# Patient Record
Sex: Male | Born: 1955 | Race: Black or African American | Hispanic: No | Marital: Married | Smoking: Current every day smoker
Health system: Southern US, Community
[De-identification: ages and names within clinical notes are randomized; demographics above are authoritative.]

---

## 2021-09-21 ENCOUNTER — Inpatient Hospital Stay (HOSPITAL_BASED_OUTPATIENT_CLINIC_OR_DEPARTMENT_OTHER)
Admission: EM | Admit: 2021-09-21 | Discharge: 2021-10-30 | DRG: 004 | Disposition: A | Discharge status: Other Acute Inpatient Hospital | Payer: Medicare Other | Attending: Internal Medicine | Admitting: Internal Medicine

## 2021-09-21 ENCOUNTER — Encounter (HOSPITAL_BASED_OUTPATIENT_CLINIC_OR_DEPARTMENT_OTHER): Payer: Self-pay | Admitting: Emergency Medicine

## 2021-09-21 ENCOUNTER — Emergency Department (HOSPITAL_BASED_OUTPATIENT_CLINIC_OR_DEPARTMENT_OTHER): Payer: Medicare Other

## 2021-09-21 ENCOUNTER — Other Ambulatory Visit: Payer: Self-pay

## 2021-09-21 DIAGNOSIS — R160 Hepatomegaly, not elsewhere classified: Secondary | ICD-10-CM | POA: Diagnosis present

## 2021-09-21 DIAGNOSIS — A403 Sepsis due to Streptococcus pneumoniae: Secondary | ICD-10-CM | POA: Diagnosis not present

## 2021-09-21 DIAGNOSIS — E785 Hyperlipidemia, unspecified: Secondary | ICD-10-CM | POA: Diagnosis present

## 2021-09-21 DIAGNOSIS — B9689 Other specified bacterial agents as the cause of diseases classified elsewhere: Secondary | ICD-10-CM | POA: Diagnosis present

## 2021-09-21 DIAGNOSIS — R001 Bradycardia, unspecified: Secondary | ICD-10-CM | POA: Diagnosis not present

## 2021-09-21 DIAGNOSIS — I10 Essential (primary) hypertension: Secondary | ICD-10-CM | POA: Diagnosis present

## 2021-09-21 DIAGNOSIS — K7460 Unspecified cirrhosis of liver: Secondary | ICD-10-CM | POA: Diagnosis present

## 2021-09-21 DIAGNOSIS — Z4659 Encounter for fitting and adjustment of other gastrointestinal appliance and device: Secondary | ICD-10-CM

## 2021-09-21 DIAGNOSIS — R64 Cachexia: Secondary | ICD-10-CM | POA: Diagnosis present

## 2021-09-21 DIAGNOSIS — D696 Thrombocytopenia, unspecified: Secondary | ICD-10-CM | POA: Diagnosis present

## 2021-09-21 DIAGNOSIS — K567 Ileus, unspecified: Secondary | ICD-10-CM

## 2021-09-21 DIAGNOSIS — Z930 Tracheostomy status: Secondary | ICD-10-CM

## 2021-09-21 DIAGNOSIS — J80 Acute respiratory distress syndrome: Secondary | ICD-10-CM | POA: Diagnosis not present

## 2021-09-21 DIAGNOSIS — A419 Sepsis, unspecified organism: Secondary | ICD-10-CM

## 2021-09-21 DIAGNOSIS — F419 Anxiety disorder, unspecified: Secondary | ICD-10-CM | POA: Diagnosis not present

## 2021-09-21 DIAGNOSIS — F17210 Nicotine dependence, cigarettes, uncomplicated: Secondary | ICD-10-CM | POA: Diagnosis present

## 2021-09-21 DIAGNOSIS — B1920 Unspecified viral hepatitis C without hepatic coma: Secondary | ICD-10-CM | POA: Diagnosis present

## 2021-09-21 DIAGNOSIS — D6489 Other specified anemias: Secondary | ICD-10-CM | POA: Diagnosis present

## 2021-09-21 DIAGNOSIS — E876 Hypokalemia: Secondary | ICD-10-CM | POA: Diagnosis not present

## 2021-09-21 DIAGNOSIS — Z66 Do not resuscitate: Secondary | ICD-10-CM | POA: Diagnosis not present

## 2021-09-21 DIAGNOSIS — K921 Melena: Secondary | ICD-10-CM | POA: Diagnosis not present

## 2021-09-21 DIAGNOSIS — I214 Non-ST elevation (NSTEMI) myocardial infarction: Secondary | ICD-10-CM

## 2021-09-21 DIAGNOSIS — Z0189 Encounter for other specified special examinations: Secondary | ICD-10-CM

## 2021-09-21 DIAGNOSIS — R7881 Bacteremia: Secondary | ICD-10-CM | POA: Diagnosis present

## 2021-09-21 DIAGNOSIS — G9341 Metabolic encephalopathy: Secondary | ICD-10-CM | POA: Diagnosis present

## 2021-09-21 DIAGNOSIS — J9600 Acute respiratory failure, unspecified whether with hypoxia or hypercapnia: Secondary | ICD-10-CM

## 2021-09-21 DIAGNOSIS — K7200 Acute and subacute hepatic failure without coma: Secondary | ICD-10-CM | POA: Diagnosis not present

## 2021-09-21 DIAGNOSIS — E43 Unspecified severe protein-calorie malnutrition: Secondary | ICD-10-CM | POA: Diagnosis present

## 2021-09-21 DIAGNOSIS — Z9911 Dependence on respirator [ventilator] status: Secondary | ICD-10-CM

## 2021-09-21 DIAGNOSIS — R6521 Severe sepsis with septic shock: Secondary | ICD-10-CM | POA: Diagnosis present

## 2021-09-21 DIAGNOSIS — D638 Anemia in other chronic diseases classified elsewhere: Secondary | ICD-10-CM | POA: Diagnosis present

## 2021-09-21 DIAGNOSIS — E871 Hypo-osmolality and hyponatremia: Secondary | ICD-10-CM | POA: Diagnosis not present

## 2021-09-21 DIAGNOSIS — J690 Pneumonitis due to inhalation of food and vomit: Secondary | ICD-10-CM | POA: Diagnosis present

## 2021-09-21 DIAGNOSIS — M109 Gout, unspecified: Secondary | ICD-10-CM | POA: Diagnosis present

## 2021-09-21 DIAGNOSIS — J13 Pneumonia due to Streptococcus pneumoniae: Secondary | ICD-10-CM | POA: Diagnosis present

## 2021-09-21 DIAGNOSIS — I21A1 Myocardial infarction type 2: Secondary | ICD-10-CM | POA: Diagnosis present

## 2021-09-21 DIAGNOSIS — J9601 Acute respiratory failure with hypoxia: Principal | ICD-10-CM

## 2021-09-21 DIAGNOSIS — R7989 Other specified abnormal findings of blood chemistry: Secondary | ICD-10-CM

## 2021-09-21 DIAGNOSIS — K922 Gastrointestinal hemorrhage, unspecified: Secondary | ICD-10-CM

## 2021-09-21 DIAGNOSIS — D62 Acute posthemorrhagic anemia: Secondary | ICD-10-CM | POA: Diagnosis not present

## 2021-09-21 DIAGNOSIS — J9811 Atelectasis: Secondary | ICD-10-CM | POA: Diagnosis not present

## 2021-09-21 DIAGNOSIS — Z452 Encounter for adjustment and management of vascular access device: Secondary | ICD-10-CM

## 2021-09-21 DIAGNOSIS — B182 Chronic viral hepatitis C: Secondary | ICD-10-CM | POA: Diagnosis present

## 2021-09-21 DIAGNOSIS — Z20822 Contact with and (suspected) exposure to covid-19: Secondary | ICD-10-CM | POA: Diagnosis present

## 2021-09-21 DIAGNOSIS — E878 Other disorders of electrolyte and fluid balance, not elsewhere classified: Secondary | ICD-10-CM | POA: Diagnosis present

## 2021-09-21 DIAGNOSIS — E870 Hyperosmolality and hypernatremia: Secondary | ICD-10-CM | POA: Diagnosis not present

## 2021-09-21 DIAGNOSIS — M1711 Unilateral primary osteoarthritis, right knee: Secondary | ICD-10-CM | POA: Diagnosis present

## 2021-09-21 DIAGNOSIS — R609 Edema, unspecified: Secondary | ICD-10-CM

## 2021-09-21 DIAGNOSIS — R339 Retention of urine, unspecified: Secondary | ICD-10-CM | POA: Diagnosis not present

## 2021-09-21 DIAGNOSIS — D61818 Other pancytopenia: Secondary | ICD-10-CM | POA: Diagnosis present

## 2021-09-21 DIAGNOSIS — J189 Pneumonia, unspecified organism: Secondary | ICD-10-CM

## 2021-09-21 DIAGNOSIS — K2211 Ulcer of esophagus with bleeding: Secondary | ICD-10-CM | POA: Diagnosis present

## 2021-09-21 DIAGNOSIS — R0600 Dyspnea, unspecified: Secondary | ICD-10-CM

## 2021-09-21 DIAGNOSIS — J90 Pleural effusion, not elsewhere classified: Secondary | ICD-10-CM | POA: Diagnosis not present

## 2021-09-21 DIAGNOSIS — Z01818 Encounter for other preprocedural examination: Secondary | ICD-10-CM

## 2021-09-21 DIAGNOSIS — R0602 Shortness of breath: Secondary | ICD-10-CM

## 2021-09-21 DIAGNOSIS — D649 Anemia, unspecified: Secondary | ICD-10-CM | POA: Diagnosis present

## 2021-09-21 DIAGNOSIS — J398 Other specified diseases of upper respiratory tract: Secondary | ICD-10-CM

## 2021-09-21 DIAGNOSIS — E46 Unspecified protein-calorie malnutrition: Secondary | ICD-10-CM

## 2021-09-21 DIAGNOSIS — K2101 Gastro-esophageal reflux disease with esophagitis, with bleeding: Secondary | ICD-10-CM | POA: Diagnosis present

## 2021-09-21 DIAGNOSIS — J9690 Respiratory failure, unspecified, unspecified whether with hypoxia or hypercapnia: Secondary | ICD-10-CM

## 2021-09-21 DIAGNOSIS — L8990 Pressure ulcer of unspecified site, unspecified stage: Secondary | ICD-10-CM | POA: Diagnosis present

## 2021-09-21 DIAGNOSIS — D509 Iron deficiency anemia, unspecified: Secondary | ICD-10-CM | POA: Diagnosis present

## 2021-09-21 DIAGNOSIS — R0682 Tachypnea, not elsewhere classified: Secondary | ICD-10-CM

## 2021-09-21 DIAGNOSIS — Z978 Presence of other specified devices: Secondary | ICD-10-CM

## 2021-09-21 DIAGNOSIS — B953 Streptococcus pneumoniae as the cause of diseases classified elsewhere: Secondary | ICD-10-CM | POA: Diagnosis present

## 2021-09-21 DIAGNOSIS — R0902 Hypoxemia: Secondary | ICD-10-CM

## 2021-09-21 DIAGNOSIS — R739 Hyperglycemia, unspecified: Secondary | ICD-10-CM | POA: Diagnosis not present

## 2021-09-21 DIAGNOSIS — E861 Hypovolemia: Secondary | ICD-10-CM | POA: Diagnosis not present

## 2021-09-21 DIAGNOSIS — Z515 Encounter for palliative care: Secondary | ICD-10-CM

## 2021-09-21 DIAGNOSIS — Z681 Body mass index (BMI) 19 or less, adult: Secondary | ICD-10-CM

## 2021-09-21 DIAGNOSIS — J440 Chronic obstructive pulmonary disease with acute lower respiratory infection: Secondary | ICD-10-CM | POA: Diagnosis present

## 2021-09-21 DIAGNOSIS — E8729 Other acidosis: Secondary | ICD-10-CM | POA: Diagnosis not present

## 2021-09-21 DIAGNOSIS — R54 Age-related physical debility: Secondary | ICD-10-CM | POA: Diagnosis present

## 2021-09-21 DIAGNOSIS — E875 Hyperkalemia: Secondary | ICD-10-CM | POA: Diagnosis not present

## 2021-09-21 LAB — I-STAT ARTERIAL BLOOD GAS, ED
Acid-Base Excess: 1 mmol/L (ref 0.0–2.0)
Acid-base deficit: 1 mmol/L (ref 0.0–2.0)
Bicarbonate: 22.7 mmol/L (ref 20.0–28.0)
Bicarbonate: 25 mmol/L (ref 20.0–28.0)
Calcium, Ion: 1.23 mmol/L (ref 1.15–1.40)
Calcium, Ion: 1.21 mmol/L (ref 1.15–1.40)
HCT: 40 % (ref 39.0–52.0)
HCT: 37 % — ABNORMAL LOW (ref 39.0–52.0)
Hemoglobin: 13.6 g/dL (ref 13.0–17.0)
Hemoglobin: 12.6 g/dL — ABNORMAL LOW (ref 13.0–17.0)
O2 Saturation: 98 %
O2 Saturation: 97 %
Patient temperature: 101
Patient temperature: 99.1
Potassium: 3.3 mmol/L — ABNORMAL LOW (ref 3.5–5.1)
Potassium: 3.2 mmol/L — ABNORMAL LOW (ref 3.5–5.1)
Sodium: 132 mmol/L — ABNORMAL LOW (ref 135–145)
Sodium: 132 mmol/L — ABNORMAL LOW (ref 135–145)
TCO2: 24 mmol/L (ref 22–32)
TCO2: 26 mmol/L (ref 22–32)
pCO2 arterial: 34.9 mmHg (ref 32–48)
pCO2 arterial: 36.6 mmHg (ref 32–48)
pH, Arterial: 7.427 (ref 7.35–7.45)
pH, Arterial: 7.443 (ref 7.35–7.45)
pO2, Arterial: 101 mmHg (ref 83–108)
pO2, Arterial: 83 mmHg (ref 83–108)

## 2021-09-21 LAB — COMPREHENSIVE METABOLIC PANEL
ALT: 78 U/L — ABNORMAL HIGH (ref 0–44)
AST: 178 U/L — ABNORMAL HIGH (ref 15–41)
Albumin: 2.7 g/dL — ABNORMAL LOW (ref 3.5–5.0)
Alkaline Phosphatase: 50 U/L (ref 38–126)
Anion gap: 14 (ref 5–15)
BUN: 28 mg/dL — ABNORMAL HIGH (ref 8–23)
CO2: 21 mmol/L — ABNORMAL LOW (ref 22–32)
Calcium: 9.1 mg/dL (ref 8.9–10.3)
Chloride: 94 mmol/L — ABNORMAL LOW (ref 98–111)
Creatinine, Ser: 0.93 mg/dL (ref 0.61–1.24)
GFR, Estimated: >60 mL/min (ref >60)
Glucose, Bld: 121 mg/dL — ABNORMAL HIGH (ref 70–99)
Potassium: 3.3 mmol/L — ABNORMAL LOW (ref 3.5–5.1)
Sodium: 129 mmol/L — ABNORMAL LOW (ref 135–145)
Total Bilirubin: 1.9 mg/dL — ABNORMAL HIGH (ref 0.3–1.2)
Total Protein: 7.4 g/dL (ref 6.5–8.1)

## 2021-09-21 LAB — LACTIC ACID, PLASMA
Lactic Acid, Venous: 4.3 mmol/L (ref 0.5–1.9)
Lactic Acid, Venous: 2.9 mmol/L (ref 0.5–1.9)

## 2021-09-21 LAB — APTT: aPTT: 25 seconds (ref 24–36)

## 2021-09-21 LAB — RESP PANEL BY RT-PCR (FLU A&B, COVID) ARPGX2
Influenza A by PCR: NEGATIVE
Influenza B by PCR: NEGATIVE
SARS Coronavirus 2 by RT PCR: NEGATIVE

## 2021-09-21 LAB — PROTIME-INR
INR: 1 (ref 0.8–1.2)
Prothrombin Time: 13.5 seconds (ref 11.4–15.2)

## 2021-09-21 MED ORDER — ACETAMINOPHEN 500 MG PO TABS: 1000.0000 mg | ORAL_TABLET | Freq: Once | ORAL | Status: DC

## 2021-09-21 MED ORDER — SODIUM CHLORIDE 0.9 % IV BOLUS
500.0000 mL | Freq: Once | INTRAVENOUS | Status: AC
  Administered 2021-09-21: 500 mL via INTRAVENOUS

## 2021-09-21 MED ORDER — LORAZEPAM 2 MG/ML IJ SOLN
1.0000 mg | Freq: Once | INTRAMUSCULAR | Status: AC
  Administered 2021-09-21: 1 mg via INTRAVENOUS
  Filled 2021-09-21: qty 1

## 2021-09-21 MED ORDER — ACETAMINOPHEN 650 MG RE SUPP
650.0000 mg | Freq: Once | RECTAL | Status: AC
  Administered 2021-09-21: 650 mg via RECTAL
  Filled 2021-09-21: qty 1

## 2021-09-21 MED ORDER — SODIUM CHLORIDE 0.9 % IV SOLN
500.0000 mg | INTRAVENOUS | Status: DC
  Administered 2021-09-21: 500 mg via INTRAVENOUS
  Filled 2021-09-21: qty 5

## 2021-09-21 MED ORDER — SODIUM CHLORIDE 0.9 % IV SOLN
2.0000 g | INTRAVENOUS | Status: DC
  Administered 2021-09-21 – 2021-09-22 (×2): 2 g via INTRAVENOUS
  Filled 2021-09-21 (×2): qty 20

## 2021-09-21 MED ORDER — LACTATED RINGERS IV BOLUS
1000.0000 mL | Freq: Once | INTRAVENOUS | Status: AC
  Administered 2021-09-21: 1000 mL via INTRAVENOUS

## 2021-09-21 NOTE — ED Triage Notes (Signed)
Pt arrives pov with c/o shob x 1 week, worsening today. 101.8 axillary temp in triage

## 2021-09-21 NOTE — Sepsis Progress Note (Signed)
Following for sepsis monitoring ?

## 2021-09-21 NOTE — ED Provider Notes (Signed)
[place] EMERGENCY DEPARTMENT Provider Note   CSN: [id number] Arrival date & time: 09/21/21  1837     History  Chief Complaint  Patient presents with   Shortness of Breath    Troy Sampson is a 66 y.o. male.  Patient is a 66 year old male who presents with shortness of breath.  Per chart review, he has a history of duodenal ulcers status post GI bleed and iron deficiency anemia.  He denies any past history of lung disease or cardiac problems.  He reports a 1 week history of worsening shortness of breath.  He has felt generally weak.  He has some pain in his left chest which is worse with movement and deep breathing.  He says he has a cough which is productive of some brown sputum.  He is noted to have a fever of 101.8 here in the ED.  He denies any leg swelling.  No vomiting or diarrhea.  He has noted some orange-colored stools.      Home Medications Prior to Admission medications   Not on File      Allergies    Patient has no allergy information on record.    Review of Systems   Review of Systems  Constitutional:  Positive for fatigue and fever. Negative for chills and diaphoresis.  HENT:  Negative for congestion, rhinorrhea and sneezing.   Eyes: Negative.   Respiratory:  Positive for cough and shortness of breath. Negative for chest tightness.   Cardiovascular:  Positive for chest pain. Negative for leg swelling.  Gastrointestinal:  Negative for abdominal pain, blood in stool, diarrhea, nausea and vomiting.  Genitourinary:  Negative for difficulty urinating, flank pain, frequency and hematuria.  Musculoskeletal:  Negative for arthralgias and back pain.  Skin:  Negative for rash.  Neurological:  Negative for dizziness, speech difficulty, weakness, numbness and headaches.   Physical Exam Updated Vital Signs BP 133/86    Pulse (!) 135    Temp 99.8 F (37.7 C)    Resp (!) 33    Ht 5\' 7"  (1.702 m)    Wt 46.3 kg    SpO2 98%    BMI 15.98 kg/m  Physical  Exam Constitutional:      General: He is in acute distress.     Appearance: He is well-developed. He is ill-appearing.  HENT:     Head: Normocephalic and atraumatic.  Eyes:     Pupils: Pupils are equal, round, and reactive to light.  Cardiovascular:     Rate and Rhythm: Normal rate and regular rhythm.     Heart sounds: Normal heart sounds.  Pulmonary:     Effort: Tachypnea, accessory muscle usage and respiratory distress present.     Breath sounds: Rhonchi present. No wheezing or rales.  Chest:     Chest wall: No tenderness.  Abdominal:     General: Bowel sounds are normal.     Palpations: Abdomen is soft.     Tenderness: There is no abdominal tenderness. There is no guarding or rebound.  Musculoskeletal:        General: Normal range of motion.     Cervical back: Normal range of motion and neck supple.     Comments: No edema or calf tenderness  Lymphadenopathy:     Cervical: No cervical adenopathy.  Skin:    General: Skin is warm and dry.     Findings: No rash.  Neurological:     Mental Status: He is alert and oriented to person, place,  and time.    ED Results / Procedures / Treatments   Labs (all labs ordered are listed, but only abnormal results are displayed) Labs Reviewed  LACTIC ACID, PLASMA - Abnormal; Notable for the following components:      Result Value   Lactic Acid, Venous 4.3 (*)    All other components within normal limits  LACTIC ACID, PLASMA - Abnormal; Notable for the following components:   Lactic Acid, Venous 2.9 (*)    All other components within normal limits  COMPREHENSIVE METABOLIC PANEL - Abnormal; Notable for the following components:   Sodium 129 (*)    Potassium 3.3 (*)    Chloride 94 (*)    CO2 21 (*)    Glucose, Bld 121 (*)    BUN 28 (*)    Albumin 2.7 (*)    AST 178 (*)    ALT 78 (*)    Total Bilirubin 1.9 (*)    All other components within normal limits  CBC WITH DIFFERENTIAL/PLATELET - Abnormal; Notable for the following  components:   WBC 2.5 (*)    RBC 3.98 (*)    HCT 38.1 (*)    MCH 34.9 (*)    MCHC 36.5 (*)    Platelets 76 (*)    Lymphs Abs 0.1 (*)    Monocytes Absolute 0.0 (*)    All other components within normal limits  DIC (DISSEMINATED INTRAVASCULAR COAGULATION)PANEL - Abnormal; Notable for the following components:   D-Dimer, Quant 3.59 (*)    Platelets 76 (*)    All other components within normal limits  I-STAT ARTERIAL BLOOD GAS, ED - Abnormal; Notable for the following components:   Sodium 132 (*)    Potassium 3.3 (*)    All other components within normal limits  I-STAT ARTERIAL BLOOD GAS, ED - Abnormal; Notable for the following components:   Sodium 132 (*)    Potassium 3.2 (*)    HCT 37.0 (*)    Hemoglobin 12.6 (*)    All other components within normal limits  RESP PANEL BY RT-PCR (FLU A&B, COVID) ARPGX2  CULTURE, BLOOD (ROUTINE X 2)  CULTURE, BLOOD (ROUTINE X 2)  URINE CULTURE  PROTIME-INR  APTT  URINALYSIS, ROUTINE W REFLEX MICROSCOPIC  PATHOLOGIST SMEAR REVIEW    EKG EKG Interpretation  Date/Time:  Tuesday September 21 2021 18:51:31 EST Ventricular Rate:  144 PR Interval:  68 QRS Duration: 85 QT Interval:  273 QTC Calculation: 423 R Axis:   74 Text Interpretation: Sinus tachycardia LAE, consider biatrial enlargement LVH with secondary repolarization abnormality Anterior ST elevation, probably due to LVH No old tracing to compare Confirmed by Rolan BuccoBelfi, Karem Tomaso [phone]([id number]) on 09/21/2021 8:01:09 PM  Radiology DG Chest Portable 1 View  Result Date: 09/21/2021 CLINICAL DATA:  Shortness of breath EXAM: PORTABLE CHEST 1 VIEW COMPARISON:  None. FINDINGS: There is alveolar infiltrate in the left lower lung fields with air bronchogram. Left diaphragm is obscured. There is blunting of left lateral costophrenic angle. There is no pneumothorax. IMPRESSION: There is large infiltrate in the left lower lung fields suggesting pneumonia. Follow-up studies until complete clearing occurs should  be considered to rule out any obstructing lesions in the bronchus. Left pleural effusion. Electronically Signed   By: Ernie AvenaPalani  Rathinasamy M.D.   On: 09/21/2021 19:13    Procedures Procedures    Medications Ordered in ED Medications  cefTRIAXone (ROCEPHIN) 2 g in sodium chloride 0.9 % 100 mL IVPB (0 g Intravenous Stopped 09/21/21 2020)  azithromycin (ZITHROMAX) 500  mg in sodium chloride 0.9 % 250 mL IVPB (0 mg Intravenous Stopped 09/21/21 2031)  sodium chloride 0.9 % bolus 500 mL (0 mLs Intravenous Stopped 09/21/21 2021)  LORazepam (ATIVAN) injection 1 mg (1 mg Intravenous Given 09/21/21 1952)  lactated ringers bolus 1,000 mL ( Intravenous Stopped 09/21/21 2129)  acetaminophen (TYLENOL) suppository 650 mg (650 mg Rectal Given 09/21/21 2228)    ED Course/ Medical Decision Making/ A&P                           Medical Decision Making Amount and/or Complexity of Data Reviewed Labs: ordered. Radiology: ordered. ECG/medicine tests: ordered.  Risk OTC drugs. Prescription drug management. Decision regarding hospitalization.   Patient is a 66 year old male who presents with shortness of breath.  He was noted to be markedly hypoxic on arrival with oxygen saturations in the low 80s.  He was also fairly tachypneic with respiratory rates in the 40s as well as tachycardic.  He was noted to be febrile.  Portable chest x-ray was performed and interpreted by me to show a left lower lobe pneumonia.  No evidence of pneumothorax or significant pulmonary edema.  Patient was placed on BiPAP following this.  He did get fairly anxious on the BiPAP and was given a small dose of Ativan.  He actually seemed to get a little bit worse after the Ativan so this was not repeated.  However he did seem to calm down a bit and was doing better on the BiPAP.  ABG was performed which showed a normal pH.  Normal PCO2 and normal PO2.  He was weaned down to 60% FiO2.  His labs show a leukopenia and thrombocytopenia.  His hemoglobin  appears to be normal.  These were interpreted by me.  His lactate was markedly elevated at 4.3.  He was started on septic protocol and was given a full 30 cc/kg fluid bolus as well as IV antibiotics.  His heart rate and respiratory rate seemed to improve a little bit after treatment of his fever although he still tachypneic and tachycardic.  His EKG shows a sinus tachycardia without evidence of arrhythmias.  He does appear at this point to be doing okay on the BiPAP.  He is awake and communicative.  He still has some increased work of breathing but at this point does not need intubation.  I spoke with Dr.Ismael with the pulmonary critical care team who is excepted the patient for admission to [facility].  Patient was transported by [facility].  CRITICAL CARE Performed by: Malvin Johns Total critical care time: 70 minutes Critical care time was exclusive of separately billable procedures and treating other patients. Critical care was necessary to treat or prevent imminent or life-threatening deterioration. Critical care was time spent personally by me on the following activities: development of treatment plan with patient and/or surrogate as well as nursing, discussions with consultants, evaluation of patient's response to treatment, examination of patient, obtaining history from patient or surrogate, ordering and performing treatments and interventions, ordering and review of laboratory studies, ordering and review of radiographic studies, pulse oximetry and re-evaluation of patient's condition.   Final Clinical Impression(s) / ED Diagnoses Final diagnoses:  Acute respiratory failure with hypoxia ([place])  Community acquired pneumonia of left lower lobe of lung  Sepsis, due to unspecified organism, unspecified whether acute organ dysfunction present [facility])    Rx / DC Orders ED Discharge Orders     None  Malvin Johns, MD 09/21/21 [phone]

## 2021-09-21 NOTE — Plan of Care (Addendum)
Pt with severe sepsis from [facility]LL PNA.  Thrombocytopenia worrisome for DIC, will order DIC panel.  Leukopenia worrisome for overwhelming sepsis.  RR 45 despite BIPAP, has respiratory failure, worrisome for pending need for intubation.  HR still 141.  Hypertensive though.  Have advised EDP to consider PCCM consult.  Have a strong suspicion that this guy going end up in ICU before end of night with those numbers.  If they decline, ill put him in progressive.  Regardless, have asked carelink to expedite transfer to [facility] (be that ED to ICU, ED to progressive unit, or ED to ED).  TRH will assume care on arrival to accepting facility. Until arrival, care as per EDP. However, TRH available 24/7 for questions and assistance.  Nursing staff, please page [facility] Admits and Consults [phone]) as soon as the patient arrives to the hospital.

## 2021-09-21 NOTE — ED Notes (Signed)
Cleaned Pt. Bed and changed bed sheets on Pt. Bed.

## 2021-09-22 ENCOUNTER — Inpatient Hospital Stay (HOSPITAL_COMMUNITY): Payer: Medicare Other

## 2021-09-22 DIAGNOSIS — A403 Sepsis due to Streptococcus pneumoniae: Secondary | ICD-10-CM | POA: Diagnosis present

## 2021-09-22 DIAGNOSIS — E46 Unspecified protein-calorie malnutrition: Secondary | ICD-10-CM | POA: Insufficient documentation

## 2021-09-22 DIAGNOSIS — K2211 Ulcer of esophagus with bleeding: Secondary | ICD-10-CM | POA: Diagnosis present

## 2021-09-22 DIAGNOSIS — R64 Cachexia: Secondary | ICD-10-CM | POA: Diagnosis present

## 2021-09-22 DIAGNOSIS — D61818 Other pancytopenia: Secondary | ICD-10-CM | POA: Diagnosis present

## 2021-09-22 DIAGNOSIS — J9600 Acute respiratory failure, unspecified whether with hypoxia or hypercapnia: Secondary | ICD-10-CM | POA: Insufficient documentation

## 2021-09-22 DIAGNOSIS — J9601 Acute respiratory failure with hypoxia: Principal | ICD-10-CM | POA: Diagnosis present

## 2021-09-22 DIAGNOSIS — J189 Pneumonia, unspecified organism: Secondary | ICD-10-CM

## 2021-09-22 DIAGNOSIS — I21A1 Myocardial infarction type 2: Secondary | ICD-10-CM | POA: Diagnosis present

## 2021-09-22 DIAGNOSIS — E8729 Other acidosis: Secondary | ICD-10-CM | POA: Diagnosis not present

## 2021-09-22 DIAGNOSIS — Z681 Body mass index (BMI) 19 or less, adult: Secondary | ICD-10-CM | POA: Diagnosis not present

## 2021-09-22 DIAGNOSIS — I10 Essential (primary) hypertension: Secondary | ICD-10-CM | POA: Diagnosis present

## 2021-09-22 DIAGNOSIS — Z66 Do not resuscitate: Secondary | ICD-10-CM | POA: Diagnosis not present

## 2021-09-22 DIAGNOSIS — R6521 Severe sepsis with septic shock: Secondary | ICD-10-CM | POA: Diagnosis present

## 2021-09-22 DIAGNOSIS — E43 Unspecified severe protein-calorie malnutrition: Secondary | ICD-10-CM | POA: Insufficient documentation

## 2021-09-22 DIAGNOSIS — D638 Anemia in other chronic diseases classified elsewhere: Secondary | ICD-10-CM | POA: Diagnosis present

## 2021-09-22 DIAGNOSIS — K7200 Acute and subacute hepatic failure without coma: Secondary | ICD-10-CM | POA: Diagnosis not present

## 2021-09-22 DIAGNOSIS — J80 Acute respiratory distress syndrome: Secondary | ICD-10-CM | POA: Diagnosis not present

## 2021-09-22 DIAGNOSIS — J90 Pleural effusion, not elsewhere classified: Secondary | ICD-10-CM | POA: Diagnosis not present

## 2021-09-22 DIAGNOSIS — Z930 Tracheostomy status: Secondary | ICD-10-CM | POA: Diagnosis not present

## 2021-09-22 DIAGNOSIS — J13 Pneumonia due to Streptococcus pneumoniae: Secondary | ICD-10-CM | POA: Diagnosis present

## 2021-09-22 DIAGNOSIS — I214 Non-ST elevation (NSTEMI) myocardial infarction: Secondary | ICD-10-CM | POA: Diagnosis not present

## 2021-09-22 DIAGNOSIS — Z20822 Contact with and (suspected) exposure to covid-19: Secondary | ICD-10-CM | POA: Diagnosis present

## 2021-09-22 DIAGNOSIS — A419 Sepsis, unspecified organism: Secondary | ICD-10-CM

## 2021-09-22 DIAGNOSIS — Z515 Encounter for palliative care: Secondary | ICD-10-CM | POA: Diagnosis not present

## 2021-09-22 DIAGNOSIS — D62 Acute posthemorrhagic anemia: Secondary | ICD-10-CM | POA: Diagnosis not present

## 2021-09-22 DIAGNOSIS — R9431 Abnormal electrocardiogram [ECG] [EKG]: Secondary | ICD-10-CM

## 2021-09-22 DIAGNOSIS — B182 Chronic viral hepatitis C: Secondary | ICD-10-CM | POA: Diagnosis present

## 2021-09-22 DIAGNOSIS — J440 Chronic obstructive pulmonary disease with acute lower respiratory infection: Secondary | ICD-10-CM | POA: Diagnosis present

## 2021-09-22 DIAGNOSIS — J690 Pneumonitis due to inhalation of food and vomit: Secondary | ICD-10-CM | POA: Diagnosis present

## 2021-09-22 DIAGNOSIS — G9341 Metabolic encephalopathy: Secondary | ICD-10-CM | POA: Diagnosis present

## 2021-09-22 DIAGNOSIS — K2101 Gastro-esophageal reflux disease with esophagitis, with bleeding: Secondary | ICD-10-CM | POA: Diagnosis present

## 2021-09-22 LAB — CBC WITH DIFFERENTIAL/PLATELET
Abs Immature Granulocytes: 0.02 10*3/uL (ref 0.00–0.07)
Band Neutrophils: 15 %
Basophils Absolute: 0 10*3/uL (ref 0.0–0.1)
Basophils Relative: 0 %
Eosinophils Absolute: 0 10*3/uL (ref 0.0–0.5)
Eosinophils Relative: 0 %
HCT: 38.1 % — ABNORMAL LOW (ref 39.0–52.0)
Hemoglobin: 13.9 g/dL (ref 13.0–17.0)
Immature Granulocytes: 1 %
Lymphocytes Relative: 3 %
Lymphs Abs: 0.1 10*3/uL — ABNORMAL LOW (ref 0.7–4.0)
MCH: 34.9 pg — ABNORMAL HIGH (ref 26.0–34.0)
MCHC: 36.5 g/dL — ABNORMAL HIGH (ref 30.0–36.0)
MCV: 95.7 fL (ref 80.0–100.0)
Metamyelocytes Relative: 1 %
Monocytes Absolute: 0 10*3/uL — ABNORMAL LOW (ref 0.1–1.0)
Monocytes Relative: 4 %
Myelocytes: 1 %
Neutro Abs: 2.4 10*3/uL (ref 1.7–7.7)
Neutrophils Relative %: 76 %
Platelets: 76 10*3/uL — ABNORMAL LOW (ref 150–400)
RBC: 3.98 MIL/uL — ABNORMAL LOW (ref 4.22–5.81)
RDW: 13.2 % (ref 11.5–15.5)
Smear Review: DECREASED
WBC: 2.5 10*3/uL — ABNORMAL LOW (ref 4.0–10.5)
nRBC: 0 % (ref 0.0–0.2)

## 2021-09-22 LAB — BLOOD CULTURE ID PANEL (REFLEXED) - BCID2

## 2021-09-22 LAB — ECHOCARDIOGRAM COMPLETE
AR max vel: 2.05 cm2
AV Area VTI: 2.49 cm2
AV Area mean vel: 1.93 cm2
AV Mean grad: 4 mmHg
AV Peak grad: 7.2 mmHg
Ao pk vel: 1.34 m/s
Area-P 1/2: 4.41 cm2
Calc EF: 47.2 %
Height: 67 in
MV VTI: 4.3 cm2
S' Lateral: 3 cm
Single Plane A2C EF: 37 %
Single Plane A4C EF: 51.8 %
Weight: 1633.17 oz

## 2021-09-22 LAB — HEPATIC FUNCTION PANEL
ALT: 43 U/L (ref 0–44)
AST: 154 U/L — ABNORMAL HIGH (ref 15–41)
Albumin: <1.5 g/dL — ABNORMAL LOW (ref 3.5–5.0)
Alkaline Phosphatase: 35 U/L — ABNORMAL LOW (ref 38–126)
Bilirubin, Direct: 0.6 mg/dL — ABNORMAL HIGH (ref 0.0–0.2)
Indirect Bilirubin: 0.4 mg/dL (ref 0.3–0.9)
Total Bilirubin: 1 mg/dL (ref 0.3–1.2)
Total Protein: 5 g/dL — ABNORMAL LOW (ref 6.5–8.1)

## 2021-09-22 LAB — POCT I-STAT 7, (LYTES, BLD GAS, ICA,H+H)
Acid-base deficit: 3 mmol/L — ABNORMAL HIGH (ref 0.0–2.0)
Acid-base deficit: 3 mmol/L — ABNORMAL HIGH (ref 0.0–2.0)
Bicarbonate: 24.2 mmol/L (ref 20.0–28.0)
Bicarbonate: 23.4 mmol/L (ref 20.0–28.0)
Calcium, Ion: 1.29 mmol/L (ref 1.15–1.40)
Calcium, Ion: 1.28 mmol/L (ref 1.15–1.40)
HCT: 39 % (ref 39.0–52.0)
HCT: 31 % — ABNORMAL LOW (ref 39.0–52.0)
Hemoglobin: 13.3 g/dL (ref 13.0–17.0)
Hemoglobin: 10.5 g/dL — ABNORMAL LOW (ref 13.0–17.0)
O2 Saturation: 99 %
O2 Saturation: 93 %
Patient temperature: 99.4
Patient temperature: 98.6
Potassium: 3.3 mmol/L — ABNORMAL LOW (ref 3.5–5.1)
Potassium: 3.7 mmol/L (ref 3.5–5.1)
Sodium: 136 mmol/L (ref 135–145)
Sodium: 137 mmol/L (ref 135–145)
TCO2: 26 mmol/L (ref 22–32)
TCO2: 25 mmol/L (ref 22–32)
pCO2 arterial: 50.2 mmHg — ABNORMAL HIGH (ref 32–48)
pCO2 arterial: 45.4 mmHg (ref 32–48)
pH, Arterial: 7.293 — ABNORMAL LOW (ref 7.35–7.45)
pH, Arterial: 7.32 — ABNORMAL LOW (ref 7.35–7.45)
pO2, Arterial: 133 mmHg — ABNORMAL HIGH (ref 83–108)
pO2, Arterial: 74 mmHg — ABNORMAL LOW (ref 83–108)

## 2021-09-22 LAB — CBC
HCT: 36.2 % — ABNORMAL LOW (ref 39.0–52.0)
Hemoglobin: 12.9 g/dL — ABNORMAL LOW (ref 13.0–17.0)
MCH: 34.1 pg — ABNORMAL HIGH (ref 26.0–34.0)
MCHC: 35.6 g/dL (ref 30.0–36.0)
MCV: 95.8 fL (ref 80.0–100.0)
Platelets: 36 10*3/uL — ABNORMAL LOW (ref 150–400)
RBC: 3.78 MIL/uL — ABNORMAL LOW (ref 4.22–5.81)
RDW: 13.2 % (ref 11.5–15.5)
WBC: 1.8 10*3/uL — ABNORMAL LOW (ref 4.0–10.5)
nRBC: 0 % (ref 0.0–0.2)

## 2021-09-22 LAB — CK: Total CK: 588 U/L — ABNORMAL HIGH (ref 49–397)

## 2021-09-22 LAB — DIC (DISSEMINATED INTRAVASCULAR COAGULATION)PANEL
D-Dimer, Quant: 3.59 ug/mL-FEU — ABNORMAL HIGH (ref 0.00–0.50)
Fibrinogen: >800 mg/dL — ABNORMAL HIGH (ref 210–475)
INR: 1.1 (ref 0.8–1.2)
Platelets: 76 10*3/uL — ABNORMAL LOW (ref 150–400)
Prothrombin Time: 14 seconds (ref 11.4–15.2)
Smear Review: NONE SEEN
aPTT: 25 seconds (ref 24–36)

## 2021-09-22 LAB — LACTIC ACID, PLASMA
Lactic Acid, Venous: 3.7 mmol/L (ref 0.5–1.9)
Lactic Acid, Venous: 3.6 mmol/L (ref 0.5–1.9)
Lactic Acid, Venous: 4.2 mmol/L (ref 0.5–1.9)

## 2021-09-22 LAB — GLUCOSE, CAPILLARY
Glucose-Capillary: 110 mg/dL — ABNORMAL HIGH (ref 70–99)
Glucose-Capillary: 116 mg/dL — ABNORMAL HIGH (ref 70–99)
Glucose-Capillary: 121 mg/dL — ABNORMAL HIGH (ref 70–99)
Glucose-Capillary: 127 mg/dL — ABNORMAL HIGH (ref 70–99)
Glucose-Capillary: 134 mg/dL — ABNORMAL HIGH (ref 70–99)
Glucose-Capillary: 70 mg/dL (ref 70–99)
Glucose-Capillary: 79 mg/dL (ref 70–99)
Glucose-Capillary: 94 mg/dL (ref 70–99)

## 2021-09-22 LAB — URINALYSIS, ROUTINE W REFLEX MICROSCOPIC
Bilirubin Urine: NEGATIVE
Glucose, UA: NEGATIVE mg/dL
Ketones, ur: NEGATIVE mg/dL
Leukocytes,Ua: NEGATIVE
Nitrite: NEGATIVE
Protein, ur: 30 mg/dL — AB
Specific Gravity, Urine: 1.015 (ref 1.005–1.030)
pH: 5 (ref 5.0–8.0)

## 2021-09-22 LAB — HIV ANTIBODY (ROUTINE TESTING W REFLEX): HIV Screen 4th Generation wRfx: NONREACTIVE

## 2021-09-22 LAB — RAPID URINE DRUG SCREEN, HOSP PERFORMED
Amphetamines: NOT DETECTED
Barbiturates: NOT DETECTED
Benzodiazepines: NOT DETECTED
Cocaine: NOT DETECTED
Opiates: NOT DETECTED
Tetrahydrocannabinol: NOT DETECTED

## 2021-09-22 LAB — BASIC METABOLIC PANEL
Anion gap: 15 (ref 5–15)
BUN: 31 mg/dL — ABNORMAL HIGH (ref 8–23)
CO2: 21 mmol/L — ABNORMAL LOW (ref 22–32)
Calcium: 8.9 mg/dL (ref 8.9–10.3)
Chloride: 99 mmol/L (ref 98–111)
Creatinine, Ser: 1.03 mg/dL (ref 0.61–1.24)
GFR, Estimated: >60 mL/min (ref >60)
Glucose, Bld: 64 mg/dL — ABNORMAL LOW (ref 70–99)
Potassium: 3.4 mmol/L — ABNORMAL LOW (ref 3.5–5.1)
Sodium: 135 mmol/L (ref 135–145)

## 2021-09-22 LAB — MAGNESIUM
Magnesium: 2.1 mg/dL (ref 1.7–2.4)
Magnesium: 1.7 mg/dL (ref 1.7–2.4)

## 2021-09-22 LAB — TROPONIN I (HIGH SENSITIVITY)
Troponin I (High Sensitivity): 136 ng/L (ref <18)
Troponin I (High Sensitivity): 108 ng/L (ref <18)

## 2021-09-22 LAB — PHOSPHORUS: Phosphorus: 1.4 mg/dL — ABNORMAL LOW (ref 2.5–4.6)

## 2021-09-22 LAB — BRAIN NATRIURETIC PEPTIDE: B Natriuretic Peptide: 471.4 pg/mL — ABNORMAL HIGH (ref 0.0–100.0)

## 2021-09-22 LAB — STREP PNEUMONIAE URINARY ANTIGEN: Strep Pneumo Urinary Antigen: POSITIVE — AB

## 2021-09-22 LAB — MRSA NEXT GEN BY PCR, NASAL: MRSA by PCR Next Gen: NOT DETECTED

## 2021-09-22 MED ORDER — POLYETHYLENE GLYCOL 3350 17 G PO PACK: 17.0000 g | PACK | Freq: Every day | ORAL | Status: DC | PRN

## 2021-09-22 MED ORDER — FENTANYL BOLUS VIA INFUSION
25.0000 ug | INTRAVENOUS | Status: DC | PRN
  Administered 2021-09-22 (×3): 50 ug via INTRAVENOUS
  Administered 2021-09-22: 75 ug via INTRAVENOUS
  Administered 2021-09-22 (×3): 50 ug via INTRAVENOUS
  Administered 2021-09-23: 75 ug via INTRAVENOUS
  Administered 2021-09-23 (×3): 50 ug via INTRAVENOUS
  Administered 2021-09-23: 75 ug via INTRAVENOUS
  Administered 2021-09-23 (×2): 50 ug via INTRAVENOUS
  Administered 2021-09-24 – 2021-09-25 (×5): 100 ug via INTRAVENOUS
  Administered 2021-09-26 (×2): 50 ug via INTRAVENOUS
  Administered 2021-09-26: 100 ug via INTRAVENOUS
  Administered 2021-09-26 (×2): 50 ug via INTRAVENOUS
  Administered 2021-09-26 – 2021-09-28 (×3): 100 ug via INTRAVENOUS
  Administered 2021-09-28 (×3): 50 ug via INTRAVENOUS
  Administered 2021-09-29 (×2): 100 ug via INTRAVENOUS
  Administered 2021-09-29: 50 ug via INTRAVENOUS
  Administered 2021-09-29 – 2021-10-03 (×7): 100 ug via INTRAVENOUS
  Administered 2021-10-03: 75 ug via INTRAVENOUS
  Administered 2021-10-03: 100 ug via INTRAVENOUS
  Filled 2021-09-22: qty 100

## 2021-09-22 MED ORDER — FUROSEMIDE 10 MG/ML IJ SOLN
60.0000 mg | Freq: Once | INTRAMUSCULAR | Status: AC
  Administered 2021-09-22: 60 mg via INTRAVENOUS
  Filled 2021-09-22: qty 6

## 2021-09-22 MED ORDER — NICOTINE 21 MG/24HR TD PT24
21.0000 mg | MEDICATED_PATCH | Freq: Every day | TRANSDERMAL | Status: DC
  Administered 2021-09-22 – 2021-09-24 (×3): 21 mg via TRANSDERMAL
  Filled 2021-09-22 (×3): qty 1

## 2021-09-22 MED ORDER — LACTATED RINGERS IV BOLUS
1000.0000 mL | Freq: Once | INTRAVENOUS | Status: AC
  Administered 2021-09-22: 1000 mL via INTRAVENOUS

## 2021-09-22 MED ORDER — PROPOFOL 1000 MG/100ML IV EMUL
5.0000 ug/kg/min | INTRAVENOUS | Status: DC
  Administered 2021-09-22: 15 ug/kg/min via INTRAVENOUS
  Administered 2021-09-22: 5 ug/kg/min via INTRAVENOUS
  Administered 2021-09-23: 20 ug/kg/min via INTRAVENOUS
  Administered 2021-09-23: 50 ug/kg/min via INTRAVENOUS
  Administered 2021-09-23: 40 ug/kg/min via INTRAVENOUS
  Administered 2021-09-24: 50 ug/kg/min via INTRAVENOUS
  Administered 2021-09-24 (×2): 60 ug/kg/min via INTRAVENOUS
  Administered 2021-09-24: 50 ug/kg/min via INTRAVENOUS
  Administered 2021-09-25: 40 ug/kg/min via INTRAVENOUS
  Administered 2021-09-25: 30 ug/kg/min via INTRAVENOUS
  Administered 2021-09-25 – 2021-09-26 (×3): 40 ug/kg/min via INTRAVENOUS
  Filled 2021-09-22: qty 100
  Filled 2021-09-22: qty 200
  Filled 2021-09-22 (×6): qty 100
  Filled 2021-09-22: qty 200
  Filled 2021-09-22 (×3): qty 100

## 2021-09-22 MED ORDER — CHLORHEXIDINE GLUCONATE CLOTH 2 % EX PADS
6.0000 | MEDICATED_PAD | Freq: Every day | CUTANEOUS | Status: DC
  Administered 2021-09-22 – 2021-10-28 (×41): 6 via TOPICAL

## 2021-09-22 MED ORDER — ACETAMINOPHEN 160 MG/5ML PO SOLN
650.0000 mg | Freq: Four times a day (QID) | ORAL | Status: DC | PRN
  Administered 2021-09-22 – 2021-10-09 (×12): 650 mg
  Filled 2021-09-22 (×14): qty 20.3

## 2021-09-22 MED ORDER — LACTATED RINGERS IV SOLN: INTRAVENOUS | Status: DC

## 2021-09-22 MED ORDER — CHLORHEXIDINE GLUCONATE 0.12% ORAL RINSE (MEDLINE KIT)
15.0000 mL | Freq: Two times a day (BID) | OROMUCOSAL | Status: DC
  Administered 2021-09-22 – 2021-10-26 (×68): 15 mL via OROMUCOSAL

## 2021-09-22 MED ORDER — PANTOPRAZOLE 2 MG/ML SUSPENSION: 40.0000 mg | Freq: Every day | ORAL | Status: DC

## 2021-09-22 MED ORDER — SODIUM CHLORIDE 0.9 % IV SOLN: 500.0000 mg | INTRAVENOUS | Status: DC

## 2021-09-22 MED ORDER — MIDAZOLAM HCL 2 MG/2ML IJ SOLN
INTRAMUSCULAR | Status: AC
  Filled 2021-09-22: qty 2

## 2021-09-22 MED ORDER — ETOMIDATE 2 MG/ML IV SOLN
INTRAVENOUS | Status: AC
  Filled 2021-09-22: qty 20

## 2021-09-22 MED ORDER — ASPIRIN 81 MG PO CHEW
81.0000 mg | CHEWABLE_TABLET | Freq: Every day | ORAL | Status: DC
  Administered 2021-09-22: 81 mg
  Filled 2021-09-22: qty 1

## 2021-09-22 MED ORDER — POTASSIUM CHLORIDE 20 MEQ PO PACK
40.0000 meq | PACK | Freq: Once | ORAL | Status: AC
  Administered 2021-09-22: 40 meq
  Filled 2021-09-22: qty 2

## 2021-09-22 MED ORDER — THIAMINE HCL 100 MG PO TABS
100.0000 mg | ORAL_TABLET | Freq: Every day | ORAL | Status: DC
  Administered 2021-09-22 – 2021-09-30 (×9): 100 mg
  Filled 2021-09-22 (×9): qty 1

## 2021-09-22 MED ORDER — DOCUSATE SODIUM 50 MG/5ML PO LIQD: 100.0000 mg | Freq: Two times a day (BID) | ORAL | Status: DC | PRN

## 2021-09-22 MED ORDER — DOCUSATE SODIUM 100 MG PO CAPS: 100.0000 mg | ORAL_CAPSULE | Freq: Two times a day (BID) | ORAL | Status: DC | PRN

## 2021-09-22 MED ORDER — SUCCINYLCHOLINE CHLORIDE 200 MG/10ML IV SOSY
PREFILLED_SYRINGE | INTRAVENOUS | Status: AC
  Filled 2021-09-22: qty 10

## 2021-09-22 MED ORDER — VITAL 1.5 CAL PO LIQD
1000.0000 mL | ORAL | Status: DC
  Administered 2021-09-22 – 2021-10-11 (×14): 1000 mL
  Filled 2021-09-22: qty 1000

## 2021-09-22 MED ORDER — SODIUM CHLORIDE 0.9 % IV BOLUS
500.0000 mL | Freq: Once | INTRAVENOUS | Status: AC
Start: 2021-09-22 — End: 2021-09-22
  Administered 2021-09-22: 500 mL via INTRAVENOUS

## 2021-09-22 MED ORDER — ORAL CARE MOUTH RINSE
15.0000 mL | OROMUCOSAL | Status: DC
  Administered 2021-09-22 – 2021-10-26 (×331): 15 mL via OROMUCOSAL

## 2021-09-22 MED ORDER — ORAL CARE MOUTH RINSE: 15.0000 mL | Freq: Two times a day (BID) | OROMUCOSAL | Status: DC

## 2021-09-22 MED ORDER — ETOMIDATE 2 MG/ML IV SOLN
INTRAVENOUS | Status: AC
  Administered 2021-09-22: 20 mg
  Filled 2021-09-22: qty 20

## 2021-09-22 MED ORDER — FENTANYL 2500MCG IN NS 250ML (10MCG/ML) PREMIX INFUSION
25.0000 ug/h | INTRAVENOUS | Status: DC
  Administered 2021-09-22: 25 ug/h via INTRAVENOUS
  Administered 2021-09-22 – 2021-09-28 (×13): 200 ug/h via INTRAVENOUS
  Administered 2021-09-29: 150 ug/h via INTRAVENOUS
  Administered 2021-09-29 – 2021-10-02 (×8): 200 ug/h via INTRAVENOUS
  Administered 2021-10-03: 75 ug/h via INTRAVENOUS
  Filled 2021-09-22 (×23): qty 250

## 2021-09-22 MED ORDER — DEXTROSE 50 % IV SOLN
12.5000 g | INTRAVENOUS | Status: AC
  Administered 2021-09-22: 12.5 g via INTRAVENOUS
  Filled 2021-09-22: qty 50

## 2021-09-22 MED ORDER — FENTANYL CITRATE (PF) 100 MCG/2ML IJ SOLN
INTRAMUSCULAR | Status: AC
  Administered 2021-09-22: 100 ug via INTRAVENOUS
  Filled 2021-09-22: qty 2

## 2021-09-22 MED ORDER — FENTANYL CITRATE (PF) 100 MCG/2ML IJ SOLN: 25.0000 ug | Freq: Once | INTRAMUSCULAR | Status: AC

## 2021-09-22 MED ORDER — CHLORHEXIDINE GLUCONATE 0.12 % MT SOLN
15.0000 mL | Freq: Two times a day (BID) | OROMUCOSAL | Status: DC
  Administered 2021-09-22: 15 mL via OROMUCOSAL

## 2021-09-22 MED ORDER — SODIUM CHLORIDE 0.9 % IV SOLN: 2.0000 g | INTRAVENOUS | Status: DC

## 2021-09-22 MED ORDER — ROCURONIUM BROMIDE 10 MG/ML (PF) SYRINGE
PREFILLED_SYRINGE | INTRAVENOUS | Status: AC
  Administered 2021-09-22: 50 mg
  Filled 2021-09-22: qty 10

## 2021-09-22 MED ORDER — DEXTROSE 10 % IV SOLN: INTRAVENOUS | Status: DC

## 2021-09-22 MED ORDER — ENOXAPARIN SODIUM 40 MG/0.4ML IJ SOSY: 40.0000 mg | PREFILLED_SYRINGE | INTRAMUSCULAR | Status: DC

## 2021-09-22 MED ORDER — METOPROLOL TARTRATE 5 MG/5ML IV SOLN
5.0000 mg | INTRAVENOUS | Status: DC | PRN
  Administered 2021-09-22: 5 mg via INTRAVENOUS
  Filled 2021-09-22: qty 5

## 2021-09-22 MED ORDER — PROSOURCE TF PO LIQD
45.0000 mL | Freq: Every day | ORAL | Status: DC
  Administered 2021-09-22 – 2021-10-05 (×12): 45 mL
  Filled 2021-09-22 (×12): qty 45

## 2021-09-22 MED ORDER — DIGOXIN 0.25 MG/ML IJ SOLN
0.5000 mg | Freq: Once | INTRAMUSCULAR | Status: AC
  Administered 2021-09-22: 0.5 mg via INTRAVENOUS
  Filled 2021-09-22: qty 2

## 2021-09-22 MED ORDER — ASPIRIN 81 MG PO CHEW: 81.0000 mg | CHEWABLE_TABLET | Freq: Every day | ORAL | Status: DC

## 2021-09-22 MED ORDER — ADULT MULTIVITAMIN W/MINERALS CH
1.0000 | ORAL_TABLET | Freq: Every day | ORAL | Status: DC
Start: 2021-09-22 — End: 2021-10-30
  Administered 2021-09-22 – 2021-10-29 (×35): 1
  Filled 2021-09-22 (×36): qty 1

## 2021-09-22 MED ORDER — PANTOPRAZOLE SODIUM 40 MG IV SOLR
40.0000 mg | INTRAVENOUS | Status: DC
Start: 2021-09-22 — End: 2021-09-23
  Administered 2021-09-22 – 2021-09-23 (×2): 40 mg via INTRAVENOUS
  Filled 2021-09-22 (×2): qty 10

## 2021-09-22 NOTE — Procedures (Signed)
Name: [name] MRN: [id number] DOB: [birth year]   PROCEDURE NOTE  Procedure:  Endotracheal intubation.  Indication:  Acute respiratory failure  Consent:  Consent was implied due to the emergency nature of the procedure.  Anesthesia:  A total of 10 mg of Etomidate was given intravenously.  Procedure summary:  Appropriate equipment was assembled. The patient was identified as Troy Sampson and safety timeout was performed. The patient was placed supine, with head in sniffing position. After adequate level of anesthesia was achieved, a 4 MAC blade was inserted into the oropharynx and the vocal cords were visualized. A 8.0 endotracheal tube was inserted without difficulty and visualized going through the vocal cords. The stylette was removed and cuff inflated. Colorimetric change was noted on the CO2 meter. Breath sounds were heard over both lung fields equally. ETT was secured at 24 cm @ lip line.  Post procedure chest xray was ordered.  Complications:  No immediate complications were noted.  Hemodynamic parameters and oxygenation remained stable throughout the procedure.      09/22/2021, 12:40 AM

## 2021-09-22 NOTE — Progress Notes (Addendum)
NAME:  Troy Sampson, MRN:  [id number], DOB:  02-May-1956, LOS: 0 ADMISSION DATE:  09/21/2021, CONSULTATION DATE:  09/22/2021 REFERRING MD: Dr. Fredderick Phenix CHIEF COMPLAINT:  SOB   History of Present Illness:  [name] is a 66 y/o male with a PMHx of duodenal ulcer, IDA, nephrolithiasis who presented to the ED with SOB. On arrival, he was obtained 2/2 severe hypoxia and subsequently intubated emergently.   Per family and chart review, patient is a smoker but no know history of COPD. No past medical history of alcohol abuse.   Pertinent  Medical History  Duodenal ulcer with IDA  Significant Hospital Events: Including procedures, antibiotic start and stop dates in addition to other pertinent events   2/22: Admitted overnight to ICU  Interim History / Subjective:   Admitted overnight after being intubated in the ED. FiO2 weaned from 100% to 60%. Tachycardia noted but no hypotension.   This AM, patient is sedated maximally, does not wake voice or touch.   Objective   Blood pressure 101/65, pulse (!) 115, temperature (!) 100.8 F (38.2 C), resp. rate (!) 26, height 5\' 7"  (1.702 m), weight 46.3 kg, SpO2 96 %.    Vent Mode: PRVC FiO2 (%):  [50 %-100 %] 50 % Set Rate:  [15 bmp-20 bmp] 20 bmp Vt Set:  [520 mL] 520 mL PEEP:  [5 cmH20] 5 cmH20 Plateau Pressure:  [19 cmH20-21 cmH20] 21 cmH20   Intake/Output Summary (Last 24 hours) at 09/22/2021 0847 Last data filed at 09/22/2021 0800 Gross per 24 hour  Intake 2782.31 ml  Output 420 ml  Net 2362.31 ml   Filed Weights   09/21/21 1849 09/22/21 0236  Weight: 46.3 kg 46.3 kg   Examination: General: Acute ill-appearing gentleman, appears stated age.  HENT: Moist mucus membranes  Lungs: Clear to auscultation throughout. No rhonchi or wheezing Cardiovascular: Regular rhythm with tachycardia. No murmurs  Abdomen: Soft, non-tender and non-distended. Normoactive bowel sounds Extremities: Warm to touch. No deformities noted Neuro: Sedation  limiting examination  GU: Deferred   Resolved Hospital Problem list     Assessment & Plan:   # Sepsis 2/2 CAP and suspected strep bacteremia  3/4 blood cultures with GPC, most likely strep pneumo with current pneumonia as source.   - Continue Rocephin - TTE already pending   # Acute Hypoxic and Hypercapnic Respiratory Failure 2/2 Community Acquired Pneumonia CXR with lobar pneumonia with positive strep pneumonia antigen. Intubated emergently in the ED due to profound hypoxia and increased work of breathing.   - Continue full vent support - Daily SBT - VAP bundle  - Repeat ABG pending  - Continue Rocephin - Discontinue Azithromycin  # Pancytopenia w/ Severe Thrombocytopenia  # History of IDA - Potentially secondary to severe illness + sepsis  - Hold DVT ppx  - No indication for platelet transfusion at this time   # Demand Ischemia  # Elevated BNP  - No prior known cardiac history nor HTN - EKG with findings consistent with LVH but no acute ischemic changes  - TTE pending   # Lactic Acidosis - Full resuscitation with LR - Repeat lactic acid pending   Best Practice (right click and "Reselect all SmartList Selections" daily)   Diet/type: NPO DVT prophylaxis: SCD GI prophylaxis: PPI Lines: N/A Foley:  N/A Code Status:  full code Last date of multidisciplinary goals of care discussion [Pending]  Labs   CBC: Recent Labs  Lab 09/21/21 1902 09/21/21 1943 09/21/21 2207 09/21/21 2302 09/22/21 0217  09/22/21 0312  WBC 2.5*  --   --   --   --  1.8*  NEUTROABS 2.4  --   --   --   --   --   HGB 13.9 13.6  --  12.6* 13.3 12.9*  HCT 38.1* 40.0  --  37.0* 39.0 36.2*  MCV 95.7  --   --   --   --  95.8  PLT 76*  --  76*  --   --  36*    Basic Metabolic Panel: Recent Labs  Lab 09/21/21 1902 09/21/21 1943 09/21/21 2302 09/22/21 0217 09/22/21 0312  NA 129* 132* 132* 136 135  K 3.3* 3.3* 3.2* 3.3* 3.4*  CL 94*  --   --   --  99  CO2 21*  --   --   --  21*   GLUCOSE 121*  --   --   --  64*  BUN 28*  --   --   --  31*  CREATININE 0.93  --   --   --  1.03  CALCIUM 9.1  --   --   --  8.9  MG  --   --   --   --  2.1   GFR: Estimated Creatinine Clearance: 46.8 mL/min (by C-G formula based on SCr of 1.03 mg/dL). Recent Labs  Lab 09/21/21 1902 09/21/21 1918 09/22/21 0312 09/22/21 0612  WBC 2.5*  --  1.8*  --   LATICACIDVEN 4.3* 2.9* 3.7* 3.6*    Liver Function Tests: Recent Labs  Lab 09/21/21 1902  AST 178*  ALT 78*  ALKPHOS 50  BILITOT 1.9*  PROT 7.4  ALBUMIN 2.7*   No results for input(s): LIPASE, AMYLASE in the last 168 hours. No results for input(s): AMMONIA in the last 168 hours.  ABG    Component Value Date/Time   PHART 7.293 (L) 09/22/2021 0217   PCO2ART 50.2 (H) 09/22/2021 0217   PO2ART 133 (H) 09/22/2021 0217   HCO3 24.2 09/22/2021 0217   TCO2 26 09/22/2021 0217   ACIDBASEDEF 3.0 (H) 09/22/2021 0217   O2SAT 99 09/22/2021 0217     Coagulation Profile: Recent Labs  Lab 09/21/21 1902 09/21/21 2207  INR 1.0 1.1    Cardiac Enzymes: No results for input(s): CKTOTAL, CKMB, CKMBINDEX, TROPONINI in the last 168 hours.  HbA1C: No results found for: HGBA1C  CBG: Recent Labs  Lab 09/22/21 0014 09/22/21 0329 09/22/21 0534 09/22/21 0731  GLUCAP 79 70 110* 134*   Review of Systems:   Negative except as noted above.   Past Medical History:  He,  has no past medical history on file.   Surgical History:  History reviewed. No pertinent surgical history.   Social History:   reports that he has been smoking cigarettes. He does not have any smokeless tobacco history on file. He reports current alcohol use. He reports that he does not use drugs.   Family History:  His family history is not on file.   Allergies Not on File   Home Medications  Prior to Admission medications   Not on File   Dr. Verdene Lennert Internal Medicine PGY-3  09/22/2021, 9:36 AM

## 2021-09-22 NOTE — Progress Notes (Addendum)
Initial Nutrition Assessment  DOCUMENTATION CODES:   Severe malnutrition in context of chronic illness  INTERVENTION:   Initiate tube feeding via OG tube: Vital 1.5 at 20 ml/h and increase by 10 ml every 12 hours to goal rate of 50 ml/hr (1200 ml per day) - messaged RN about TF progression.  Prosource TF 45 ml daily  Provides 1840 kcal, 92 gm protein, 912 ml free water daily  MVI with minerals daily per tube  Monitor magnesium and phosphorus every 12 hours x 4 occurrences, MD to replete as needed, as pt is at risk for refeeding syndrome given severe malnutrition.  Recommend thiamine per tube daily - ok per MD  Recommend checking ferritin, Vitamin B12, and folate - ok per MD  NUTRITION DIAGNOSIS:   Severe Malnutrition related to chronic illness as evidenced by severe muscle depletion, severe fat depletion.  GOAL:   Patient will meet greater than or equal to 90% of their needs  MONITOR:   TF tolerance, Vent status  REASON FOR ASSESSMENT:   Consult, Ventilator Enteral/tube feeding initiation and management  ASSESSMENT:   Pt with active tobacco dependence and PMH of duodenal ulcer, IDA, and nephrolithiasis admitted with severe acute hypoxic and hypercapnic respiratory failure due to CAP.   Pt unable to respond, no family at bedside.  Pt meets criteria for severe malnutrition. Tox screen negative.  Per Pharmacy now found to have strep pneumoniae bacteremia and on recommended antibiotics.   Patient is currently intubated on ventilator support MV: 15.6 L/min Temp (24hrs), Avg:99.7 F (37.6 C), Min:98.1 F (36.7 C), Max:101.8 F (38.8 C)  Propofol: 4 ml/hr Medications reviewed and include: protonix  Rocephin D10 @ 40 ml/hr Fentanyl  LR @ 125 ml/hr   Labs reviewed: K: 3.4 RBC: 3.78, hgb: 10.5 CBG's: 70-134   NUTRITION - FOCUSED PHYSICAL EXAM:  Flowsheet Row Most Recent Value  Orbital Region Severe depletion  Upper Arm Region Severe depletion  Thoracic and  Lumbar Region Severe depletion  Buccal Region Severe depletion  Temple Region Severe depletion  Clavicle Bone Region Severe depletion  Clavicle and Acromion Bone Region Severe depletion  Scapular Bone Region Severe depletion  Dorsal Hand Unable to assess  Patellar Region Severe depletion  Anterior Thigh Region Severe depletion  Posterior Calf Region Severe depletion  Edema (RD Assessment) None  Hair Reviewed  Eyes Reviewed  Mouth Unable to assess  Skin Reviewed  Nails Unable to assess       Diet Order:   Diet Order             Diet NPO time specified  Diet effective now                   EDUCATION NEEDS:   Not appropriate for education at this time  Skin:  Skin Assessment: Reviewed RN Assessment  Last BM:  unknown  Height:   Ht Readings from Last 1 Encounters:  09/22/21 5\' 7"  (1.702 m)    Weight:   Wt Readings from Last 1 Encounters:  09/22/21 46.3 kg    BMI:  Body mass index is 15.99 kg/m.  Estimated Nutritional Needs:   Kcal:  1700-1900  Protein:  80-95 grams  Fluid:  >1.7 L/day  09/24/21., RD, LDN, CNSC See AMiON for contact information

## 2021-09-22 NOTE — H&P (Signed)
NAME:  Troy Sampson, MRN:  [id number], DOB:  February 27, 1956, LOS: 0 ADMISSION DATE:  09/21/2021, CONSULTATION DATE: September 22, 2021 REFERRING MD: [facility], CHIEF COMPLAINT: Shortness of breath History of Present Illness:  Patient is transferred here from [facility] he showed up there with 2 days history of shortness of breath history was obtained by his wife as he arrived here obtunded with severe hypoxia and tachypnea intubated immediately for acute hypoxemic respiratory failure and work of breathing Patient is a smoker he does not use any oxygen and inhalers no medications no past medical history no alcohol abuse [shortness of breath yesterday could not sleep because of shortness of breath cough and showed up today with severe respiratory failure fever temperature of 102 and transferred here was attended had to be intubated  Objective   Blood pressure (!) 167/109, pulse (!) 149, temperature 99.4 F (37.4 C), temperature source Axillary, resp. rate (!) 40, height 5\' 7"  (1.702 m), weight 46.3 kg, SpO2 93 %.    Vent Mode: BIPAP FiO2 (%):  [60 %] 60 % Set Rate:  [15 bmp] 15 bmp PEEP:  [5 cmH20] 5 cmH20   Intake/Output Summary (Last 24 hours) at 09/22/2021 0047 Last data filed at 09/21/2021 2316 Gross per 24 hour  Intake 1348.73 ml  Output --  Net 1348.73 ml   Filed Weights   09/21/21 1849  Weight: 46.3 kg    Examination: General: Severe respiratory distress obtunded Neuro: Moving upper lower extremities without limitations HEENT:  atraumatic , no jaundice , dry mucous membranes  Cardiovascular: Severe tachycardia Lungs: Shallow breathing with severe tachypnea Abdomen:  Soft lax +BS , no tenderness . Musculoskeletal:  WNL , normal pulses  Skin:  No rash     Assessment & Plan:   -- Severe acute hypoxemic respiratory failure with lobar pneumonia community-acquired pneumonia sepsis  with severe respiratory distress requiring intubation Start broad-spectrum  antibiotic for community-acquired pneumonia wait for culture Gram stain and culture for sputum is ordered Legionella Streptococcus pneumonia antigen are ordered COVID and influenza PCR negative. --Severe tachycardia seem to be sinus associated with fever respiratory distress will order troponin echo try to slow the heart rate down check U tox. --History of anemia with requirement of transfusion hemoglobin stable now. --Continue with gentle hydration patient will have Lovenox for DVT prophylaxis    Labs   CBC: Recent Labs  Lab 09/21/21 1902 09/21/21 1943 09/21/21 2207 09/21/21 2302  WBC 2.5*  --   --   --   NEUTROABS 2.4  --   --   --   HGB 13.9 13.6  --  12.6*  HCT 38.1* 40.0  --  37.0*  MCV 95.7  --   --   --   PLT 76*  --  76*  --     Basic Metabolic Panel: Recent Labs  Lab 09/21/21 1902 09/21/21 1943 09/21/21 2302  NA 129* 132* 132*  K 3.3* 3.3* 3.2*  CL 94*  --   --   CO2 21*  --   --   GLUCOSE 121*  --   --   BUN 28*  --   --   CREATININE 0.93  --   --   CALCIUM 9.1  --   --    GFR: Estimated Creatinine Clearance: 51.9 mL/min (by C-G formula based on SCr of 0.93 mg/dL). Recent Labs  Lab 09/21/21 1902 09/21/21 1918  WBC 2.5*  --   LATICACIDVEN 4.3* 2.9*  Liver Function Tests: Recent Labs  Lab 09/21/21 1902  AST 178*  ALT 78*  ALKPHOS 50  BILITOT 1.9*  PROT 7.4  ALBUMIN 2.7*   No results for input(s): LIPASE, AMYLASE in the last 168 hours. No results for input(s): AMMONIA in the last 168 hours.  ABG    Component Value Date/Time   PHART 7.443 09/21/2021 2302   PCO2ART 36.6 09/21/2021 2302   PO2ART 83 09/21/2021 2302   HCO3 25.0 09/21/2021 2302   TCO2 26 09/21/2021 2302   ACIDBASEDEF 1.0 09/21/2021 1943   O2SAT 97 09/21/2021 2302     Coagulation Profile: Recent Labs  Lab 09/21/21 1902 09/21/21 2207  INR 1.0 1.1    Cardiac Enzymes: No results for input(s): CKTOTAL, CKMB, CKMBINDEX, TROPONINI in the last 168 hours.  HbA1C: No  results found for: HGBA1C  CBG: Recent Labs  Lab 09/22/21 0014  GLUCAP 79    Review of Systems:   Unable to obtain due to patient condition  Past Medical History:  He,  has no past medical history on file.   Surgical History:  History reviewed. No pertinent surgical history.   Social History:   reports that he has been smoking cigarettes. He does not have any smokeless tobacco history on file. He reports current alcohol use. He reports that he does not use drugs.   Family History:  His family history is not on file.   Allergies Not on File   Home Medications  Prior to Admission medications   Not on File     Critical care time: Critical care time spent the care of this patient is 48 minutes.

## 2021-09-22 NOTE — Progress Notes (Addendum)
eLink Physician-Brief Progress Note Patient Name: Troy Sampson DOB: 08-14-1955 MRN: [id number]   Date of Service  09/22/2021  HPI/Events of Note  Multiple issues: 1. Hypoglycemia -  Blood glucose = 70. 2. Lactic Acid = 3.7 and 3. Troponin = 136. 12 Lead EKG reveals sinus tachycardia with Premature supraventricular complexes. Right atrial enlargement. Minimal voltage criteria for LVH, may be normal variant ( Sokolow-Lyon). Demand Ischemia? Already on Metoprolol IV Q 6 hours.  eICU Interventions  Plan: D10W IV infusion at 40 mL/hour. Bolus with 0.9 NaCl 500 mL IV over 1 hour now.  Continue to trend Lactic Acid. Add ASA 81 mg per tube now and Q day.  Continue to trend Troponin.      Intervention Category Major Interventions: Other:;Acid-Base disturbance - evaluation and management  Troy Sampson 09/22/2021, 4:36 AM

## 2021-09-22 NOTE — Progress Notes (Addendum)
PHARMACY - PHYSICIAN COMMUNICATION CRITICAL VALUE ALERT - BLOOD CULTURE IDENTIFICATION (BCID)  Troy Sampson is an 66 y.o. male who presented to [facility] on 09/21/2021  Assessment:  34 yom presenting with sepsis due to CAP, now found to have strep pneumoniae bacteremia (3/3 bottles). No CNS symptoms noted.  Name of physician (or Provider) ContactedMerrily Pew, S  Current antibiotics: ceftriaxone 2g IV q24h  Changes to prescribed antibiotics recommended:  Patient is on recommended antibiotics - No changes needed  Results for orders placed or performed during the hospital encounter of 09/21/21  Blood Culture ID Panel (Reflexed) (Collected: 09/21/2021  7:02 PM)  Result Value Ref Range   Enterococcus faecalis NOT DETECTED NOT DETECTED   Enterococcus Faecium NOT DETECTED NOT DETECTED   Listeria monocytogenes NOT DETECTED NOT DETECTED   Staphylococcus species NOT DETECTED NOT DETECTED   Staphylococcus aureus (BCID) NOT DETECTED NOT DETECTED   Staphylococcus epidermidis NOT DETECTED NOT DETECTED   Staphylococcus lugdunensis NOT DETECTED NOT DETECTED   Streptococcus species DETECTED (A) NOT DETECTED   Streptococcus agalactiae NOT DETECTED NOT DETECTED   Streptococcus pneumoniae DETECTED (A) NOT DETECTED   Streptococcus pyogenes NOT DETECTED NOT DETECTED   A.calcoaceticus-baumannii NOT DETECTED NOT DETECTED   Bacteroides fragilis NOT DETECTED NOT DETECTED   Enterobacterales NOT DETECTED NOT DETECTED   Enterobacter cloacae complex NOT DETECTED NOT DETECTED   Escherichia coli NOT DETECTED NOT DETECTED   Klebsiella aerogenes NOT DETECTED NOT DETECTED   Klebsiella oxytoca NOT DETECTED NOT DETECTED   Klebsiella pneumoniae NOT DETECTED NOT DETECTED   Proteus species NOT DETECTED NOT DETECTED   Salmonella species NOT DETECTED NOT DETECTED   Serratia marcescens NOT DETECTED NOT DETECTED   Haemophilus influenzae NOT DETECTED NOT DETECTED   Neisseria meningitidis NOT DETECTED NOT DETECTED    Pseudomonas aeruginosa NOT DETECTED NOT DETECTED   Stenotrophomonas maltophilia NOT DETECTED NOT DETECTED   Candida albicans NOT DETECTED NOT DETECTED   Candida auris NOT DETECTED NOT DETECTED   Candida glabrata NOT DETECTED NOT DETECTED   Candida krusei NOT DETECTED NOT DETECTED   Candida parapsilosis NOT DETECTED NOT DETECTED   Candida tropicalis NOT DETECTED NOT DETECTED   Cryptococcus neoformans/gattii NOT DETECTED NOT DETECTED    Leia Alf, PharmD, BCPS Please check AMION for all [facility] contact numbers Clinical Pharmacist 09/22/2021 11:22 AM

## 2021-09-22 NOTE — Progress Notes (Signed)
[facility] ADULT ICU REPLACEMENT PROTOCOL   The patient does apply for the [facility] Adult ICU Electrolyte Replacment Protocol based on the criteria listed below:   1.Exclusion criteria: TCTS patients, ECMO patients, and Dialysis patients 2. Is GFR >/= 30 ml/min? Yes.    Patient's GFR today is >60 3. Is SCr </= 2? Yes.   Patient's SCr is 1.03 mg/dL 4. Did SCr increase >/= 0.5 in 24 hours? No. 5.Pt's weight >40kg  Yes.   6. Abnormal electrolyte(s): K 3.4 7. Electrolytes replaced per protocol 8.  Call MD STAT for K+ </= 2.5, Phos </= 1, or Mag </= 1 Physician:    Darrick Grinder 09/22/2021 4:38 AM

## 2021-09-22 NOTE — Plan of Care (Signed)

## 2021-09-22 NOTE — Procedures (Signed)
Bedside chest ultrasound:  Bedside chest ultrasound showed dense left lower lobe consolidation, minimal left-sided pleural effusion with atelectasis

## 2021-09-22 NOTE — Progress Notes (Signed)
eLink Physician-Brief Progress Note Patient Name: Thadd Mauk DOB: [birth year] MRN: [id number]   Date of Service  09/22/2021  HPI/Events of Note  Patient intubated and ventilated. Nursing request for sedation.   eICU Interventions  Plan: Propofol IV infusion. Titrate to RASS = 0 to -1.     Intervention Category Major Interventions: Delirium, psychosis, severe agitation - evaluation and management  Jametta Moorehead Eugene 09/22/2021, 1:29 AM

## 2021-09-23 ENCOUNTER — Inpatient Hospital Stay (HOSPITAL_COMMUNITY): Payer: Medicare Other

## 2021-09-23 LAB — CBC
HCT: 30.6 % — ABNORMAL LOW (ref 39.0–52.0)
HCT: 31.4 % — ABNORMAL LOW (ref 39.0–52.0)
Hemoglobin: 10.7 g/dL — ABNORMAL LOW (ref 13.0–17.0)
Hemoglobin: 11.2 g/dL — ABNORMAL LOW (ref 13.0–17.0)
MCH: 34.2 pg — ABNORMAL HIGH (ref 26.0–34.0)
MCH: 35 pg — ABNORMAL HIGH (ref 26.0–34.0)
MCHC: 35 g/dL (ref 30.0–36.0)
MCHC: 35.7 g/dL (ref 30.0–36.0)
MCV: 97.8 fL (ref 80.0–100.0)
MCV: 98.1 fL (ref 80.0–100.0)
Platelets: 9 10*3/uL — CL (ref 150–400)
Platelets: 13 10*3/uL — CL (ref 150–400)
RBC: 3.13 MIL/uL — ABNORMAL LOW (ref 4.22–5.81)
RBC: 3.2 MIL/uL — ABNORMAL LOW (ref 4.22–5.81)
RDW: 14 % (ref 11.5–15.5)
RDW: 14.4 % (ref 11.5–15.5)
WBC: 3.5 10*3/uL — ABNORMAL LOW (ref 4.0–10.5)
WBC: 9 10*3/uL (ref 4.0–10.5)
nRBC: 0 % (ref 0.0–0.2)
nRBC: 0 % (ref 0.0–0.2)

## 2021-09-23 LAB — COMPREHENSIVE METABOLIC PANEL
ALT: 40 U/L (ref 0–44)
AST: 160 U/L — ABNORMAL HIGH (ref 15–41)
Albumin: <1.5 g/dL — ABNORMAL LOW (ref 3.5–5.0)
Alkaline Phosphatase: 41 U/L (ref 38–126)
Anion gap: 8 (ref 5–15)
BUN: 20 mg/dL (ref 8–23)
CO2: 25 mmol/L (ref 22–32)
Calcium: 8.3 mg/dL — ABNORMAL LOW (ref 8.9–10.3)
Chloride: 101 mmol/L (ref 98–111)
Creatinine, Ser: 0.79 mg/dL (ref 0.61–1.24)
GFR, Estimated: >60 mL/min (ref >60)
Glucose, Bld: 130 mg/dL — ABNORMAL HIGH (ref 70–99)
Potassium: 3.2 mmol/L — ABNORMAL LOW (ref 3.5–5.1)
Sodium: 134 mmol/L — ABNORMAL LOW (ref 135–145)
Total Bilirubin: 0.9 mg/dL (ref 0.3–1.2)
Total Protein: 4.7 g/dL — ABNORMAL LOW (ref 6.5–8.1)

## 2021-09-23 LAB — DIC (DISSEMINATED INTRAVASCULAR COAGULATION)PANEL
D-Dimer, Quant: 4.72 ug{FEU}/mL — ABNORMAL HIGH (ref 0.00–0.50)
Fibrinogen: >800 mg/dL — ABNORMAL HIGH (ref 210–475)
INR: 1.1 (ref 0.8–1.2)
Platelets: 7 10*3/uL — CL (ref 150–400)
Prothrombin Time: 14.2 s (ref 11.4–15.2)
Smear Review: NONE SEEN
aPTT: 26 s (ref 24–36)

## 2021-09-23 LAB — ABO/RH
ABO/RH(D): B POS
ABO/RH(D): B POS

## 2021-09-23 LAB — TRIGLYCERIDES: Triglycerides: 140 mg/dL (ref <150)

## 2021-09-23 LAB — TYPE AND SCREEN
ABO/RH(D): B POS
Antibody Screen: NEGATIVE

## 2021-09-23 LAB — GLUCOSE, CAPILLARY
Glucose-Capillary: 120 mg/dL — ABNORMAL HIGH (ref 70–99)
Glucose-Capillary: 137 mg/dL — ABNORMAL HIGH (ref 70–99)
Glucose-Capillary: 163 mg/dL — ABNORMAL HIGH (ref 70–99)
Glucose-Capillary: 72 mg/dL (ref 70–99)
Glucose-Capillary: 84 mg/dL (ref 70–99)
Glucose-Capillary: 87 mg/dL (ref 70–99)
Glucose-Capillary: 99 mg/dL (ref 70–99)

## 2021-09-23 LAB — VITAMIN B12: Vitamin B-12: 707 pg/mL (ref 180–914)

## 2021-09-23 LAB — PHOSPHORUS
Phosphorus: 2.2 mg/dL — ABNORMAL LOW (ref 2.5–4.6)
Phosphorus: 2.4 mg/dL — ABNORMAL LOW (ref 2.5–4.6)

## 2021-09-23 LAB — LEGIONELLA PNEUMOPHILA SEROGP 1 UR AG: L. pneumophila Serogp 1 Ur Ag: NEGATIVE

## 2021-09-23 LAB — CK: Total CK: 508 U/L — ABNORMAL HIGH (ref 49–397)

## 2021-09-23 LAB — MAGNESIUM
Magnesium: 2.2 mg/dL (ref 1.7–2.4)
Magnesium: 2.1 mg/dL (ref 1.7–2.4)

## 2021-09-23 LAB — FERRITIN: Ferritin: 674 ng/mL — ABNORMAL HIGH (ref 24–336)

## 2021-09-23 LAB — LACTIC ACID, PLASMA: Lactic Acid, Venous: 3.1 mmol/L (ref 0.5–1.9)

## 2021-09-23 LAB — PATHOLOGIST SMEAR REVIEW

## 2021-09-23 LAB — FOLATE: Folate: 5.4 ng/mL — ABNORMAL LOW (ref >5.9)

## 2021-09-23 MED ORDER — MAGNESIUM SULFATE 2 GM/50ML IV SOLN
2.0000 g | Freq: Once | INTRAVENOUS | Status: AC
  Administered 2021-09-23: 2 g via INTRAVENOUS
  Filled 2021-09-23: qty 50

## 2021-09-23 MED ORDER — POTASSIUM CHLORIDE 20 MEQ PO PACK
40.0000 meq | PACK | Freq: Once | ORAL | Status: AC
  Administered 2021-09-23: 40 meq
  Filled 2021-09-23: qty 2

## 2021-09-23 MED ORDER — POTASSIUM PHOSPHATES 15 MMOLE/5ML IV SOLN
30.0000 mmol | Freq: Once | INTRAVENOUS | Status: AC
  Administered 2021-09-23: 30 mmol via INTRAVENOUS
  Filled 2021-09-23: qty 10

## 2021-09-23 MED ORDER — MIDAZOLAM HCL 2 MG/2ML IJ SOLN
2.0000 mg | INTRAMUSCULAR | Status: DC | PRN
  Administered 2021-09-23 – 2021-09-25 (×3): 2 mg via INTRAVENOUS
  Filled 2021-09-23 (×3): qty 2

## 2021-09-23 MED ORDER — PANTOPRAZOLE 2 MG/ML SUSPENSION
40.0000 mg | Freq: Every day | ORAL | Status: DC
  Administered 2021-09-24 – 2021-09-29 (×6): 40 mg
  Filled 2021-09-23 (×6): qty 20

## 2021-09-23 MED ORDER — DEXTROSE 50 % IV SOLN
1.0000 | INTRAVENOUS | Status: AC
  Administered 2021-09-23: 50 mL via INTRAVENOUS
  Filled 2021-09-23: qty 50

## 2021-09-23 MED ORDER — SODIUM CHLORIDE 0.9% IV SOLUTION: Freq: Once | INTRAVENOUS | Status: AC

## 2021-09-23 MED ORDER — FOLIC ACID 1 MG PO TABS
1.0000 mg | ORAL_TABLET | Freq: Every day | ORAL | Status: DC
  Administered 2021-09-23 – 2021-10-29 (×34): 1 mg
  Filled 2021-09-23 (×35): qty 1

## 2021-09-23 MED ORDER — VANCOMYCIN HCL 1250 MG/250ML IV SOLN
1250.0000 mg | Freq: Once | INTRAVENOUS | Status: AC
  Administered 2021-09-23: 1250 mg via INTRAVENOUS
  Filled 2021-09-23: qty 250

## 2021-09-23 MED ORDER — SODIUM CHLORIDE 0.9 % IV SOLN
2.0000 g | Freq: Two times a day (BID) | INTRAVENOUS | Status: DC
  Administered 2021-09-23 – 2021-09-26 (×7): 2 g via INTRAVENOUS
  Filled 2021-09-23 (×7): qty 20

## 2021-09-23 MED ORDER — VANCOMYCIN HCL IN DEXTROSE 1-5 GM/200ML-% IV SOLN: 1000.0000 mg | INTRAVENOUS | Status: DC

## 2021-09-23 MED ORDER — VANCOMYCIN HCL 500 MG/100ML IV SOLN
500.0000 mg | Freq: Two times a day (BID) | INTRAVENOUS | Status: DC
  Administered 2021-09-23 – 2021-09-24 (×2): 500 mg via INTRAVENOUS
  Filled 2021-09-23 (×2): qty 100

## 2021-09-23 NOTE — TOC Initial Note (Signed)
Transition of Care [facility]) - Initial/Assessment Note    Patient Details  Name: Troy Sampson MRN: [id number] Date of Birth: 1955-12-26  Transition of Care [facility]) CM/SW Contact:    Milinda Antis, [place] Phone Number: 09/23/2021, 12:21 PM  Clinical Narrative:                 CSW met with the patient and spouse, Ivin Booty, at bedside.  The patient is from home with the spouse and was not using any medical equipment and was independent at home prior to admission.  The patient recently retired and the patient's spouse has family in the area who can assist if needed at discharge.  The patient has received 2 COVID vaccines and one booster.    Transition of Care Department [facility]) has reviewed patient and no TOC needs have been identified at this time. We will continue to monitor patient advancement through interdisciplinary progression rounds. If new patient transition needs arise, please place a TOC consult.    Expected Discharge Plan: [place]     Patient Goals and CMS Choice        Expected Discharge Plan and Services Expected Discharge Plan: [place]                                              Prior Living Arrangements/Services                       Activities of Daily Living      Permission Sought/Granted                  Emotional Assessment              Admission diagnosis:  Acute respiratory failure with hypoxia ([place]) [J96.01] Community acquired pneumonia of left lower lobe of lung [J18.9] Sepsis, due to unspecified organism, unspecified whether acute organ dysfunction present ([place]) [A41.9] Acute respiratory failure ([place]) [J96.00] Patient Active Problem List   Diagnosis Date Noted   Acute respiratory failure ([place]) 09/22/2021   Protein-calorie malnutrition, severe 09/22/2021   Acute respiratory failure with hypoxia ([place])    Sepsis ([place])    Community acquired pneumonia of left lower lobe of lung 09/21/2021   PCP:   Pcp, No Pharmacy:   [id number] [place], [place] - [id number]: [phone]: [phone]    Social Determinants of Health (SDOH) Interventions    Readmission Risk Interventions No flowsheet data found.

## 2021-09-23 NOTE — Progress Notes (Signed)
Date and time results received: 09/23/21   Test: Platelet Critical Value: 9  Name of Provider Notified: Dr. Jacky Kindle  Orders Received? MD placed platelet transfusion order

## 2021-09-23 NOTE — Progress Notes (Addendum)
Pharmacy Antibiotic Note  Troy Sampson is a 66 y.o. male admitted on 09/21/2021 with  CAP, strep pneumo bacteremia, now ruling out meningitis .  Pharmacy has been consulted for vancomycin dosing. Planning LP once platelet count appropriate per CCM. SCr down to 0.79.  Plan: Increase ceftriaxone to 2g IV q12h for CNS coverage Vancomycin 1250mg  IV x 1; then 500mg  IV q12h. Goal trough 15-20 Monitor clinical progress, c/s, renal function, vancomycin trough at steady state F/u LP results as able, de-escalation plan/LOT   Height: 5\' 7"  (170.2 cm) Weight: 50.1 kg (110 lb 7.2 oz) IBW/kg (Calculated) : 66.1  Temp (24hrs), Avg:100.6 F (38.1 C), Min:99.5 F (37.5 C), Max:101.8 F (38.8 C)  Recent Labs  Lab 09/21/21 1902 09/21/21 1918 09/22/21 0312 09/22/21 0612 09/22/21 0926 09/23/21 0640  WBC 2.5*  --  1.8*  --   --  3.5*  CREATININE 0.93  --  1.03  --   --  0.79  LATICACIDVEN 4.3* 2.9* 3.7* 3.6* 4.2*  --     Estimated Creatinine Clearance: 65.2 mL/min (by C-G formula based on SCr of 0.79 mg/dL).    No Known Allergies  09/24/21, PharmD, BCPS Please check AMION for all [facility] contact numbers Clinical Pharmacist 09/23/2021 8:39 AM

## 2021-09-23 NOTE — Plan of Care (Signed)

## 2021-09-23 NOTE — Progress Notes (Addendum)
NAME:  Troy Sampson, MRN:  [id number], DOB:  [birth year], LOS: 1 ADMISSION DATE:  09/21/2021, CONSULTATION DATE:  09/22/2021 REFERRING MD: Dr. Tamera Punt CHIEF COMPLAINT:  SOB   History of Present Illness:  [name] is a 66 y/o male with a PMHx of duodenal ulcer, IDA, nephrolithiasis who presented to the ED with SOB. On arrival, he was obtained 2/2 severe hypoxia and subsequently intubated emergently.   Per family and chart review, patient is a smoker but no know history of COPD. No past medical history of alcohol abuse.   Pertinent  Medical History  Duodenal ulcer with IDA  Significant Hospital Events: Including procedures, antibiotic start and stop dates in addition to other pertinent events   2/22: Admitted overnight to ICU. Blood cultures 3/4 positive for strep pneumo. Strep pneumo antigen positive   Interim History / Subjective:   Overnight, required several boluses of Fentanyl with agitation. Febrile throughout the night.   AST noted to be elevated. Wife states he drinks 2 drinks twice per week however patient's daughter disclosed that patient is "drinking a lot more than that." She is unsure if he drinks daily though.   Multiple electrolyte abnormalities including hypophosphatemia, hypokalemia.  Significant decline in platelets down to 9. No bleeding overnight noted.   Objective   Blood pressure 122/75, pulse (!) 113, temperature (!) 100.8 F (38.2 C), temperature source Bladder, resp. rate (!) 32, height 5\' 7"  (1.702 m), weight 50.1 kg, SpO2 94 %.    Vent Mode: PRVC FiO2 (%):  [40 %-50 %] 40 % Set Rate:  [20 bmp] 20 bmp Vt Set:  [520 mL] 520 mL PEEP:  [5 cmH20] 5 cmH20 Pressure Support:  [10 cmH20] 10 cmH20 Plateau Pressure:  [17 cmH20-20 cmH20] 18 cmH20   Intake/Output Summary (Last 24 hours) at 09/23/2021 0845 Last data filed at 09/23/2021 0800 Gross per 24 hour  Intake 7724.5 ml  Output 3555 ml  Net 4169.5 ml    Filed Weights   09/21/21 1849 09/22/21 0236  09/23/21 0500  Weight: 46.3 kg 46.3 kg 50.1 kg   Examination: General: Acute ill-appearing gentleman, appears stated age.  HENT: Moist mucus membranes  Lungs: Clear to auscultation throughout. No rhonchi or wheezing Cardiovascular: Regular rhythm with tachycardia. No murmurs  Abdomen: Soft, non-tender and non-distended. Normoactive bowel sounds Extremities: Warm to touch. No deformities noted Neuro: Sedation limiting examination  GU: Deferred   Resolved Hospital Problem list     Assessment & Plan:   # Sepsis 2/2 Strep Pneumo CAP + Bacteremia  - Given agitation and lack of responsiveness with sedation weaning, will consider obtaining LP to rule out meninginitis given high risk - Continue Rocephin, increase dose to 2g q12h - Not currently requiring pressor support   # Acute Hypoxic and Hypercapnic Respiratory Failure 2/2 Community Acquired Pneumonia - Continue full vent support. Wean FiO2 and PEEP as tolerated - Daily SBT - VAP bundle  - Continue Rocephin  # Pancytopenia w/ Severe Thrombocytopenia  # History of IDA - Potentially secondary to severe illness + sepsis, but high risk for DIC - DIC panel pending - Will plan to transfer platelets if DIC panel is negative. Consent obtained from patient's wife  - Hold DVT ppx and Aspirin   # Demand Ischemia  # Elevated BNP  - No prior known cardiac history nor HTN - EKG with findings consistent with LVH but no acute ischemic changes  - TTE with preserved EF  # Lactic Acidosis - Repeat lactic acid pending   #  Elevated LFTs - Will monitor closely and discuss history of alcohol use with family. Per chart review, no prior history of alcohol abuse  - Repeat CK pending  - RUQ ultrasound ordered   # Electrolyte Abnormalities: Hypophosphatemia, Hypokalemia - Monitor daily and replete PRN  Best Practice (right click and "Reselect all SmartList Selections" daily)   Diet/type: NPO DVT prophylaxis: SCD GI prophylaxis: PPI Lines:  N/A Foley:  N/A Code Status:  full code Last date of multidisciplinary goals of care discussion [Wife and daughter updated at bedside on 2/22]  Labs   CBC: Recent Labs  Lab 09/21/21 1902 09/21/21 1943 09/21/21 2207 09/21/21 2302 09/22/21 0217 09/22/21 0312 09/22/21 1250 09/23/21 0640  WBC 2.5*  --   --   --   --  1.8*  --  3.5*  NEUTROABS 2.4  --   --   --   --   --   --   --   HGB 13.9   < >  --  12.6* 13.3 12.9* 10.5* 10.7*  HCT 38.1*   < >  --  37.0* 39.0 36.2* 31.0* 30.6*  MCV 95.7  --   --   --   --  95.8  --  97.8  PLT 76*  --  76*  --   --  36*  --  9*   < > = values in this interval not displayed.     Basic Metabolic Panel: Recent Labs  Lab 09/21/21 1902 09/21/21 1943 09/21/21 2302 09/22/21 0217 09/22/21 0312 09/22/21 1250 09/22/21 1959 09/23/21 0640  NA 129*   < > 132* 136 135 137  --  134*  K 3.3*   < > 3.2* 3.3* 3.4* 3.7  --  3.2*  CL 94*  --   --   --  99  --   --  101  CO2 21*  --   --   --  21*  --   --  25  GLUCOSE 121*  --   --   --  64*  --   --  130*  BUN 28*  --   --   --  31*  --   --  20  CREATININE 0.93  --   --   --  1.03  --   --  0.79  CALCIUM 9.1  --   --   --  8.9  --   --  8.3*  MG  --   --   --   --  2.1  --  1.7 2.2  PHOS  --   --   --   --   --   --  1.4* 2.2*   < > = values in this interval not displayed.    GFR: Estimated Creatinine Clearance: 65.2 mL/min (by C-G formula based on SCr of 0.79 mg/dL). Recent Labs  Lab 09/21/21 1902 09/21/21 1918 09/22/21 0312 09/22/21 0612 09/22/21 0926 09/23/21 0640  WBC 2.5*  --  1.8*  --   --  3.5*  LATICACIDVEN 4.3* 2.9* 3.7* 3.6* 4.2*  --      Liver Function Tests: Recent Labs  Lab 09/21/21 1902 09/22/21 1959 09/23/21 0640  AST 178* 154* 160*  ALT 78* 43 40  ALKPHOS 50 35* 41  BILITOT 1.9* 1.0 0.9  PROT 7.4 5.0* 4.7*  ALBUMIN 2.7* <1.5* <1.5*    No results for input(s): LIPASE, AMYLASE in the last 168 hours. No results for input(s): AMMONIA in the last 168  hours.  ABG    Component Value Date/Time   PHART 7.320 (L) 09/22/2021 1250   PCO2ART 45.4 09/22/2021 1250   PO2ART 74 (L) 09/22/2021 1250   HCO3 23.4 09/22/2021 1250   TCO2 25 09/22/2021 1250   ACIDBASEDEF 3.0 (H) 09/22/2021 1250   O2SAT 93 09/22/2021 1250      Coagulation Profile: Recent Labs  Lab 09/21/21 1902 09/21/21 2207  INR 1.0 1.1     Cardiac Enzymes: Recent Labs  Lab 09/22/21 1959  CKTOTAL 588*    HbA1C: No results found for: HGBA1C  CBG: Recent Labs  Lab 09/22/21 1511 09/22/21 1949 09/22/21 2327 09/23/21 0338 09/23/21 0725  GLUCAP 94 121* 116* 120* 137*    Review of Systems:   Negative except as noted above.   Past Medical History:  He,  has no past medical history on file.   Surgical History:  History reviewed. No pertinent surgical history.   Social History:   reports that he has been smoking cigarettes. He does not have any smokeless tobacco history on file. He reports current alcohol use. He reports that he does not use drugs.   Family History:  His family history is not on file.   Allergies No Known Allergies   Home Medications  Prior to Admission medications   Not on File   Dr. Jose Persia Internal Medicine PGY-3  09/23/2021, 8:45 AM

## 2021-09-23 NOTE — Progress Notes (Signed)
eLink Physician-Brief Progress Note Patient Name: Troy Sampson DOB: 1955/11/27 MRN: [id number]   Date of Service  09/23/2021  HPI/Events of Note  Hypophosphatemia   Hypomagnesemia - PO4--- 1.4, Mg++ = 1.7, K+ = 3.7 and Creatinine = 1.03.   eICU Interventions  Plan: Replace K+, PO4--- and Mg++. Repeat phosphorus level at 12 noon.     Intervention Category Major Interventions: Electrolyte abnormality - evaluation and management  Jamison Yuhasz Dennard Nip 09/23/2021, 12:57 AM

## 2021-09-24 ENCOUNTER — Inpatient Hospital Stay (HOSPITAL_COMMUNITY): Payer: Medicare Other

## 2021-09-24 DIAGNOSIS — R6521 Severe sepsis with septic shock: Secondary | ICD-10-CM

## 2021-09-24 LAB — GLUCOSE, CAPILLARY
Glucose-Capillary: 99 mg/dL (ref 70–99)
Glucose-Capillary: 115 mg/dL — ABNORMAL HIGH (ref 70–99)
Glucose-Capillary: 125 mg/dL — ABNORMAL HIGH (ref 70–99)
Glucose-Capillary: 122 mg/dL — ABNORMAL HIGH (ref 70–99)
Glucose-Capillary: 108 mg/dL — ABNORMAL HIGH (ref 70–99)
Glucose-Capillary: 105 mg/dL — ABNORMAL HIGH (ref 70–99)

## 2021-09-24 LAB — COMPREHENSIVE METABOLIC PANEL
ALT: 47 U/L — ABNORMAL HIGH (ref 0–44)
AST: 198 U/L — ABNORMAL HIGH (ref 15–41)
Albumin: 1.5 g/dL — ABNORMAL LOW (ref 3.5–5.0)
Alkaline Phosphatase: 87 U/L (ref 38–126)
Anion gap: 8 (ref 5–15)
BUN: 21 mg/dL (ref 8–23)
CO2: 26 mmol/L (ref 22–32)
Calcium: 8.3 mg/dL — ABNORMAL LOW (ref 8.9–10.3)
Chloride: 105 mmol/L (ref 98–111)
Creatinine, Ser: 0.65 mg/dL (ref 0.61–1.24)
GFR, Estimated: >60 mL/min (ref >60)
Glucose, Bld: 101 mg/dL — ABNORMAL HIGH (ref 70–99)
Potassium: 3.3 mmol/L — ABNORMAL LOW (ref 3.5–5.1)
Sodium: 139 mmol/L (ref 135–145)
Total Bilirubin: 1.3 mg/dL — ABNORMAL HIGH (ref 0.3–1.2)
Total Protein: 5 g/dL — ABNORMAL LOW (ref 6.5–8.1)

## 2021-09-24 LAB — CBC WITH DIFFERENTIAL/PLATELET
Abs Immature Granulocytes: 0 K/uL (ref 0.00–0.07)
Basophils Absolute: 0 K/uL (ref 0.0–0.1)
Basophils Relative: 0 %
Eosinophils Absolute: 0 K/uL (ref 0.0–0.5)
Eosinophils Relative: 0 %
HCT: 33.1 % — ABNORMAL LOW (ref 39.0–52.0)
Hemoglobin: 11.5 g/dL — ABNORMAL LOW (ref 13.0–17.0)
Lymphocytes Relative: 2 %
Lymphs Abs: 0.2 K/uL — ABNORMAL LOW (ref 0.7–4.0)
MCH: 34.4 pg — ABNORMAL HIGH (ref 26.0–34.0)
MCHC: 34.7 g/dL (ref 30.0–36.0)
MCV: 99.1 fL (ref 80.0–100.0)
Monocytes Absolute: 0.1 K/uL (ref 0.1–1.0)
Monocytes Relative: 1 %
Neutro Abs: 8.5 K/uL — ABNORMAL HIGH (ref 1.7–7.7)
Neutrophils Relative %: 97 %
Platelets: 10 K/uL — CL (ref 150–400)
RBC: 3.34 MIL/uL — ABNORMAL LOW (ref 4.22–5.81)
RDW: 14.5 % (ref 11.5–15.5)
Smear Review: DECREASED
WBC Morphology: INCREASED
WBC: 8.8 K/uL (ref 4.0–10.5)
nRBC: 0 % (ref 0.0–0.2)

## 2021-09-24 LAB — APTT: aPTT: 27 seconds (ref 24–36)

## 2021-09-24 LAB — BPAM PLATELET PHERESIS
Blood Product Expiration Date: 202302232359
ISSUE DATE / TIME: 202302231002
Unit Type and Rh: 6200

## 2021-09-24 LAB — CBC
HCT: 29.7 % — ABNORMAL LOW (ref 39.0–52.0)
Hemoglobin: 10.2 g/dL — ABNORMAL LOW (ref 13.0–17.0)
MCH: 34.2 pg — ABNORMAL HIGH (ref 26.0–34.0)
MCHC: 34.3 g/dL (ref 30.0–36.0)
MCV: 99.7 fL (ref 80.0–100.0)
Platelets: 56 K/uL — ABNORMAL LOW (ref 150–400)
RBC: 2.98 MIL/uL — ABNORMAL LOW (ref 4.22–5.81)
RDW: 14.6 % (ref 11.5–15.5)
WBC: 10.4 K/uL (ref 4.0–10.5)
nRBC: 0 % (ref 0.0–0.2)

## 2021-09-24 LAB — PREPARE PLATELET PHERESIS: Unit division: 0

## 2021-09-24 LAB — CULTURE, BLOOD (ROUTINE X 2): Special Requests: ADEQUATE

## 2021-09-24 LAB — LACTIC ACID, PLASMA: Lactic Acid, Venous: 2.2 mmol/L (ref 0.5–1.9)

## 2021-09-24 LAB — FIBRINOGEN: Fibrinogen: 779 mg/dL — ABNORMAL HIGH (ref 210–475)

## 2021-09-24 LAB — PROTIME-INR
INR: 1.1 (ref 0.8–1.2)
Prothrombin Time: 13.9 s (ref 11.4–15.2)

## 2021-09-24 LAB — MAGNESIUM: Magnesium: 2.1 mg/dL (ref 1.7–2.4)

## 2021-09-24 MED ORDER — POTASSIUM CHLORIDE 20 MEQ PO PACK
20.0000 meq | PACK | ORAL | Status: AC
  Administered 2021-09-24 (×2): 20 meq
  Filled 2021-09-24 (×2): qty 1

## 2021-09-24 MED ORDER — NICOTINE 14 MG/24HR TD PT24
14.0000 mg | MEDICATED_PATCH | Freq: Every day | TRANSDERMAL | Status: DC
  Administered 2021-09-25 – 2021-10-05 (×11): 14 mg via TRANSDERMAL
  Filled 2021-09-24 (×11): qty 1

## 2021-09-24 MED ORDER — SODIUM CHLORIDE 0.9% IV SOLUTION: Freq: Once | INTRAVENOUS | Status: AC

## 2021-09-24 MED ORDER — IOHEXOL 9 MG/ML PO SOLN
500.0000 mL | ORAL | Status: AC
  Administered 2021-09-24 (×2): 500 mL via ORAL

## 2021-09-24 MED ORDER — DOCUSATE SODIUM 50 MG/5ML PO LIQD
100.0000 mg | Freq: Two times a day (BID) | ORAL | Status: DC | PRN
  Administered 2021-10-20: 100 mg
  Filled 2021-09-24 (×2): qty 10

## 2021-09-24 MED ORDER — POLYETHYLENE GLYCOL 3350 17 G PO PACK
17.0000 g | PACK | Freq: Every day | ORAL | Status: DC | PRN
  Filled 2021-09-24: qty 1

## 2021-09-24 MED ORDER — POLYETHYLENE GLYCOL 3350 17 G PO PACK: 17.0000 g | PACK | Freq: Every day | ORAL | Status: DC

## 2021-09-24 MED ORDER — POTASSIUM CHLORIDE 10 MEQ/100ML IV SOLN
10.0000 meq | INTRAVENOUS | Status: AC
  Administered 2021-09-24 (×4): 10 meq via INTRAVENOUS
  Filled 2021-09-24 (×4): qty 100

## 2021-09-24 MED ORDER — DOCUSATE SODIUM 50 MG/5ML PO LIQD: 100.0000 mg | Freq: Two times a day (BID) | ORAL | Status: DC

## 2021-09-24 MED ORDER — IOHEXOL 350 MG/ML SOLN
85.0000 mL | Freq: Once | INTRAVENOUS | Status: AC | PRN
  Administered 2021-09-24: 85 mL via INTRAVENOUS

## 2021-09-24 NOTE — Progress Notes (Addendum)
eLink Physician-Brief Progress Note Patient Name: Troy Sampson DOB: Mar 24, 1956 MRN: [id number]   Date of Service  09/24/2021  HPI/Events of Note  Platelet at 10 K notified.was at 13 K. Hg stable at 11.5 WBC 8.8  INR 1.1, Fibrinogen > 800 at 8 AM/23 rd.  On Ventilator.   eICU Interventions  Transfuse platelet. Get DIC panel.  Discussed with RN, did receive one unit platelet yesterday.   DIC panel.       Intervention Category Intermediate Interventions: Other: (Thrombocytopenia)  Elmer Sow 09/24/2021, 12:19 AM  5:07 CBC platelet > 50 K, Hg stable. INR normal. Follow fibrinogen that ordered with PRBC protocol, if < 250 to call us back.

## 2021-09-24 NOTE — Progress Notes (Signed)
[facility] ADULT ICU REPLACEMENT PROTOCOL   The patient does apply for the [facility] Adult ICU Electrolyte Replacment Protocol based on the criteria listed below:   1.Exclusion criteria: TCTS patients, ECMO patients, and Dialysis patients 2. Is GFR >/= 30 ml/min? Yes.    Patient's GFR today is >60 3. Is SCr </= 2? Yes.   Patient's SCr is 0.65 mg/dL 4. Did SCr increase >/= 0.5 in 24 hours? No. 5.Pt's weight >40kg  Yes.   6. Abnormal electrolyte(s):   K 3.3  7. Electrolytes replaced per protocol 8.  Call MD STAT for K+ </= 2.5, Phos </= 1, or Mag </= 1 Physician:  Jani Files R Mazzie Brodrick 09/24/2021 5:57 AM

## 2021-09-24 NOTE — Progress Notes (Signed)
Rn and Rt transported to CT scan and back without complications.

## 2021-09-24 NOTE — Progress Notes (Signed)
NAME:  Troy Sampson, MRN:  [id number], DOB:  [birth year], LOS: 2 ADMISSION DATE:  09/21/2021, CONSULTATION DATE:  09/22/2021 REFERRING MD: Dr. Fredderick Phenix CHIEF COMPLAINT:  SOB   History of Present Illness:  [name] is a 66 y/o male with a PMHx of duodenal ulcer, IDA, nephrolithiasis who presented to the ED with SOB. On arrival, he was obtained 2/2 severe hypoxia and subsequently intubated emergently.   Per family and chart review, patient is a smoker but no know history of COPD. No past medical history of alcohol abuse.   Pertinent  Medical History  Duodenal ulcer with IDA  Significant Hospital Events: Including procedures, antibiotic start and stop dates in addition to other pertinent events   2/22: Admitted overnight to ICU. Blood cultures 3/4 positive for strep pneumo. Strep pneumo antigen positive   Interim History / Subjective:   Overnight, platelets decreased from from 13 to 10. Due tot this, repeat DIC panel ordered and negative. Additional 2 units of pheresed platelets ordered. Repeat platelet count improved to 56.  Afebrile overnight. Persistent mild tachycardia.  This AM, patient is sedated. Grimaces to pain but no other responses.   Objective   Blood pressure 113/71, pulse (!) 105, temperature 97.9 F (36.6 C), temperature source Oral, resp. rate (!) 26, height 5\' 7"  (1.702 m), weight 52.2 kg, SpO2 98 %.    Vent Mode: PRVC FiO2 (%):  [40 %-50 %] 40 % Set Rate:  [20 bmp] 20 bmp Vt Set:  [520 mL] 520 mL PEEP:  [5 cmH20] 5 cmH20 Pressure Support:  [5 cmH20] 5 cmH20 Plateau Pressure:  [18 cmH20-23 cmH20] 23 cmH20   Intake/Output Summary (Last 24 hours) at 09/24/2021 09/26/2021 Last data filed at 09/24/2021 0600 Gross per 24 hour  Intake 3126.68 ml  Output 1455 ml  Net 1671.68 ml    Filed Weights   09/22/21 0236 09/23/21 0500 09/24/21 0500  Weight: 46.3 kg 50.1 kg 52.2 kg   Examination: General: Acute ill-appearing gentleman, appears stated age.  HENT: Moist mucus  membranes.  Lungs: Left lower lung fields with fine crackles and rhonchi. No wheezing. Rest of examination is clear.  Cardiovascular: Regular rhythm with tachycardia. No murmurs  Abdomen: Soft, non-distended. Normoactive bowel sounds. Grimaces with palpation.  Extremities: Warm to touch. No deformities noted Neuro: Sedation limiting examination  GU: Deferred   Resolved Hospital Problem list     Assessment & Plan:   # Sepsis 2/2 Strep Pneumo CAP + Bacteremia  - Hold off on LP at this time - TTE without evidence of endocarditis.  - Continue Rocephin 2g q12h for empiric meningeal coverage - Not currently requiring pressor support   # Acute Hypoxic and Hypercapnic Respiratory Failure 2/2 Community Acquired Pneumonia - Continue full vent support - Hold off on SBT today - VAP bundle  - Continue Rocephin - CT chest w/o contrast   # Pancytopenia w/ Severe Thrombocytopenia  # History of IDA - Potentially secondary to severe illness + sepsis. DIC panel negative x 3.  - S/p 3 units of pheresed platelets. Platelets this AM improved to 56.  - CBC BID  - Hold DVT ppx and Aspirin   # Demand Ischemia  # Elevated BNP  - No prior known cardiac history nor HTN - EKG with findings consistent with LVH but no acute ischemic changes  - TTE with preserved EF  # Lactic Acidosis - Improving  # Elevated LFTs - Per patient's daughter, patient drinks more than wife is aware of but  does not know how much.  - RUQ obtained due to elevated AST. Results demonstrate several large solid lesions in the liver, concerning for potential metastatic disease. Largest mass is 9.6 x 5.5 cm, however unable to delineate if arises from the liver or from the adjacent adrenal glad  - Results with evidence of gallbladder sludge and potential inflammation with biliary duct dilatation. Acute cholecystitis is not suspected clinically at this time - Will obtain CT A/P w/ contrast  # Electrolyte Abnormalities:  Hypophosphatemia, Hypokalemia - Monitor daily and replete PRN  Best Practice (right click and "Reselect all SmartList Selections" daily)   Diet/type: NPO.  Tube Feeds DVT prophylaxis: SCD GI prophylaxis: PPI Lines: N/A Foley:  N/A Code Status:  full code Last date of multidisciplinary goals of care discussion [Wife and daughter updated at bedside on 2/24]  Labs   CBC: Recent Labs  Lab 09/21/21 1902 09/21/21 1943 09/22/21 0312 09/22/21 1250 09/23/21 0640 09/23/21 0843 09/23/21 1556 09/23/21 2249 09/24/21 0254  WBC 2.5*  --  1.8*  --  3.5*  --  9.0 8.8 10.4  NEUTROABS 2.4  --   --   --   --   --   --  8.5*  --   HGB 13.9   < > 12.9* 10.5* 10.7*  --  11.2* 11.5* 10.2*  HCT 38.1*   < > 36.2* 31.0* 30.6*  --  31.4* 33.1* 29.7*  MCV 95.7  --  95.8  --  97.8  --  98.1 99.1 99.7  PLT 76*   < > 36*  --  9* 7* 13* 10* 56*   < > = values in this interval not displayed.     Basic Metabolic Panel: Recent Labs  Lab 09/21/21 1902 09/21/21 1943 09/22/21 0217 09/22/21 0312 09/22/21 1250 09/22/21 1959 09/23/21 0640 09/23/21 1556 09/24/21 0254  NA 129*   < > 136 135 137  --  134*  --  139  K 3.3*   < > 3.3* 3.4* 3.7  --  3.2*  --  3.3*  CL 94*  --   --  99  --   --  101  --  105  CO2 21*  --   --  21*  --   --  25  --  26  GLUCOSE 121*  --   --  64*  --   --  130*  --  101*  BUN 28*  --   --  31*  --   --  20  --  21  CREATININE 0.93  --   --  1.03  --   --  0.79  --  0.65  CALCIUM 9.1  --   --  8.9  --   --  8.3*  --  8.3*  MG  --   --   --  2.1  --  1.7 2.2 2.1 2.1  PHOS  --   --   --   --   --  1.4* 2.2* 2.4*  --    < > = values in this interval not displayed.    GFR: Estimated Creatinine Clearance: 68 mL/min (by C-G formula based on SCr of 0.65 mg/dL). Recent Labs  Lab 09/22/21 0612 09/22/21 0926 09/23/21 0640 09/23/21 0843 09/23/21 1556 09/23/21 2249 09/24/21 0254 09/24/21 0700  WBC  --   --  3.5*  --  9.0 8.8 10.4  --   LATICACIDVEN 3.6* 4.2*  --  3.1*  --    --   --  2.2*     Liver Function Tests: Recent Labs  Lab 09/21/21 1902 09/22/21 1959 09/23/21 0640 09/24/21 0254  AST 178* 154* 160* 198*  ALT 78* 43 40 47*  ALKPHOS 50 35* 41 87  BILITOT 1.9* 1.0 0.9 1.3*  PROT 7.4 5.0* 4.7* 5.0*  ALBUMIN 2.7* <1.5* <1.5* 1.5*    No results for input(s): LIPASE, AMYLASE in the last 168 hours. No results for input(s): AMMONIA in the last 168 hours.  ABG    Component Value Date/Time   PHART 7.320 (L) 09/22/2021 1250   PCO2ART 45.4 09/22/2021 1250   PO2ART 74 (L) 09/22/2021 1250   HCO3 23.4 09/22/2021 1250   TCO2 25 09/22/2021 1250   ACIDBASEDEF 3.0 (H) 09/22/2021 1250   O2SAT 93 09/22/2021 1250      Coagulation Profile: Recent Labs  Lab 09/21/21 1902 09/21/21 2207 09/23/21 0843 09/24/21 0254  INR 1.0 1.1 1.1 1.1     Cardiac Enzymes: Recent Labs  Lab 09/22/21 1959 09/23/21 0843  CKTOTAL 588* 508*     HbA1C: No results found for: HGBA1C  CBG: Recent Labs  Lab 09/23/21 1610 09/23/21 1935 09/23/21 2336 09/24/21 0324 09/24/21 0731  GLUCAP 163* 99 87 99 115*    Review of Systems:   Negative except as noted above.   Past Medical History:  He,  has no past medical history on file.   Surgical History:  History reviewed. No pertinent surgical history.   Social History:   reports that he has been smoking cigarettes. He does not have any smokeless tobacco history on file. He reports current alcohol use. He reports that he does not use drugs.   Family History:  His family history is not on file.   Allergies No Known Allergies   Home Medications  Prior to Admission medications   Not on File   Dr. Verdene Lennert Internal Medicine PGY-3  09/24/2021, 8:28 AM

## 2021-09-25 DIAGNOSIS — J9602 Acute respiratory failure with hypercapnia: Secondary | ICD-10-CM

## 2021-09-25 DIAGNOSIS — J13 Pneumonia due to Streptococcus pneumoniae: Secondary | ICD-10-CM

## 2021-09-25 LAB — CBC
HCT: 28.5 % — ABNORMAL LOW (ref 39.0–52.0)
Hemoglobin: 10.1 g/dL — ABNORMAL LOW (ref 13.0–17.0)
MCH: 34.9 pg — ABNORMAL HIGH (ref 26.0–34.0)
MCHC: 35.4 g/dL (ref 30.0–36.0)
MCV: 98.6 fL (ref 80.0–100.0)
Platelets: 20 10*3/uL — CL (ref 150–400)
RBC: 2.89 MIL/uL — ABNORMAL LOW (ref 4.22–5.81)
RDW: 15 % (ref 11.5–15.5)
WBC: 15.7 10*3/uL — ABNORMAL HIGH (ref 4.0–10.5)
nRBC: 0 % (ref 0.0–0.2)

## 2021-09-25 LAB — PREPARE PLATELET PHERESIS
Unit division: 0
Unit division: 0

## 2021-09-25 LAB — MAGNESIUM: Magnesium: 2.3 mg/dL (ref 1.7–2.4)

## 2021-09-25 LAB — BPAM PLATELET PHERESIS
Blood Product Expiration Date: 202302252359
Blood Product Expiration Date: 202302252359
ISSUE DATE / TIME: 202302240045
ISSUE DATE / TIME: 202302240045
Unit Type and Rh: 5100
Unit Type and Rh: 7300

## 2021-09-25 LAB — COMPREHENSIVE METABOLIC PANEL
ALT: 79 U/L — ABNORMAL HIGH (ref 0–44)
AST: 277 U/L — ABNORMAL HIGH (ref 15–41)
Albumin: <1.5 g/dL — ABNORMAL LOW (ref 3.5–5.0)
Alkaline Phosphatase: 165 U/L — ABNORMAL HIGH (ref 38–126)
Anion gap: 6 (ref 5–15)
BUN: 27 mg/dL — ABNORMAL HIGH (ref 8–23)
CO2: 25 mmol/L (ref 22–32)
Calcium: 8.2 mg/dL — ABNORMAL LOW (ref 8.9–10.3)
Chloride: 107 mmol/L (ref 98–111)
Creatinine, Ser: 0.62 mg/dL (ref 0.61–1.24)
GFR, Estimated: >60 mL/min (ref >60)
Glucose, Bld: 100 mg/dL — ABNORMAL HIGH (ref 70–99)
Potassium: 3.8 mmol/L (ref 3.5–5.1)
Sodium: 138 mmol/L (ref 135–145)
Total Bilirubin: 1.7 mg/dL — ABNORMAL HIGH (ref 0.3–1.2)
Total Protein: 4.6 g/dL — ABNORMAL LOW (ref 6.5–8.1)

## 2021-09-25 LAB — GLUCOSE, CAPILLARY
Glucose-Capillary: 102 mg/dL — ABNORMAL HIGH (ref 70–99)
Glucose-Capillary: 90 mg/dL (ref 70–99)
Glucose-Capillary: 88 mg/dL (ref 70–99)
Glucose-Capillary: 108 mg/dL — ABNORMAL HIGH (ref 70–99)
Glucose-Capillary: 111 mg/dL — ABNORMAL HIGH (ref 70–99)
Glucose-Capillary: 104 mg/dL — ABNORMAL HIGH (ref 70–99)

## 2021-09-25 LAB — PHOSPHORUS: Phosphorus: 2.3 mg/dL — ABNORMAL LOW (ref 2.5–4.6)

## 2021-09-25 LAB — LACTIC ACID, PLASMA: Lactic Acid, Venous: 1.6 mmol/L (ref 0.5–1.9)

## 2021-09-25 LAB — HEPATITIS C ANTIBODY: HCV Ab: REACTIVE — AB

## 2021-09-25 LAB — PSA: Prostatic Specific Antigen: 8.83 ng/mL — ABNORMAL HIGH (ref 0.00–4.00)

## 2021-09-25 LAB — HEPATITIS B CORE ANTIBODY, TOTAL: Hep B Core Total Ab: REACTIVE — AB

## 2021-09-25 MED ORDER — POTASSIUM PHOSPHATES 15 MMOLE/5ML IV SOLN
15.0000 mmol | Freq: Once | INTRAVENOUS | Status: AC
  Administered 2021-09-25: 15 mmol via INTRAVENOUS
  Filled 2021-09-25: qty 5

## 2021-09-25 MED ORDER — IPRATROPIUM-ALBUTEROL 0.5-2.5 (3) MG/3ML IN SOLN: 3.0000 mL | RESPIRATORY_TRACT | Status: DC | PRN

## 2021-09-25 MED ORDER — CLONAZEPAM 1 MG PO TABS
1.0000 mg | ORAL_TABLET | Freq: Two times a day (BID) | ORAL | Status: DC
  Administered 2021-09-25 – 2021-09-30 (×12): 1 mg
  Filled 2021-09-25 (×12): qty 1

## 2021-09-25 NOTE — Progress Notes (Signed)
eLink Physician-Brief Progress Note Patient Name: Troy Sampson DOB: 1956-03-30 MRN: [id number]   Date of Service  09/25/2021  HPI/Events of Note  Notified of thrombocytopenia with platelets at 20K <--56 .  No signs of bleeding.    eICU Interventions  Hold off on platelet transfusion. Continue to monitor.      Intervention Category Intermediate Interventions: Other:  Larinda Buttery 09/25/2021, 3:49 AM

## 2021-09-25 NOTE — Progress Notes (Addendum)
Pharmacy Electrolyte Replacement  Recent Labs:  Recent Labs    09/25/21 0243  K 3.8  MG 2.3  PHOS 2.3*  CREATININE 0.62    Low Critical Values (K </= 2.5, Phos </= 1, Mg </= 1) Present: None  MD Contacted: n/a  Plan: KPhos 15 mmol IV per protocol as concern for refeeding. Repeat Phos in AM.   Link Snuffer, PharmD, BCPS, BCCCP Clinical Pharmacist Please refer to [facility] for [facility] numbers 09/25/2021, 12:05 PM

## 2021-09-25 NOTE — Progress Notes (Signed)
RT NOTE: held SBT on patient this AM due to patient being on 50% and sats were only 93%.  Tolerating current ventilator settings well at this time.  Will continue to monitor.

## 2021-09-25 NOTE — Progress Notes (Addendum)
NAME:  Troy Sampson, MRN:  [id number], DOB:  [birth year], LOS: 3 ADMISSION DATE:  09/21/2021, CONSULTATION DATE:  09/22/2021 REFERRING MD: Dr. Tamera Punt CHIEF COMPLAINT:  SOB   History of Present Illness:  [name] is a 66 y/o male with a PMHx of duodenal ulcer, IDA, nephrolithiasis who presented to the ED with SOB. On arrival, he was obtained 2/2 severe hypoxia and subsequently intubated emergently.   Per family and chart review, patient is a smoker but no know history of COPD. No past medical history of alcohol abuse.   Pertinent  Medical History  Duodenal ulcer with IDA  Significant Hospital Events: Including procedures, antibiotic start and stop dates in addition to other pertinent events   2/22: Admitted overnight to ICU. Blood cultures 3/4 positive for strep pneumo. Strep pneumo antigen positive   Interim History / Subjective:   Overnight, platelets decreased from 56 to 20. No indication for transfusion at this time.  LFTs increasing. WBC increasing. UOP 1.5 L.   Sedated this AM.   Objective   Blood pressure 106/65, pulse 96, temperature 97.7 F (36.5 C), temperature source Oral, resp. rate (!) 25, height 5\' 7"  (1.702 m), weight 52.2 kg, SpO2 99 %.    Vent Mode: PRVC FiO2 (%):  [40 %-50 %] 50 % Set Rate:  [20 bmp] 20 bmp Vt Set:  [520 mL] 520 mL PEEP:  [5 cmH20] 5 cmH20 Plateau Pressure:  [21 cmH20-23 cmH20] 22 cmH20   Intake/Output Summary (Last 24 hours) at 09/25/2021 [id number] Last data filed at 09/25/2021 0100 Gross per 24 hour  Intake 1706.58 ml  Output 1695 ml  Net 11.58 ml    Filed Weights   09/22/21 0236 09/23/21 0500 09/24/21 0500  Weight: 46.3 kg 50.1 kg 52.2 kg   Examination:  General: Acute ill-appearing gentleman, appears stated age. Bitemporal wasting noted.  HENT: Moist mucus membranes.  Lungs: Bilateral lower lung fields with fine crackles and some rhonchi. Mild expiratory wheezing throughout.  Cardiovascular: Regular rhythm and rate. No murmurs   Abdomen: Soft, non-distended. Normoactive bowel sounds.  Extremities: Warm to touch. No deformities noted Neuro: Sedation limiting examination  GU: Deferred   Resolved Hospital Problem list   # Lactic Acidosis   Assessment & Plan:   # Sepsis 2/2 Strep Pneumo CAP + Bacteremia  - CT chest with evidence of significant multi-focal pneumonia - Hold off on LP at this time  - TTE without evidence of endocarditis.  - Continue Rocephin 2g q12h for empiric meningeal coverage - Not currently requiring pressor support  - Repeat blood cultures today to ensure clearance   # Acute Hypoxic and Hypercapnic Respiratory Failure 2/2 Community Acquired Pneumonia # Suspected COPD - Continue full vent support - Daily SBT as tolerated - VAP bundle  - Continue Rocephin - Start Duonebs PRN  # Acute Encephalopathy Initial suspicion that encephalopathy is secondary to sepsis, however daily wake trials have been limited and patient is requiring high doses of sedation. Differential includes alcohol withdrawal versus less-likely meningitis from strep bacteremia.   - Start Klonopin 1 mg BID - Wean Propofol and Fentanyl - Will consider switching from Propofol to Precedex if no improvement   # Pancytopenia w/ Severe Thrombocytopenia  # History of IDA - Potentially secondary to severe illness + sepsis. DIC panel negative x 3.  - S/p 3 units of pheresed platelets - Platelets are 20 this AM.  No indication for repeat transfusion - CBC BID  - Hold DVT ppx and Aspirin   #  Liver Masses  # Elevated LFTs - 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 No current risk factors for HCC. Suspect metastasis with unknown primary. PSA minimally elevated so low suspicion for that being primary. Discussed pursuing IR biopsy in the future with family but given critical illness, patient is not currently stable enough.  - Will check for Hep B, Hep C and AFP  # Urinary Retention Likely medication related. No  hydronephrosis on imaging or AKI.   - Continue bladder scans q8h - In/out cath for volume > 300 cc - Place foley if more than 3 in/out required  # Demand Ischemia  # Elevated BNP  - No prior known cardiac history nor HTN - EKG with findings consistent with LVH but no acute ischemic changes  - TTE with preserved EF  # Electrolyte Abnormalities: Hypophosphatemia, Hypokalemia - Monitor daily and replete PRN  Best Practice (right click and "Reselect all SmartList Selections" daily)   Diet/type: NPO.  Tube Feeds restarted today.  DVT prophylaxis: SCD GI prophylaxis: PPI Lines: N/A Foley:  N/A Code Status:  full code Last date of multidisciplinary goals of care discussion [Wife and daughter updated at bedside on 2/24]  Labs   CBC: Recent Labs  Lab 09/21/21 1902 09/21/21 1943 09/23/21 0640 09/23/21 0843 09/23/21 1556 09/23/21 2249 09/24/21 0254 09/25/21 0243  WBC 2.5*   < > 3.5*  --  9.0 8.8 10.4 15.7*  NEUTROABS 2.4  --   --   --   --  8.5*  --   --   HGB 13.9   < > 10.7*  --  11.2* 11.5* 10.2* 10.1*  HCT 38.1*   < > 30.6*  --  31.4* 33.1* 29.7* 28.5*  MCV 95.7   < > 97.8  --  98.1 99.1 99.7 98.6  PLT 76*   < > 9* 7* 13* 10* 56* 20*   < > = values in this interval not displayed.     Basic Metabolic Panel: Recent Labs  Lab 09/21/21 1902 09/21/21 1943 09/22/21 0312 09/22/21 1250 09/22/21 1959 09/23/21 0640 09/23/21 1556 09/24/21 0254 09/25/21 0243  NA 129*   < > 135 137  --  134*  --  139 138  K 3.3*   < > 3.4* 3.7  --  3.2*  --  3.3* 3.8  CL 94*  --  99  --   --  101  --  105 107  CO2 21*  --  21*  --   --  25  --  26 25  GLUCOSE 121*  --  64*  --   --  130*  --  101* 100*  BUN 28*  --  31*  --   --  20  --  21 27*  CREATININE 0.93  --  1.03  --   --  0.79  --  0.65 0.62  CALCIUM 9.1  --  8.9  --   --  8.3*  --  8.3* 8.2*  MG  --    < > 2.1  --  1.7 2.2 2.1 2.1 2.3  PHOS  --   --   --   --  1.4* 2.2* 2.4*  --  2.3*   < > = values in this interval not  displayed.    GFR: Estimated Creatinine Clearance: 68 mL/min (by C-G formula based on SCr of 0.62 mg/dL). Recent Labs  Lab 09/22/21 0926 09/23/21 0640 09/23/21 0843 09/23/21 1556 09/23/21 2249 09/24/21 0254 09/24/21 0700  09/25/21 0243  WBC  --    < >  --  9.0 8.8 10.4  --  15.7*  LATICACIDVEN 4.2*  --  3.1*  --   --   --  2.2* 1.6   < > = values in this interval not displayed.     Liver Function Tests: Recent Labs  Lab 09/21/21 1902 09/22/21 1959 09/23/21 0640 09/24/21 0254 09/25/21 0243  AST 178* 154* 160* 198* 277*  ALT 78* 43 40 47* 79*  ALKPHOS 50 35* 41 87 165*  BILITOT 1.9* 1.0 0.9 1.3* 1.7*  PROT 7.4 5.0* 4.7* 5.0* 4.6*  ALBUMIN 2.7* <1.5* <1.5* 1.5* <1.5*    No results for input(s): LIPASE, AMYLASE in the last 168 hours. No results for input(s): AMMONIA in the last 168 hours.  ABG    Component Value Date/Time   PHART 7.320 (L) 09/22/2021 1250   PCO2ART 45.4 09/22/2021 1250   PO2ART 74 (L) 09/22/2021 1250   HCO3 23.4 09/22/2021 1250   TCO2 25 09/22/2021 1250   ACIDBASEDEF 3.0 (H) 09/22/2021 1250   O2SAT 93 09/22/2021 1250      Coagulation Profile: Recent Labs  Lab 09/21/21 1902 09/21/21 2207 09/23/21 0843 09/24/21 0254  INR 1.0 1.1 1.1 1.1     Cardiac Enzymes: Recent Labs  Lab 09/22/21 1959 09/23/21 0843  CKTOTAL 588* 508*     HbA1C: No results found for: HGBA1C  CBG: Recent Labs  Lab 09/24/21 1619 09/24/21 1924 09/24/21 2327 09/25/21 0316 09/25/21 0722  GLUCAP 122* 108* 105* 102* 90    Review of Systems:   Negative except as noted above.    Signed,  Dr. Jose Persia Internal Medicine PGY-3  09/25/2021, 8:11 AM

## 2021-09-26 ENCOUNTER — Inpatient Hospital Stay (HOSPITAL_COMMUNITY): Payer: Medicare Other

## 2021-09-26 LAB — CBC WITH DIFFERENTIAL/PLATELET
Abs Immature Granulocytes: 0 10*3/uL (ref 0.00–0.07)
Basophils Absolute: 0 10*3/uL (ref 0.0–0.1)
Basophils Relative: 0 %
Eosinophils Absolute: 0 10*3/uL (ref 0.0–0.5)
Eosinophils Relative: 0 %
HCT: 26.8 % — ABNORMAL LOW (ref 39.0–52.0)
Hemoglobin: 9.4 g/dL — ABNORMAL LOW (ref 13.0–17.0)
Lymphocytes Relative: 2 %
Lymphs Abs: 0.4 10*3/uL — ABNORMAL LOW (ref 0.7–4.0)
MCH: 34.3 pg — ABNORMAL HIGH (ref 26.0–34.0)
MCHC: 35.1 g/dL (ref 30.0–36.0)
MCV: 97.8 fL (ref 80.0–100.0)
Monocytes Absolute: 0.2 10*3/uL (ref 0.1–1.0)
Monocytes Relative: 1 %
Neutro Abs: 18.3 10*3/uL — ABNORMAL HIGH (ref 1.7–7.7)
Neutrophils Relative %: 97 %
Platelets: 22 10*3/uL — CL (ref 150–400)
RBC: 2.74 MIL/uL — ABNORMAL LOW (ref 4.22–5.81)
RDW: 15.1 % (ref 11.5–15.5)
WBC Morphology: INCREASED
WBC: 18.9 10*3/uL — ABNORMAL HIGH (ref 4.0–10.5)
nRBC: 0 % (ref 0.0–0.2)
nRBC: 1 /100 WBC — ABNORMAL HIGH

## 2021-09-26 LAB — PROTIME-INR
INR: 1.1 (ref 0.8–1.2)
Prothrombin Time: 14 seconds (ref 11.4–15.2)

## 2021-09-26 LAB — GLUCOSE, CAPILLARY
Glucose-Capillary: 124 mg/dL — ABNORMAL HIGH (ref 70–99)
Glucose-Capillary: 121 mg/dL — ABNORMAL HIGH (ref 70–99)
Glucose-Capillary: 112 mg/dL — ABNORMAL HIGH (ref 70–99)
Glucose-Capillary: 154 mg/dL — ABNORMAL HIGH (ref 70–99)
Glucose-Capillary: 172 mg/dL — ABNORMAL HIGH (ref 70–99)
Glucose-Capillary: 179 mg/dL — ABNORMAL HIGH (ref 70–99)

## 2021-09-26 LAB — HEPATITIS B SURFACE ANTIGEN: Hepatitis B Surface Ag: NONREACTIVE

## 2021-09-26 LAB — COMPREHENSIVE METABOLIC PANEL
ALT: 89 U/L — ABNORMAL HIGH (ref 0–44)
AST: 268 U/L — ABNORMAL HIGH (ref 15–41)
Albumin: <1.5 g/dL — ABNORMAL LOW (ref 3.5–5.0)
Alkaline Phosphatase: 221 U/L — ABNORMAL HIGH (ref 38–126)
Anion gap: 9 (ref 5–15)
BUN: 36 mg/dL — ABNORMAL HIGH (ref 8–23)
CO2: 24 mmol/L (ref 22–32)
Calcium: 7.9 mg/dL — ABNORMAL LOW (ref 8.9–10.3)
Chloride: 109 mmol/L (ref 98–111)
Creatinine, Ser: 0.76 mg/dL (ref 0.61–1.24)
GFR, Estimated: >60 mL/min (ref >60)
Glucose, Bld: 120 mg/dL — ABNORMAL HIGH (ref 70–99)
Potassium: 4.3 mmol/L (ref 3.5–5.1)
Sodium: 142 mmol/L (ref 135–145)
Total Bilirubin: 2 mg/dL — ABNORMAL HIGH (ref 0.3–1.2)
Total Protein: 4.5 g/dL — ABNORMAL LOW (ref 6.5–8.1)

## 2021-09-26 LAB — HIV ANTIBODY (ROUTINE TESTING W REFLEX): HIV Screen 4th Generation wRfx: NONREACTIVE

## 2021-09-26 LAB — TRIGLYCERIDES: Triglycerides: 209 mg/dL — ABNORMAL HIGH (ref <150)

## 2021-09-26 LAB — PHOSPHORUS: Phosphorus: 3.2 mg/dL (ref 2.5–4.6)

## 2021-09-26 MED ORDER — VANCOMYCIN HCL 1250 MG/250ML IV SOLN
1250.0000 mg | INTRAVENOUS | Status: DC
  Administered 2021-09-26 – 2021-09-28 (×3): 1250 mg via INTRAVENOUS
  Filled 2021-09-26 (×3): qty 250

## 2021-09-26 MED ORDER — QUETIAPINE FUMARATE 50 MG PO TABS
50.0000 mg | ORAL_TABLET | Freq: Two times a day (BID) | ORAL | Status: DC
  Administered 2021-09-26 – 2021-09-29 (×8): 50 mg
  Filled 2021-09-26 (×8): qty 1

## 2021-09-26 MED ORDER — DEXMEDETOMIDINE HCL IN NACL 400 MCG/100ML IV SOLN
0.0000 ug/kg/h | INTRAVENOUS | Status: DC
  Administered 2021-09-26: 0.6 ug/kg/h via INTRAVENOUS
  Administered 2021-09-26: 0.4 ug/kg/h via INTRAVENOUS
  Administered 2021-09-27: 0.6 ug/kg/h via INTRAVENOUS
  Filled 2021-09-26 (×3): qty 100

## 2021-09-26 MED ORDER — SODIUM CHLORIDE 0.9 % IV SOLN
2.0000 g | Freq: Once | INTRAVENOUS | Status: AC
  Administered 2021-09-26: 2 g via INTRAVENOUS
  Filled 2021-09-26: qty 2

## 2021-09-26 MED ORDER — SODIUM CHLORIDE 0.9 % IV SOLN
2.0000 g | Freq: Three times a day (TID) | INTRAVENOUS | Status: DC
  Administered 2021-09-26 – 2021-09-28 (×5): 2 g via INTRAVENOUS
  Filled 2021-09-26 (×6): qty 2

## 2021-09-26 NOTE — Progress Notes (Signed)
NAME:  Troy Sampson, MRN:  [id number], DOB:  Aug 28, 1955, LOS: 4 ADMISSION DATE:  09/21/2021, CONSULTATION DATE:  09/22/2021 REFERRING MD: Dr. Tamera Punt CHIEF COMPLAINT:  SOB   History of Present Illness:  Troy Sampson is a 66 y/o male with a PMHx of duodenal ulcer, IDA, nephrolithiasis who presented to the ED with SOB. On arrival, he was obtained 2/2 severe hypoxia and subsequently intubated emergently.   Per family and chart review, patient is a smoker but no know history of COPD. No past medical history of alcohol abuse.   Pertinent  Medical History  Duodenal ulcer with IDA  Significant Hospital Events: Including procedures, antibiotic start and stop dates in addition to other pertinent events   2/22: Admitted overnight to ICU. Blood cultures 3/4 positive for strep pneumo. Strep pneumo antigen positive   Interim History / Subjective:   Overnight, RN noted patient required several boluses of Fentanyl and Versed; continued to be agitated with vent dyssynchrony.  Febrile overnight up to 101.7.  HCV antibody positive.   Objective   Blood pressure (!) 96/58, pulse 91, temperature 99.2 F (37.3 C), temperature source Oral, resp. rate (!) 28, height 5\' 7"  (1.702 m), weight 55 kg, SpO2 93 %.    Vent Mode: PRVC FiO2 (%):  [50 %] 50 % Set Rate:  [20 bmp] 20 bmp Vt Set:  [520 mL] 520 mL PEEP:  [5 cmH20] 5 cmH20 Plateau Pressure:  [20 cmH20-25 cmH20] 23 cmH20   Intake/Output Summary (Last 24 hours) at 09/26/2021 0732 Last data filed at 09/26/2021 0700 Gross per 24 hour  Intake 2201.92 ml  Output 1300 ml  Net 901.92 ml    Filed Weights   09/24/21 0500 09/25/21 0500 09/26/21 0321  Weight: 52.2 kg 55.6 kg 55 kg   Examination:  General: Acute ill-appearing gentleman, appears stated age. Bitemporal wasting noted.  HENT: Moist mucus membranes.  Lungs: Bilateral lower lung fields with fine crackles. No wheezing today.  Cardiovascular: Regular rhythm and rate. No murmurs  Abdomen: Soft,  non-distended. Normoactive bowel sounds.  Extremities: Warm to touch. No deformities noted Neuro: Sedation limiting examination. Does not wake.  GU: Deferred   Resolved Hospital Problem list   # Lactic Acidosis   Assessment & Plan:   # Sepsis 2/2 Strep Pneumo CAP + Bacteremia  - CT chest with evidence of significant multi-focal pneumonia. CXR today appears worsened - New onset fevers in the last 12 hours concerning for lack of infection control vs new hospital acquired infection  - Hold off on LP at this time  - TTE without evidence of endocarditis. If repeat blood cultures are positive for strep pneumo, may need to discuss TEE. - Broaden antibiotics to Cefepime and Vancomycin  - Not currently requiring pressor support although MAP is borderline.  - Repeat blood cultures pending - Obtain tracheal aspirate cultures today  # Acute Hypoxic and Hypercapnic Respiratory Failure 2/2 Community Acquired Pneumonia # Suspected COPD - CXR today with evidence of worsening multi-focal opacities. FiO2 requirements increased.  - Continue full vent support. Decreased TV to 6 cc/kg given concern for impending ARDS - Daily SBT as tolerated - VAP bundle  - PAD protocol: Change Propofol to Precedex. Continue Fentanyl. - Broaden antibiotics to Cefepime and Vancomycin  - Cpntinue Duonebs PRN  # Acute Encephalopathy Initial suspicion that encephalopathy is secondary to sepsis, however daily wake trials have been limited and patient is requiring high doses of sedation. Differential includes alcohol withdrawal versus less-likely meningitis from strep bacteremia.   -  Continue Klonopin 1 mg BID  - Add Seroquel  - Change Propofol to Precedex. Continue Fentanyl.  # Pancytopenia w/ Severe Thrombocytopenia  # History of IDA - Potentially secondary to severe illness + sepsis. DIC panel negative x 3.  - S/p 3 units of pheresed platelets - Platelets slowly improving  No indication for repeat transfusion - CBC  daily  - Hold DVT ppx and Aspirin   # Liver Masses  # Elevated LFTs 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 PSA minimally elevated so low suspicion for that being primary. Discussed pursuing IR biopsy in the future with family but given critical illness, patient is not currently stable enough. LFTs continue to increase today.   - AFP and Hep C RNA quant pending  # Urinary Retention Likely medication related. No hydronephrosis on imaging or AKI.   - Continue bladder scans q8h - In/out cath for volume > 300 cc - Place foley if more than 3 in/out required  # Demand Ischemia  # Elevated BNP  - No prior known cardiac history nor HTN - EKG with findings consistent with LVH but no acute ischemic changes  - TTE with preserved EF  # Electrolyte Abnormalities: Hypophosphatemia, Hypokalemia - Monitor daily and replete PRN  Best Practice (right click and "Reselect all SmartList Selections" daily)   Diet/type: NPO.  Tube Feeds DVT prophylaxis: SCD GI prophylaxis: PPI Lines: N/A Foley:  N/A Code Status:  full code Last date of multidisciplinary goals of care discussion [Wife and daughter updated at bedside on 2/25]  Labs   CBC: Recent Labs  Lab 09/21/21 1902 09/21/21 1943 09/23/21 1556 09/23/21 2249 09/24/21 0254 09/25/21 0243 09/26/21 0039  WBC 2.5*   < > 9.0 8.8 10.4 15.7* 18.9*  NEUTROABS 2.4  --   --  8.5*  --   --  18.3*  HGB 13.9   < > 11.2* 11.5* 10.2* 10.1* 9.4*  HCT 38.1*   < > 31.4* 33.1* 29.7* 28.5* 26.8*  MCV 95.7   < > 98.1 99.1 99.7 98.6 97.8  PLT 76*   < > 13* 10* 56* 20* 22*   < > = values in this interval not displayed.     Basic Metabolic Panel: Recent Labs  Lab 09/22/21 0312 09/22/21 1250 09/22/21 1959 09/23/21 0640 09/23/21 1556 09/24/21 0254 09/25/21 0243 09/26/21 0039  NA 135 137  --  134*  --  139 138 142   K 3.4* 3.7  --  3.2*  --  3.3* 3.8 4.3  CL 99  --   --  101  --  105 107 109  CO2 21*  --   --  25  --  26 25 24   GLUCOSE 64*  --   --  130*  --  101* 100* 120*  BUN 31*  --   --  20  --  21 27* 36*  CREATININE 1.03  --   --  0.79  --  0.65 0.62 0.76  CALCIUM 8.9  --   --  8.3*  --  8.3* 8.2* 7.9*  MG 2.1  --  1.7 2.2 2.1 2.1 2.3  --   PHOS  --   --  1.4* 2.2* 2.4*  --  2.3* 3.2    GFR: Estimated Creatinine Clearance: 71.6 mL/min (by C-G formula based on SCr of 0.76 mg/dL). Recent Labs  Lab 09/22/21 [phone]  09/23/21 0640 09/23/21 0843 09/23/21 1556 09/23/21 2249 09/24/21 0254 09/24/21 0700 09/25/21 0243 09/26/21 0039  WBC  --    < >  --    < > 8.8 10.4  --  15.7* 18.9*  LATICACIDVEN 4.2*  --  3.1*  --   --   --  2.2* 1.6  --    < > = values in this interval not displayed.     Liver Function Tests: Recent Labs  Lab 09/22/21 1959 09/23/21 0640 09/24/21 0254 09/25/21 0243 09/26/21 0039  AST 154* 160* 198* 277* 268*  ALT 43 40 47* 79* 89*  ALKPHOS 35* 41 87 165* 221*  BILITOT 1.0 0.9 1.3* 1.7* 2.0*  PROT 5.0* 4.7* 5.0* 4.6* 4.5*  ALBUMIN <1.5* <1.5* 1.5* <1.5* <1.5*    No results for input(s): LIPASE, AMYLASE in the last 168 hours. No results for input(s): AMMONIA in the last 168 hours.  ABG    Component Value Date/Time   PHART 7.320 (L) 09/22/2021 1250   PCO2ART 45.4 09/22/2021 1250   PO2ART 74 (L) 09/22/2021 1250   HCO3 23.4 09/22/2021 1250   TCO2 25 09/22/2021 1250   ACIDBASEDEF 3.0 (H) 09/22/2021 1250   O2SAT 93 09/22/2021 1250      Coagulation Profile: Recent Labs  Lab 09/21/21 1902 09/21/21 2207 09/23/21 0843 09/24/21 0254 09/26/21 0039  INR 1.0 1.1 1.1 1.1 1.1     Cardiac Enzymes: Recent Labs  Lab 09/22/21 1959 09/23/21 0843  CKTOTAL 588* 508*     HbA1C: No results found for: HGBA1C  CBG: Recent Labs  Lab 09/25/21 1515 09/25/21 1923 09/25/21 2323 09/26/21 0314 09/26/21 0717  GLUCAP 108* 111* 104* 124* 121*    Review of  Systems:   Negative except as noted above.    Signed,  Dr. Jose Persia Internal Medicine PGY-3  09/26/2021, 7:32 AM

## 2021-09-26 NOTE — Progress Notes (Signed)
RT NOTE: attempted SBT on patient this AM, on CPAP/PSV of 15/5, at 0755 however patient's RR increased to 40s.  Placed patient back on full support ventilator settings.  Will continue to monitor.

## 2021-09-26 NOTE — Progress Notes (Signed)
Sputum sample obtained and sent down to main lab without complications.  

## 2021-09-26 NOTE — Progress Notes (Signed)
Pharmacy Antibiotic Note  Troy Sampson is a 66 y.o. male admitted on 09/21/2021 with streptococcus pneumoniae bacteremia, now with concern for pneumonia.  Pharmacy has been consulted for Vancomycin and Cefepime dosing.  Patient has been on Ceftriaxone (6 days). Worsening leukocytosis and new fevers, max 102.8. Hypotensive. Broadening antibiotics.  SCr is stable at 0.76. Good UOP.  Received Vanc on 2/23 and 2/24 x3 doses total.   Plan: Cefepime 2g IV every 8 hours.  Vancomycin 1250 mg IV every 24 hours. cAUC 494 (SCr used 0.8).   Follow-up clinical status, new cultures Height: 5\' 7"  (170.2 cm) Weight: 55 kg (121 lb 4.1 oz) IBW/kg (Calculated) : 66.1  Temp (24hrs), Avg:99.8 F (37.7 C), Min:97.6 F (36.4 C), Max:102.8 F (39.3 C)  Recent Labs  Lab 09/22/21 0312 09/22/21 0612 09/22/21 0926 09/23/21 0640 09/23/21 0843 09/23/21 1556 09/23/21 2249 09/24/21 0254 09/24/21 0700 09/25/21 0243 09/26/21 0039  WBC 1.8*  --   --  3.5*  --  9.0 8.8 10.4  --  15.7* 18.9*  CREATININE 1.03  --   --  0.79  --   --   --  0.65  --  0.62 0.76  LATICACIDVEN 3.7* 3.6* 4.2*  --  3.1*  --   --   --  2.2* 1.6  --     Estimated Creatinine Clearance: 71.6 mL/min (by C-G formula based on SCr of 0.76 mg/dL).    No Known Allergies  Antimicrobials this admission: 2/21 CTX (dose incr 2/23) >> 2/21 azithro x 1 2/23 vanc >>2/24  Dose adjustments this admission:   Microbiology results: 2/21 BCx - strep pneumo 3/3 (pen-s) 2/21 strep pneumo urinary antigen+ 2/21 legionella - neg 2/22 mrsa pcr - neg 2/25 BCx >> 2/26 RespCx >>  Thank you for allowing pharmacy to be a part of this patients care.  3/26, PharmD, BCPS, BCCCP Clinical Pharmacist Please refer to [facility] for [facility] Pharmacy numbers 09/26/2021 9:33 AM

## 2021-09-27 ENCOUNTER — Inpatient Hospital Stay (HOSPITAL_COMMUNITY): Payer: Medicare Other

## 2021-09-27 LAB — POCT I-STAT 7, (LYTES, BLD GAS, ICA,H+H)
Acid-Base Excess: 0 mmol/L (ref 0.0–2.0)
Acid-base deficit: 2 mmol/L (ref 0.0–2.0)
Acid-base deficit: 1 mmol/L (ref 0.0–2.0)
Acid-base deficit: 1 mmol/L (ref 0.0–2.0)
Acid-base deficit: 2 mmol/L (ref 0.0–2.0)
Bicarbonate: 25.7 mmol/L (ref 20.0–28.0)
Bicarbonate: 28.9 mmol/L — ABNORMAL HIGH (ref 20.0–28.0)
Bicarbonate: 26.4 mmol/L (ref 20.0–28.0)
Bicarbonate: 26.7 mmol/L (ref 20.0–28.0)
Bicarbonate: 26.6 mmol/L (ref 20.0–28.0)
Calcium, Ion: 1.22 mmol/L (ref 1.15–1.40)
Calcium, Ion: 1.25 mmol/L (ref 1.15–1.40)
Calcium, Ion: 1.17 mmol/L (ref 1.15–1.40)
Calcium, Ion: 1.2 mmol/L (ref 1.15–1.40)
Calcium, Ion: 1.17 mmol/L (ref 1.15–1.40)
HCT: 32 % — ABNORMAL LOW (ref 39.0–52.0)
HCT: 27 % — ABNORMAL LOW (ref 39.0–52.0)
HCT: 27 % — ABNORMAL LOW (ref 39.0–52.0)
HCT: 25 % — ABNORMAL LOW (ref 39.0–52.0)
HCT: 25 % — ABNORMAL LOW (ref 39.0–52.0)
Hemoglobin: 10.9 g/dL — ABNORMAL LOW (ref 13.0–17.0)
Hemoglobin: 9.2 g/dL — ABNORMAL LOW (ref 13.0–17.0)
Hemoglobin: 9.2 g/dL — ABNORMAL LOW (ref 13.0–17.0)
Hemoglobin: 8.5 g/dL — ABNORMAL LOW (ref 13.0–17.0)
Hemoglobin: 8.5 g/dL — ABNORMAL LOW (ref 13.0–17.0)
O2 Saturation: 89 %
O2 Saturation: 88 %
O2 Saturation: 94 %
O2 Saturation: 98 %
O2 Saturation: 96 %
Patient temperature: 98.4
Patient temperature: 98.4
Patient temperature: 97.5
Patient temperature: 98.4
Patient temperature: 98.4
Potassium: 4.4 mmol/L (ref 3.5–5.1)
Potassium: 5.1 mmol/L (ref 3.5–5.1)
Potassium: 4.7 mmol/L (ref 3.5–5.1)
Potassium: 4.4 mmol/L (ref 3.5–5.1)
Potassium: 4.6 mmol/L (ref 3.5–5.1)
Sodium: 147 mmol/L — ABNORMAL HIGH (ref 135–145)
Sodium: 149 mmol/L — ABNORMAL HIGH (ref 135–145)
Sodium: 148 mmol/L — ABNORMAL HIGH (ref 135–145)
Sodium: 148 mmol/L — ABNORMAL HIGH (ref 135–145)
Sodium: 148 mmol/L — ABNORMAL HIGH (ref 135–145)
TCO2: 27 mmol/L (ref 22–32)
TCO2: 32 mmol/L (ref 22–32)
TCO2: 28 mmol/L (ref 22–32)
TCO2: 28 mmol/L (ref 22–32)
TCO2: 29 mmol/L (ref 22–32)
pCO2 arterial: 47.3 mmHg (ref 32–48)
pCO2 arterial: 98.8 mmHg (ref 32–48)
pCO2 arterial: 53.7 mmHg — ABNORMAL HIGH (ref 32–48)
pCO2 arterial: 58.9 mmHg — ABNORMAL HIGH (ref 32–48)
pCO2 arterial: 64.6 mmHg — ABNORMAL HIGH (ref 32–48)
pH, Arterial: 7.343 — ABNORMAL LOW (ref 7.35–7.45)
pH, Arterial: 7.073 — CL (ref 7.35–7.45)
pH, Arterial: 7.297 — ABNORMAL LOW (ref 7.35–7.45)
pH, Arterial: 7.263 — ABNORMAL LOW (ref 7.35–7.45)
pH, Arterial: 7.221 — ABNORMAL LOW (ref 7.35–7.45)
pO2, Arterial: 61 mmHg — ABNORMAL LOW (ref 83–108)
pO2, Arterial: 79 mmHg — ABNORMAL LOW (ref 83–108)
pO2, Arterial: 78 mmHg — ABNORMAL LOW (ref 83–108)
pO2, Arterial: 116 mmHg — ABNORMAL HIGH (ref 83–108)
pO2, Arterial: 97 mmHg (ref 83–108)

## 2021-09-27 LAB — HEMOGLOBIN A1C
Hgb A1c MFr Bld: 5 % (ref 4.8–5.6)
Mean Plasma Glucose: 96.8 mg/dL

## 2021-09-27 LAB — COMPREHENSIVE METABOLIC PANEL
ALT: 89 U/L — ABNORMAL HIGH (ref 0–44)
AST: 194 U/L — ABNORMAL HIGH (ref 15–41)
Albumin: <1.5 g/dL — ABNORMAL LOW (ref 3.5–5.0)
Alkaline Phosphatase: 215 U/L — ABNORMAL HIGH (ref 38–126)
Anion gap: 6 (ref 5–15)
BUN: 45 mg/dL — ABNORMAL HIGH (ref 8–23)
CO2: 24 mmol/L (ref 22–32)
Calcium: 7.9 mg/dL — ABNORMAL LOW (ref 8.9–10.3)
Chloride: 115 mmol/L — ABNORMAL HIGH (ref 98–111)
Creatinine, Ser: 0.75 mg/dL (ref 0.61–1.24)
GFR, Estimated: >60 mL/min (ref >60)
Glucose, Bld: 170 mg/dL — ABNORMAL HIGH (ref 70–99)
Potassium: 4.4 mmol/L (ref 3.5–5.1)
Sodium: 145 mmol/L (ref 135–145)
Total Bilirubin: 2.1 mg/dL — ABNORMAL HIGH (ref 0.3–1.2)
Total Protein: 5 g/dL — ABNORMAL LOW (ref 6.5–8.1)

## 2021-09-27 LAB — CBC WITH DIFFERENTIAL/PLATELET
Abs Immature Granulocytes: 0 10*3/uL (ref 0.00–0.07)
Basophils Absolute: 0 10*3/uL (ref 0.0–0.1)
Basophils Relative: 0 %
Eosinophils Absolute: 0 10*3/uL (ref 0.0–0.5)
Eosinophils Relative: 0 %
HCT: 28.3 % — ABNORMAL LOW (ref 39.0–52.0)
Hemoglobin: 9.9 g/dL — ABNORMAL LOW (ref 13.0–17.0)
Lymphocytes Relative: 1 %
Lymphs Abs: 0.2 10*3/uL — ABNORMAL LOW (ref 0.7–4.0)
MCH: 34.5 pg — ABNORMAL HIGH (ref 26.0–34.0)
MCHC: 35 g/dL (ref 30.0–36.0)
MCV: 98.6 fL (ref 80.0–100.0)
Monocytes Absolute: 0 10*3/uL — ABNORMAL LOW (ref 0.1–1.0)
Monocytes Relative: 0 %
Neutro Abs: 17.3 10*3/uL — ABNORMAL HIGH (ref 1.7–7.7)
Neutrophils Relative %: 99 %
Platelets: 20 10*3/uL — CL (ref 150–400)
RBC: 2.87 MIL/uL — ABNORMAL LOW (ref 4.22–5.81)
RDW: 15.6 % — ABNORMAL HIGH (ref 11.5–15.5)
Smear Review: DECREASED
WBC Morphology: INCREASED
WBC: 17.5 10*3/uL — ABNORMAL HIGH (ref 4.0–10.5)
nRBC: 0 % (ref 0.0–0.2)

## 2021-09-27 LAB — GLUCOSE, CAPILLARY
Glucose-Capillary: 135 mg/dL — ABNORMAL HIGH (ref 70–99)
Glucose-Capillary: 148 mg/dL — ABNORMAL HIGH (ref 70–99)
Glucose-Capillary: 133 mg/dL — ABNORMAL HIGH (ref 70–99)
Glucose-Capillary: 137 mg/dL — ABNORMAL HIGH (ref 70–99)
Glucose-Capillary: 130 mg/dL — ABNORMAL HIGH (ref 70–99)
Glucose-Capillary: 131 mg/dL — ABNORMAL HIGH (ref 70–99)

## 2021-09-27 LAB — HCV RNA (INTERNATIONAL UNITS)
HCV RNA (International Units): 27200000 IU/mL
HCV log10: 7.435 log10 IU/mL

## 2021-09-27 LAB — HCV RNA QUANT

## 2021-09-27 LAB — HEPATITIS B SURFACE ANTIBODY, QUANTITATIVE: Hep B S AB Quant (Post): <3.1 m[IU]/mL — ABNORMAL LOW (ref >9.9)

## 2021-09-27 LAB — AFP TUMOR MARKER: AFP, Serum, Tumor Marker: <1.8 ng/mL (ref 0.0–8.4)

## 2021-09-27 MED ORDER — VECURONIUM BROMIDE 10 MG IV SOLR
INTRAVENOUS | Status: AC
Start: 2021-09-27 — End: 2021-09-27
  Administered 2021-09-27: 10 mg via INTRAVENOUS
  Filled 2021-09-27: qty 10

## 2021-09-27 MED ORDER — PROPOFOL 1000 MG/100ML IV EMUL
5.0000 ug/kg/min | INTRAVENOUS | Status: DC
  Administered 2021-09-27 (×3): 30 ug/kg/min via INTRAVENOUS
  Administered 2021-09-28: 25 ug/kg/min via INTRAVENOUS
  Administered 2021-09-28: 35 ug/kg/min via INTRAVENOUS
  Administered 2021-09-29: 25 ug/kg/min via INTRAVENOUS
  Administered 2021-09-29 (×2): 30 ug/kg/min via INTRAVENOUS
  Administered 2021-09-30 (×2): 35 ug/kg/min via INTRAVENOUS
  Administered 2021-10-01: 20 ug/kg/min via INTRAVENOUS
  Administered 2021-10-01 (×2): 40 ug/kg/min via INTRAVENOUS
  Administered 2021-10-02: 30 ug/kg/min via INTRAVENOUS
  Administered 2021-10-02 (×2): 25 ug/kg/min via INTRAVENOUS
  Filled 2021-09-27 (×17): qty 100

## 2021-09-27 MED ORDER — FUROSEMIDE 10 MG/ML IJ SOLN
40.0000 mg | Freq: Once | INTRAMUSCULAR | Status: AC
  Administered 2021-09-27: 40 mg via INTRAVENOUS
  Filled 2021-09-27: qty 4

## 2021-09-27 MED ORDER — MIDAZOLAM HCL 2 MG/2ML IJ SOLN
1.0000 mg | INTRAMUSCULAR | Status: DC | PRN
  Administered 2021-09-27: 1 mg via INTRAVENOUS
  Administered 2021-09-29: 2 mg via INTRAVENOUS
  Filled 2021-09-27 (×2): qty 2

## 2021-09-27 MED ORDER — ATROPINE SULFATE 1 MG/10ML IJ SOSY
PREFILLED_SYRINGE | INTRAMUSCULAR | Status: AC
Start: 2021-09-27 — End: 2021-09-27
  Filled 2021-09-27: qty 10

## 2021-09-27 MED ORDER — SODIUM CHLORIDE 0.9 % IV SOLN
INTRAVENOUS | Status: DC | PRN
Start: 2021-09-27 — End: 2021-10-23

## 2021-09-27 MED ORDER — NOREPINEPHRINE 4 MG/250ML-% IV SOLN
2.0000 ug/min | INTRAVENOUS | Status: DC
  Administered 2021-09-27: 2 ug/min via INTRAVENOUS
  Administered 2021-09-27: 9 ug/min via INTRAVENOUS
  Administered 2021-09-28: 2 ug/min via INTRAVENOUS
  Administered 2021-09-28 (×2): 10 ug/min via INTRAVENOUS
  Filled 2021-09-27 (×6): qty 250

## 2021-09-27 MED ORDER — SODIUM CHLORIDE 0.9 % IV SOLN
250.0000 mL | INTRAVENOUS | Status: DC
Start: 2021-09-27 — End: 2021-09-30

## 2021-09-27 MED ORDER — VECURONIUM BROMIDE 10 MG IV SOLR: 10.0000 mg | Freq: Once | INTRAVENOUS | Status: AC

## 2021-09-27 MED ORDER — METRONIDAZOLE 500 MG/100ML IV SOLN
500.0000 mg | Freq: Two times a day (BID) | INTRAVENOUS | Status: DC
  Administered 2021-09-27 – 2021-09-28 (×3): 500 mg via INTRAVENOUS
  Filled 2021-09-27 (×3): qty 100

## 2021-09-27 MED ORDER — INSULIN ASPART 100 UNIT/ML IJ SOLN
0.0000 [IU] | INTRAMUSCULAR | Status: DC
  Administered 2021-09-27 (×6): 1 [IU] via SUBCUTANEOUS
  Administered 2021-09-28 (×3): 2 [IU] via SUBCUTANEOUS
  Administered 2021-09-28 – 2021-09-29 (×3): 1 [IU] via SUBCUTANEOUS
  Administered 2021-09-29: 2 [IU] via SUBCUTANEOUS
  Administered 2021-09-29 (×3): 1 [IU] via SUBCUTANEOUS
  Administered 2021-09-30: 2 [IU] via SUBCUTANEOUS
  Administered 2021-09-30: 1 [IU] via SUBCUTANEOUS
  Administered 2021-09-30: 2 [IU] via SUBCUTANEOUS
  Administered 2021-09-30: 1 [IU] via SUBCUTANEOUS
  Administered 2021-09-30 – 2021-10-01 (×2): 2 [IU] via SUBCUTANEOUS
  Administered 2021-10-04 – 2021-10-05 (×5): 1 [IU] via SUBCUTANEOUS
  Administered 2021-10-05: 2 [IU] via SUBCUTANEOUS
  Administered 2021-10-05 (×2): 1 [IU] via SUBCUTANEOUS
  Administered 2021-10-06: 2 [IU] via SUBCUTANEOUS
  Administered 2021-10-06 – 2021-10-08 (×10): 1 [IU] via SUBCUTANEOUS
  Administered 2021-10-08: 2 [IU] via SUBCUTANEOUS
  Administered 2021-10-08 – 2021-10-09 (×5): 1 [IU] via SUBCUTANEOUS
  Administered 2021-10-09: 2 [IU] via SUBCUTANEOUS
  Administered 2021-10-09: 1 [IU] via SUBCUTANEOUS
  Administered 2021-10-09: 2 [IU] via SUBCUTANEOUS
  Administered 2021-10-10 (×5): 1 [IU] via SUBCUTANEOUS
  Administered 2021-10-10: 2 [IU] via SUBCUTANEOUS
  Administered 2021-10-11 – 2021-10-20 (×35): 1 [IU] via SUBCUTANEOUS
  Administered 2021-10-20: 2 [IU] via SUBCUTANEOUS
  Administered 2021-10-20 – 2021-10-29 (×24): 1 [IU] via SUBCUTANEOUS

## 2021-09-27 MED ORDER — FREE WATER
100.0000 mL | Freq: Four times a day (QID) | Status: DC
  Administered 2021-09-27 – 2021-09-28 (×4): 100 mL

## 2021-09-27 NOTE — Progress Notes (Signed)
Pts RR has been in 30s all morning, WOB still elevated. Pt was turned during bath, started coughing, RR went to 40s/50s, HR went down to 30s. Pts FiO2 turned to up to 100 percent, suctioned (no plug), precedex and fentanyl both turned off.  CCM MD called to bedside. HR quickly returned to NSR. BP wnl. Fentanyl restarted, prop started, 10mg  vec given once pt adequately sedated. Pts vitals now all wnl, wob is improved. Will continue to monitor closely.

## 2021-09-27 NOTE — Progress Notes (Signed)
[place] Progress Note Patient Name: Akiles Didonna DOB: [birth year] MRN: [id number]   Date of Service  09/27/2021  HPI/Events of Note  Multiple issues: 1. Tachypea - Breathing over set ventilator rate in spite of multiple boluses with Fentanyl. 2. Hyperglycemia - Blood glucose = 179  eICU Interventions  Plan: ABG STAT. Portable CXR STAT. Q 4 hour sensitive Novolog SSI.      Intervention Category Major Interventions: Respiratory failure - evaluation and management;Hyperglycemia - active titration of insulin therapy  Azaela Caracci Cornelia Copa 09/27/2021, 1:30 AM

## 2021-09-27 NOTE — Progress Notes (Signed)
°   09/27/21 1115  Clinical Encounter Type  Visited With Patient;Family;Health care provider  Visit Type Initial  Referral From Nurse  Consult/Referral To Chaplain  Spiritual Encounters  Spiritual Needs Emotional   Unit secretary referred Chaplain to speak with patient's daughter. Given her father's medical condition, daughter was distraught and very concerned. During visit, daughter spoke with medical resident who explained the current treatment and future hope for the patient's recovery. Chaplain was an active listener and assisted daughter by facilitating communication to ensure clarity on next steps regarding her father's condition. Daughter required emotional, physical, and spiritual support provided by [place] and medical care team.    Jon Gills [phone]

## 2021-09-27 NOTE — Procedures (Signed)
Arterial Catheter Insertion Procedure Note  Troy Sampson  [id number]  06-24-1956  Date:09/27/21  Time:2:16 PM    Provider Performing: Verdene Lennert    Procedure: Insertion of Arterial Line (26712) with US guidance (45809)   Indication(s) Blood pressure monitoring and/or need for frequent ABGs  Consent Risks of the procedure as well as the alternatives and risks of each were explained to the patient and/or caregiver.  Consent for the procedure was obtained and is signed in the bedside chart  Anesthesia Fentanyl infusion   Time Out Verified patient identification, verified procedure, site/side was marked, verified correct patient position, special equipment/implants available, medications/allergies/relevant history reviewed, required imaging and test results available.   Sterile Technique Maximal sterile technique including full sterile barrier drape, hand hygiene, sterile gown, sterile gloves, mask, hair covering, sterile ultrasound probe cover (if used).   Procedure Description Area of catheter insertion was cleaned with chlorhexidine and draped in sterile fashion. With real-time ultrasound guidance an arterial catheter was placed into the right radial artery.  Appropriate arterial tracings confirmed on monitor.     Complications/Tolerance None; patient tolerated the procedure well.   EBL Minimal   Specimen(s) None

## 2021-09-27 NOTE — Progress Notes (Signed)
Follow up ABG obtained on ventilator settings of VT: 400, RR: 35, FIO2: 100%, and PEEP: 8.0.  Decreased FIO2 to 90% due to MD instruction to wean if PaO2 >80.  Tolerating well at this time.  Will continue to monitor.    Latest Reference Range & Units 09/27/21 18:40  Sample type  ARTERIAL  pH, Arterial 7.35 - 7.45  7.221 (L)  pCO2 arterial 32 - 48 mmHg 64.6 (H)  pO2, Arterial 83 - 108 mmHg 97  TCO2 22 - 32 mmol/L 29  Acid-base deficit 0.0 - 2.0 mmol/L 2.0  Bicarbonate 20.0 - 28.0 mmol/L 26.6  O2 Saturation % 96  Patient temperature  98.4 F  Collection site  RADIAL, ALLEN'S TEST ACCEPTABLE

## 2021-09-27 NOTE — Progress Notes (Signed)
ABG ordered and obtained on ventilator settings of VT: 400, RR: 20, FIO2: 100%, and PEEP: 8.0.  Results given to MD and instructed to increase VT to 520 and RR to 35.  Will obtain follow up ABG post changes.     Latest Reference Range & Units 09/27/21 12:15  Sample type  ARTERIAL  pH, Arterial 7.35 - 7.45  7.073 (LL)  pCO2 arterial 32 - 48 mmHg 98.8 (HH)  pO2, Arterial 83 - 108 mmHg 79 (L)  TCO2 22 - 32 mmol/L 32  Acid-base deficit 0.0 - 2.0 mmol/L 2.0  Bicarbonate 20.0 - 28.0 mmol/L 28.9 (H)  O2 Saturation % 88  Patient temperature  98.4 F  Collection site  RADIAL, ALLEN'S TEST ACCEPTABLE

## 2021-09-27 NOTE — Progress Notes (Signed)
Follow up ABG obtained on ventilator settings of VT: 520, RR: 35, FIO2: 100%, and PEEP: 8.0.  Results given to MD and instructed to place VT at 460 (patient's 7cc).  Will get follow up ABG.    Latest Reference Range & Units 09/27/21 13:38  Sample type  ARTERIAL  pH, Arterial 7.35 - 7.45  7.297 (L)  pCO2 arterial 32 - 48 mmHg 53.7 (H)  pO2, Arterial 83 - 108 mmHg 78 (L)  TCO2 22 - 32 mmol/L 28  Acid-base deficit 0.0 - 2.0 mmol/L 1.0  Bicarbonate 20.0 - 28.0 mmol/L 26.4  O2 Saturation % 94  Patient temperature  97.5 F  Collection site  art line

## 2021-09-27 NOTE — Progress Notes (Addendum)
eLink Physician-Brief Progress Note Patient Name: Troy Sampson DOB: 08-23-1955 MRN: [id number]   Date of Service  09/27/2021  HPI/Events of Note  ABG on 60%/PRVC 20/TV 400/P 5 = 7.34/47.3/61/25.7. Patient is breathing over the set ventilator rate at 32-35. CXR reveals small bilateral pleural effusions and bilateral pulmonary opacities which may represent edema, pneumonia, or combination. Patient is 10 liters positive fluid balance since admission. ETT is 1.5 cm above the carina to my read.  eICU Interventions  Plan: Lasix 40 mg IV X 1 now.  Increase PEEP to 8. Pull ETT back by 2 cm.      Intervention Category Major Interventions: Respiratory failure - evaluation and management  [place] Semrad Eugene 09/27/2021, 2:45 AM

## 2021-09-27 NOTE — Progress Notes (Signed)
NAME:  Troy Sampson, MRN:  [id number], DOB:  [birth year], LOS: 5 ADMISSION DATE:  09/21/2021, CONSULTATION DATE:  09/22/2021 REFERRING MD: Dr. Tamera Punt CHIEF COMPLAINT:  SOB   History of Present Illness:  [name] is a 66 y/o male with a PMHx of duodenal ulcer, IDA, nephrolithiasis who presented to the ED with SOB. On arrival, he was obtained 2/2 severe hypoxia and subsequently intubated emergently.   Per family and chart review, patient is a smoker but no know history of COPD. No past medical history of alcohol abuse.   Pertinent  Medical History  Duodenal ulcer with IDA  Significant Hospital Events: Including procedures, antibiotic start and stop dates in addition to other pertinent events   2/22: Admitted overnight to ICU. Blood cultures 3/4 positive for strep pneumo. Strep pneumo antigen positive  2/26: New onset fevers. Antibiotics broadened to Cefepime and Vancomycin. Propofol switched to Precedex.  2/27: Bradycardia noted on Precedex with increased tachypnea.   Interim History / Subjective:   Overnight, RN noted significant tachypnea with patient breathing over vent. CXR demonstrated bilateral pleural effusions and opacities; one time dose of Lasix given.  ABG showed pH 7.34, pCO2 47 and pO2 61. PEEP increased from 5 to 8.   Objective   Blood pressure 97/61, pulse 84, temperature 98.4 F (36.9 C), temperature source Axillary, resp. rate (!) 34, height 5\' 7"  (1.702 m), weight 54.2 kg, SpO2 (!) 89 %.    Vent Mode: PRVC FiO2 (%):  [50 %-70 %] 60 % Set Rate:  [20 bmp] 20 bmp Vt Set:  [400 mL-520 mL] 400 mL PEEP:  [5 cmH20-8 cmH20] 8 cmH20 Plateau Pressure:  [21 cmH20-22 cmH20] 22 cmH20   Intake/Output Summary (Last 24 hours) at 09/27/2021 0744 Last data filed at 09/27/2021 0600 Gross per 24 hour  Intake 2468.32 ml  Output 1950 ml  Net 518.32 ml    Filed Weights   09/25/21 0500 09/26/21 0321 09/27/21 0414  Weight: 55.6 kg 55 kg 54.2 kg   Examination:  General: Acute  ill-appearing gentleman, appears stated age. Bitemporal wasting noted.  HENT: Moist mucus membranes.  Lungs: Bilateral lower lung fields with fine crackles, some rhonchi present as well. No wheezing today.  Cardiovascular: Regular rhythm and rate. No murmurs  Abdomen: Soft, non-distended. Normoactive bowel sounds.  Extremities: Warm to touch. No deformities noted Neuro: No response to noxious stimuli.  GU: Deferred   Resolved Hospital Problem list   # Lactic Acidosis   Assessment & Plan:   # Severe Sepsis 2/2 Strep Pneumo CAP + Bacteremia  - CT chest with evidence of significant multi-focal pneumonia with blood cultures positive for strep pneumo - Hold off on LP at this time  - TTE without evidence of endocarditis. If repeat blood cultures are positive for strep pneumo, may need to discuss TEE. - Continue broad antibiotics to Cefepime and Vancomycin  - Follow blood cultures results. NGTD @ 2 days.  - Follow tracheal aspirate culture results. Gram stain with gram positive coccobacillius  # Acute Hypoxic and Hypercapnic Respiratory Failure 2/2 Community Acquired Pneumonia # Suspected COPD - Severe multi-focal pneumonia with potential development of ARDS. ABG pending. - Continue full vent support with ARDS protocol of TV 6 cc/kg - One time dose of Vecuronium for severe tachypnea over vent - One time dose of Lasix - Will consider proning pending ABG results  - Not stable for SBTs - VAP bundle  - Continue broad spectrum antibiotics: Cefepime and Vancomycin  - Continue Duonebs  PRN  # Acute Encephalopathy Initial suspicion that encephalopathy is secondary to sepsis, however daily wake trials have been limited and patient is requiring high doses of sedation. Differential includes alcohol withdrawal versus less-likely meningitis from strep bacteremia.   - Continue Klonopin 1 mg BID  - Continue Seroquel  - Change Precedex to Propofol due to bradycardia - Continue Fentanyl.  # Acute  Hypotension Likely medication induced.  - Start peripheral Levophed PRN to maintain MAP > 65  # Severe Thrombocytopenia # Normocytic Anemia  # History of IDA - Potentially secondary to severe illness + sepsis. DIC panel negative x 3.  - S/p 3 units of pheresed platelets - Platelets slowly improving  No indication for repeat transfusion - CBC daily  - Hold DVT ppx and Aspirin   # Liver Masses  # Elevated LFTs 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 PSA minimally elevated so low suspicion for that being primary. AFP negative. Discussed pursuing IR biopsy in the future with family but given critical illness, patient is not currently stable enough. LFTs stable today.   - Hep C RNA quant pending - Monitor daily LFTs  # Urinary Retention Likely medication related. No hydronephrosis on imaging or AKI. Bladder scan with 293 today.   - Continue bladder scans q8h - In/out cath for volume > 300 cc - Place foley if more than 3 in/out required  # Demand Ischemia  # Elevated BNP  - No prior known cardiac history nor HTN - EKG with findings consistent with LVH but no acute ischemic changes  - TTE with preserved EF  # Electrolyte Abnormalities: Hypophosphatemia, Hypokalemia, Hypernatremia - Monitor daily and replete PRN - Start free water today  Best Practice (right click and "Reselect all SmartList Selections" daily)   Diet/type: NPO.  Tube Feeds DVT prophylaxis: SCD GI prophylaxis: PPI Lines: N/A Foley:  N/A Code Status:  full code Last date of multidisciplinary goals of care discussion [Wife and daughter updated at bedside on 2/25]  Labs   CBC: Recent Labs  Lab 09/21/21 1902 09/21/21 1943 09/23/21 2249 09/24/21 0254 09/25/21 0243 09/26/21 0039 09/27/21 0121 09/27/21 0225  WBC 2.5*   < > 8.8 10.4 15.7* 18.9* 17.5*  --   NEUTROABS 2.4   --  8.5*  --   --  18.3* 17.3*  --   HGB 13.9   < > 11.5* 10.2* 10.1* 9.4* 9.9* 10.9*  HCT 38.1*   < > 33.1* 29.7* 28.5* 26.8* 28.3* 32.0*  MCV 95.7   < > 99.1 99.7 98.6 97.8 98.6  --   PLT 76*   < > 10* 56* 20* 22* 20*  --    < > = values in this interval not displayed.     Basic Metabolic Panel: Recent Labs  Lab 09/22/21 1959 09/23/21 0640 09/23/21 1556 09/24/21 0254 09/25/21 0243 09/26/21 0039 09/27/21 0121 09/27/21 0225  NA  --  134*  --  139 138 142 145 147*  K  --  3.2*  --  3.3* 3.8 4.3 4.4 4.4  CL  --  101  --  105 107 109 115*  --   CO2  --  25  --  26 25 24 24   --   GLUCOSE  --  130*  --  101* 100* 120* 170*  --   BUN  --  20  --  21 27*  36* 45*  --   CREATININE  --  0.79  --  0.65 0.62 0.76 0.75  --   CALCIUM  --  8.3*  --  8.3* 8.2* 7.9* 7.9*  --   MG 1.7 2.2 2.1 2.1 2.3  --   --   --   PHOS 1.4* 2.2* 2.4*  --  2.3* 3.2  --   --     GFR: Estimated Creatinine Clearance: 70.6 mL/min (by C-G formula based on SCr of 0.75 mg/dL). Recent Labs  Lab 09/22/21 0926 09/23/21 0640 09/23/21 0843 09/23/21 1556 09/24/21 0254 09/24/21 0700 09/25/21 0243 09/26/21 0039 09/27/21 0121  WBC  --    < >  --    < > 10.4  --  15.7* 18.9* 17.5*  LATICACIDVEN 4.2*  --  3.1*  --   --  2.2* 1.6  --   --    < > = values in this interval not displayed.     Liver Function Tests: Recent Labs  Lab 09/23/21 0640 09/24/21 0254 09/25/21 0243 09/26/21 0039 09/27/21 0121  AST 160* 198* 277* 268* 194*  ALT 40 47* 79* 89* 89*  ALKPHOS 41 87 165* 221* 215*  BILITOT 0.9 1.3* 1.7* 2.0* 2.1*  PROT 4.7* 5.0* 4.6* 4.5* 5.0*  ALBUMIN <1.5* 1.5* <1.5* <1.5* <1.5*    No results for input(s): LIPASE, AMYLASE in the last 168 hours. No results for input(s): AMMONIA in the last 168 hours.  ABG    Component Value Date/Time   PHART 7.343 (L) 09/27/2021 0225   PCO2ART 47.3 09/27/2021 0225   PO2ART 61 (L) 09/27/2021 0225   HCO3 25.7 09/27/2021 0225   TCO2 27 09/27/2021 0225    ACIDBASEDEF 3.0 (H) 09/22/2021 1250   O2SAT 89 09/27/2021 0225      Coagulation Profile: Recent Labs  Lab 09/21/21 1902 09/21/21 2207 09/23/21 0843 09/24/21 0254 09/26/21 0039  INR 1.0 1.1 1.1 1.1 1.1     Cardiac Enzymes: Recent Labs  Lab 09/22/21 1959 09/23/21 0843  CKTOTAL 588* 508*     HbA1C: Hgb A1c MFr Bld  Date/Time Value Ref Range Status  09/27/2021 01:54 AM 5.0 4.8 - 5.6 % Final    Comment:    (NOTE) Pre diabetes:          5.7%-6.4%  Diabetes:              >6.4%  Glycemic control for   <7.0% adults with diabetes     CBG: Recent Labs  Lab 09/26/21 1514 09/26/21 2003 09/26/21 2315 09/27/21 0337 09/27/21 0740  GLUCAP 154* 172* 179* 135* 148*    Review of Systems:   Negative except as noted above.    Signed,  Dr. Jose Persia Internal Medicine PGY-3  09/27/2021, 7:44 AM

## 2021-09-27 NOTE — Progress Notes (Addendum)
Interval Progress Note:   - Update provided to family today. Discussed goals of care in light of patient's worsening clinical status with severe multi-focal pneumonia with developing ARDS, as well as discovery of suspected COPD and potential underlying malignancy and Hepatitis C. Mrs. Elenes is firm in that she would like aggressive care pursued. She confirms he is full code.   - Patient's daughter, Maryjean Ka, discloses that patient has a previous history of IVDA when he was in the [employer] approximately 30 years ago. She is unsure if he was ever tested for HIV or Hepatitis C. Recently, her fathers friend disclosed that patient had been taking Percocet and became worried about dependence. He began take half a tab of Suboxone, obtained from non-prescription source.   - We discussed patient's positive Hepatitis C antibody. RNA quant levels still pending   Signed,  Dr. Verdene Lennert Internal Medicine PGY-3  09/27/2021, 4:49 PM

## 2021-09-27 NOTE — Procedures (Signed)
Cortrak  Person Inserting Tube:  Troy Sampson, Troy Sampson, RD Tube Type:  Cortrak - 43 inches Tube Size:  10 Tube Location:  Left nare Initial Placement:  Stomach Secured by: Bridle Technique Used to Measure Tube Placement:  Marking at nare/corner of mouth Cortrak Secured At:  89 cm  Cortrak Tube Team Note:  Consult received to place a Cortrak feeding tube.   X-ray is required, abdominal x-ray has been ordered by the Cortrak team. Please confirm tube placement before using the Cortrak tube.   If the tube becomes dislodged please keep the tube and contact the Cortrak team at [email or web address] ([email or web address]) for replacement.  If after hours and replacement cannot be delayed, place a NG tube and confirm placement with an abdominal x-ray.    [name] RD, LDN Clinical Dietitian See Loretha Stapler for contact information.

## 2021-09-27 NOTE — Progress Notes (Signed)
ABG obtained on ventilator settings of VT: 460, RR: 35, FIO2: 100%, and PEEP: 8.0.  MD notified, VT dropped to 400 (patient's 6cc).  Will get follow up ABG.    Latest Reference Range & Units 09/27/21 15:54  Sample type  ARTERIAL  pH, Arterial 7.35 - 7.45  7.263 (L)  pCO2 arterial 32 - 48 mmHg 58.9 (H)  pO2, Arterial 83 - 108 mmHg 116 (H)  TCO2 22 - 32 mmol/L 28  Acid-base deficit 0.0 - 2.0 mmol/L 1.0  Bicarbonate 20.0 - 28.0 mmol/L 26.7  O2 Saturation % 98  Patient temperature  98.4 F  Collection site  art line

## 2021-09-28 ENCOUNTER — Inpatient Hospital Stay (HOSPITAL_COMMUNITY): Payer: Medicare Other

## 2021-09-28 DIAGNOSIS — Z720 Tobacco use: Secondary | ICD-10-CM

## 2021-09-28 DIAGNOSIS — E43 Unspecified severe protein-calorie malnutrition: Secondary | ICD-10-CM

## 2021-09-28 DIAGNOSIS — J9600 Acute respiratory failure, unspecified whether with hypoxia or hypercapnia: Secondary | ICD-10-CM

## 2021-09-28 LAB — POCT I-STAT 7, (LYTES, BLD GAS, ICA,H+H)
Acid-Base Excess: 0 mmol/L (ref 0.0–2.0)
Acid-base deficit: 1 mmol/L (ref 0.0–2.0)
Bicarbonate: 27 mmol/L (ref 20.0–28.0)
Bicarbonate: 27.3 mmol/L (ref 20.0–28.0)
Calcium, Ion: 1.18 mmol/L (ref 1.15–1.40)
Calcium, Ion: 1.2 mmol/L (ref 1.15–1.40)
HCT: 24 % — ABNORMAL LOW (ref 39.0–52.0)
HCT: 21 % — ABNORMAL LOW (ref 39.0–52.0)
Hemoglobin: 8.2 g/dL — ABNORMAL LOW (ref 13.0–17.0)
Hemoglobin: 7.1 g/dL — ABNORMAL LOW (ref 13.0–17.0)
O2 Saturation: 93 %
O2 Saturation: 99 %
Patient temperature: 103.2
Patient temperature: 98.1
Potassium: 5.2 mmol/L — ABNORMAL HIGH (ref 3.5–5.1)
Potassium: 5 mmol/L (ref 3.5–5.1)
Sodium: 150 mmol/L — ABNORMAL HIGH (ref 135–145)
Sodium: 150 mmol/L — ABNORMAL HIGH (ref 135–145)
TCO2: 29 mmol/L (ref 22–32)
TCO2: 29 mmol/L (ref 22–32)
pCO2 arterial: 71.7 mmHg (ref 32–48)
pCO2 arterial: 63.4 mmHg — ABNORMAL HIGH (ref 32–48)
pH, Arterial: 7.197 — CL (ref 7.35–7.45)
pH, Arterial: 7.24 — ABNORMAL LOW (ref 7.35–7.45)
pO2, Arterial: 97 mmHg (ref 83–108)
pO2, Arterial: 192 mmHg — ABNORMAL HIGH (ref 83–108)

## 2021-09-28 LAB — COMPREHENSIVE METABOLIC PANEL
ALT: 101 U/L — ABNORMAL HIGH (ref 0–44)
AST: 197 U/L — ABNORMAL HIGH (ref 15–41)
Albumin: <1.5 g/dL — ABNORMAL LOW (ref 3.5–5.0)
Alkaline Phosphatase: 268 U/L — ABNORMAL HIGH (ref 38–126)
Anion gap: 8 (ref 5–15)
BUN: 64 mg/dL — ABNORMAL HIGH (ref 8–23)
CO2: 25 mmol/L (ref 22–32)
Calcium: 7.4 mg/dL — ABNORMAL LOW (ref 8.9–10.3)
Chloride: 115 mmol/L — ABNORMAL HIGH (ref 98–111)
Creatinine, Ser: 1.18 mg/dL (ref 0.61–1.24)
GFR, Estimated: >60 mL/min (ref >60)
Glucose, Bld: 126 mg/dL — ABNORMAL HIGH (ref 70–99)
Potassium: 5.2 mmol/L — ABNORMAL HIGH (ref 3.5–5.1)
Sodium: 148 mmol/L — ABNORMAL HIGH (ref 135–145)
Total Bilirubin: 2.4 mg/dL — ABNORMAL HIGH (ref 0.3–1.2)
Total Protein: 4.9 g/dL — ABNORMAL LOW (ref 6.5–8.1)

## 2021-09-28 LAB — CULTURE, RESPIRATORY W GRAM STAIN: Culture: NORMAL

## 2021-09-28 LAB — VANCOMYCIN, TROUGH: Vancomycin Tr: 13 ug/mL — ABNORMAL LOW (ref 15–20)

## 2021-09-28 LAB — GLUCOSE, CAPILLARY
Glucose-Capillary: 130 mg/dL — ABNORMAL HIGH (ref 70–99)
Glucose-Capillary: 145 mg/dL — ABNORMAL HIGH (ref 70–99)
Glucose-Capillary: 151 mg/dL — ABNORMAL HIGH (ref 70–99)
Glucose-Capillary: 155 mg/dL — ABNORMAL HIGH (ref 70–99)
Glucose-Capillary: 174 mg/dL — ABNORMAL HIGH (ref 70–99)
Glucose-Capillary: 186 mg/dL — ABNORMAL HIGH (ref 70–99)

## 2021-09-28 LAB — CBC WITH DIFFERENTIAL/PLATELET
Abs Immature Granulocytes: 0.22 10*3/uL — ABNORMAL HIGH (ref 0.00–0.07)
Basophils Absolute: 0 10*3/uL (ref 0.0–0.1)
Basophils Relative: 0 %
Eosinophils Absolute: 0 10*3/uL (ref 0.0–0.5)
Eosinophils Relative: 0 %
HCT: 23.6 % — ABNORMAL LOW (ref 39.0–52.0)
Hemoglobin: 8.1 g/dL — ABNORMAL LOW (ref 13.0–17.0)
Immature Granulocytes: 1 %
Lymphocytes Relative: 4 %
Lymphs Abs: 0.8 10*3/uL (ref 0.7–4.0)
MCH: 34.5 pg — ABNORMAL HIGH (ref 26.0–34.0)
MCHC: 34.3 g/dL (ref 30.0–36.0)
MCV: 100.4 fL — ABNORMAL HIGH (ref 80.0–100.0)
Monocytes Absolute: 0.2 10*3/uL (ref 0.1–1.0)
Monocytes Relative: 1 %
Neutro Abs: 19.4 10*3/uL — ABNORMAL HIGH (ref 1.7–7.7)
Neutrophils Relative %: 94 %
Platelets: 35 10*3/uL — ABNORMAL LOW (ref 150–400)
RBC: 2.35 MIL/uL — ABNORMAL LOW (ref 4.22–5.81)
RDW: 16.3 % — ABNORMAL HIGH (ref 11.5–15.5)
Smear Review: DECREASED
WBC Morphology: INCREASED
WBC: 20.7 10*3/uL — ABNORMAL HIGH (ref 4.0–10.5)
nRBC: 0.2 % (ref 0.0–0.2)

## 2021-09-28 LAB — TRIGLYCERIDES: Triglycerides: 168 mg/dL — ABNORMAL HIGH (ref <150)

## 2021-09-28 MED ORDER — FREE WATER: 200.0000 mL | Freq: Four times a day (QID) | Status: DC

## 2021-09-28 MED ORDER — FREE WATER
200.0000 mL | Status: DC
  Administered 2021-09-28 – 2021-10-01 (×16): 200 mL

## 2021-09-28 MED ORDER — PENICILLIN G POTASSIUM 20000000 UNITS IJ SOLR
12.0000 10*6.[IU] | Freq: Two times a day (BID) | INTRAVENOUS | Status: AC
  Administered 2021-09-28 – 2021-10-05 (×15): 12 10*6.[IU] via INTRAVENOUS
  Filled 2021-09-28 (×4): qty 12
  Filled 2021-09-28: qty 5
  Filled 2021-09-28 (×2): qty 12
  Filled 2021-09-28: qty 10
  Filled 2021-09-28 (×2): qty 12
  Filled 2021-09-28 (×2): qty 5
  Filled 2021-09-28 (×4): qty 12

## 2021-09-28 MED ORDER — VECURONIUM BROMIDE 10 MG IV SOLR
10.0000 mg | Freq: Once | INTRAVENOUS | Status: AC
  Administered 2021-09-28: 10 mg via INTRAVENOUS
  Filled 2021-09-28: qty 10

## 2021-09-28 NOTE — Progress Notes (Signed)
ABG ordered and obtained on ventilator settings of VT: 460, RR: 35, FIO2: 100%, and PEEP: 8.  Results given to MD and VT increased to 500 and FIO2 dropped to 80%.  Tolerating well at this time.  Will continue to monitor and wean as tolerated.   Latest Reference Range & Units 09/28/21 15:05  Sample type  ARTERIAL  pH, Arterial 7.35 - 7.45  7.240 (L)  pCO2 arterial 32 - 48 mmHg 63.4 (H)  pO2, Arterial 83 - 108 mmHg 192 (H)  TCO2 22 - 32 mmol/L 29  Acid-Base Excess 0.0 - 2.0 mmol/L 0.0  Bicarbonate 20.0 - 28.0 mmol/L 27.3  O2 Saturation % 99  Patient temperature  98.1 F  Collection site  art line

## 2021-09-28 NOTE — Procedures (Signed)
Central Venous Catheter Insertion Procedure Note  Wm Sahagun  [id number]  [birth year]  Date:09/28/21  Time:11:40 AM   Provider Performing:Stephaun Million Huel Cote   Procedure: Insertion of Non-tunneled Central Venous [phone]) with US guidance (56213)   Indication(s) Medication administration  Consent Risks of the procedure as well as the alternatives and risks of each were explained to the patient and/or caregiver.  Consent for the procedure was obtained and is signed in the bedside chart  Anesthesia Topical only with 1% lidocaine   Timeout Verified patient identification, verified procedure, site/side was marked, verified correct patient position, special equipment/implants available, medications/allergies/relevant history reviewed, required imaging and test results available.  Sterile Technique Maximal sterile technique including full sterile barrier drape, hand hygiene, sterile gown, sterile gloves, mask, hair covering, sterile ultrasound probe cover (if used).  Procedure Description Area of catheter insertion was cleaned with chlorhexidine and draped in sterile fashion.  With real-time ultrasound guidance a central venous catheter was placed into the left internal jugular vein. Nonpulsatile blood flow and easy flushing noted in all ports.  The catheter was sutured in place and sterile dressing applied.  Complications/Tolerance None; patient tolerated the procedure well. Chest X-ray is ordered to verify placement for internal jugular or subclavian cannulation.   Chest x-ray is not ordered for femoral cannulation.  EBL Minimal  Specimen(s) None

## 2021-09-28 NOTE — Progress Notes (Signed)
Nutrition Follow-up  DOCUMENTATION CODES:   Severe malnutrition in context of chronic illness  INTERVENTION:   Continue tube feeds via Cortrak: - Vital 1.5 @ 50 ml/hr (1200 ml/day) - ProSource TF 45 ml daily - Free water flushes per CCM, currently 200 ml q 4 hours  Tube feeding regimen provides 1840 kcal, 92 grams of protein, and 912 ml of H2O.  Total free water with flushes: 2112 ml  - Continue MVI with minerals daily per tube  NUTRITION DIAGNOSIS:   Severe Malnutrition related to chronic illness as evidenced by severe muscle depletion, severe fat depletion.  Ongoing, being addressed via TF  GOAL:   Patient will meet greater than or equal to 90% of their needs  Met via TF  MONITOR:   Vent status, Labs, Weight trends, TF tolerance, I & O's  REASON FOR ASSESSMENT:   Consult, Ventilator Enteral/tube feeding initiation and management  ASSESSMENT:   Pt with active tobacco dependence and PMH of duodenal ulcer, IDA, and nephrolithiasis admitted with severe acute hypoxic and hypercapnic respiratory failure due to CAP.  02/27 - Cortrak placed (tip gastric, near pylorus)  Discussed pt with RN and during ICU rounds. Per notes, pt with history of EtOH abuse and previous history of IVDA when pt was in the [employer] per daughter's reports. Pt now with diagnosis of hepatitis C.  Per RN, pt tolerating tube feeds at goal rate. Will continue with current tube feeding regimen.  Current TF: Vital 1.5 @ 50 ml/hr, ProSource TF 45 ml daily, free water flushes of 200 ml q 4 hours  Admit weight: 46.3 kg Current weight: 54.2 kg  Weight up ~8 kg since admission. Suspect weight gain related to positive fluid balance, Pt with generalized edema.  Patient remains intubated on ventilator support MV: 15.7 L/min Temp (24hrs), Avg:99.4 F (37.4 C), Min:97.9 F (36.6 C), Max:103.2 F (39.6 C) BP (a-line): 119/45 MAP (a-line): 65  Drips: Propofol: 9.8 ml/hr (provides 259 kcal daily from  lipid) Fentanyl Levophed  Medications reviewed and include: folic acid, SSI q 4 hours, MVI with minerals daily, protonix, thiamine, IV abx  Vitamin/Mineral Profile: Vitamin B12: 707 (WNL) Folate B9: 5.4 (low) Ferritin: 674 (high)  Labs reviewed: sodium 150, potassium 5.2, BUN 64, elevated LFTs, TG 168, WBC 20.7, hemoglobin 8.2, platelets 35 CBG's: 130-155 x 24 hours  UOP: 2480 ml x 24 hours I/O's: +11.1 L since admit  Diet Order:   Diet Order             Diet NPO time specified  Diet effective now                   EDUCATION NEEDS:   Not appropriate for education at this time  Skin:  Skin Assessment: Reviewed RN Assessment  Last BM:  09/28/21 rectal tube  Height:   Ht Readings from Last 1 Encounters:  09/22/21 _0  (1.702 m)    Weight:   Wt Readings from Last 1 Encounters:  09/27/21 54.2 kg    BMI:  Body mass index is 18.71 kg/m.  Estimated Nutritional Needs:   Kcal:  1700-1900  Protein:  80-95 grams  Fluid:  >1.7 L/day    Gustavus Bryant, MS, RD, LDN Inpatient Clinical Dietitian Please see AMiON for contact information.

## 2021-09-28 NOTE — Progress Notes (Signed)
Pharmacy Antibiotic Note  Troy Sampson is a 66 y.o. male admitted on 09/21/2021 with streptococcus pneumoniae bacteremia, now with concern for pneumonia.  Pharmacy has been consulted for Vancomycin and Cefepime dosing.  2/28 Vancomycin tr 13 mcg/mL Renal function 0.75>>0.18   Plan: Infectious disease consulted 2/28 Continue Vancomycin 1250mg  q24hr and cefepime 2gm q8hr today F/u renal function tomorrow for further changes Plan to obtain repeat levels later this week if therapy continued  Will monitor for acute changes in renal function and adjust as needed F/u cultures results and de-escalate as appropriate   Height: 5\' 7"  (170.2 cm) Weight: 54.2 kg (119 lb 7.8 oz) IBW/kg (Calculated) : 66.1  Temp (24hrs), Avg:99.4 F (37.4 C), Min:97.9 F (36.6 C), Max:103.2 F (39.6 C)  Recent Labs  Lab 09/22/21 0612 09/22/21 0926 09/23/21 0640 09/23/21 0843 09/23/21 1556 09/24/21 0254 09/24/21 0700 09/25/21 0243 09/26/21 0039 09/27/21 0121 09/28/21 0321 09/28/21 1119  WBC  --   --    < >  --    < > 10.4  --  15.7* 18.9* 17.5* 20.7*  --   CREATININE  --   --    < >  --   --  0.65  --  0.62 0.76 0.75 1.18  --   LATICACIDVEN 3.6* 4.2*  --  3.1*  --   --  2.2* 1.6  --   --   --   --   VANCOTROUGH  --   --   --   --   --   --   --   --   --   --   --  13*   < > = values in this interval not displayed.     Estimated Creatinine Clearance: 47.8 mL/min (by C-G formula based on SCr of 1.18 mg/dL).    No Known Allergies  Antimicrobials this admission: 2/21 CTX (dose incr 2/23) >>2/26 2/21 azithro x 1 2/23 vanc >>2/24; 2/26 >> 2/26 Cefepime >> 2/27 Metronid>>  Dose adjustments this admission:   Microbiology results: 2/21 BCx - strep pneumo 3/3 (pen-s) 2/21 strep pneumo urinary antigen+ 2/21 legionella - neg 2/22 mrsa pcr - neg 2/25 BCx >>NGTD 2/26 RespCx >>normal flora  Thank you for allowing pharmacy to be a part of this patients care.  3/25, PharmD Clinical  Pharmacist  Please check AMION for all [facility] Pharmacy numbers After 10:00 PM, call Main Pharmacy [phone]

## 2021-09-28 NOTE — Progress Notes (Signed)
°   09/28/21 1715  Clinical Encounter Type  Visited With Patient and family together  Visit Type Follow-up;Spiritual support  Referral From Chaplain  Consult/Referral To Chaplain   Chaplain Tery Sanfilippo followed up per request of [employer]. The patient's wife and his sister were at the bedside. Chaplain offered prayer and hospitality in giving the patient a "thinking of you"card. Mrs. expressed appreciation of chaplain's visit. This note was prepared by Deneen Harts, M.Div..  For questions please contact by phone [phone].

## 2021-09-28 NOTE — Progress Notes (Signed)
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 Elink called re: ABG pH 7.19/Pco2 71/Po2 97/Bicarb 27   Plans: - repeat one time dose of Vecuronium to better sync with vent  - seems to have autoPEEP by virtue of waveform on ventilator - d/w RN- asked to allow exhalation of trapped gas with brief vent disconnect - if plateau pressures are ok, < 30, can increase TV slightly to 420 - 450 if tolerated. Alternatively can change to presssure control vent mode

## 2021-09-28 NOTE — Progress Notes (Signed)
RT NOTE:  Critical ABG results reported to ELINK.  

## 2021-09-28 NOTE — Progress Notes (Addendum)
NAME:  Troy Sampson, MRN:  [id number], DOB:  01/28/1956, LOS: 6 ADMISSION DATE:  09/21/2021, CONSULTATION DATE:  09/22/2021 REFERRING MD: Dr. Fredderick Phenix CHIEF COMPLAINT:  SOB   History of Present Illness:  Troy Sampson is a 66 y/o male with a PMHx of duodenal ulcer, IDA, nephrolithiasis who presented to the ED with SOB. On arrival, he was obtained 2/2 severe hypoxia and subsequently intubated emergently.   Per family and chart review, patient is a smoker but no know history of COPD. No past medical history of alcohol abuse.   Pertinent  Medical History  Duodenal ulcer with IDA  Significant Hospital Events: Including procedures, antibiotic start and stop dates in addition to other pertinent events   2/22: Admitted overnight to ICU. Blood cultures 3/4 positive for strep pneumo. Strep pneumo antigen positive  2/26: New onset fevers. Antibiotics broadened to Cefepime and Vancomycin. Propofol switched to Precedex.  2/27: Bradycardia noted on Precedex with increased tachypnea.   Interim History / Subjective:   Overnight, required Vecuronium due to vent dyssynchrony. ABG obtained that showed pH of 7.19, pCO2 71, pO2 97. TV increased at the time.   Hepatitis C viral load positive.   Objective   Blood pressure 107/62, pulse 80, temperature 97.9 F (36.6 C), temperature source Oral, resp. rate (!) 35, height 5\' 7"  (1.702 m), weight 54.2 kg, SpO2 99 %.    Vent Mode: PRVC FiO2 (%):  [80 %-100 %] 80 % Set Rate:  [20 bmp-35 bmp] 35 bmp Vt Set:  [400 mL-520 mL] 460 mL PEEP:  [8 cmH20] 8 cmH20 Plateau Pressure:  [20 cmH20-29 cmH20] 20 cmH20   Intake/Output Summary (Last 24 hours) at 09/28/2021 1012 Last data filed at 09/28/2021 0700 Gross per 24 hour  Intake 3428.79 ml  Output 2280 ml  Net 1148.79 ml    Filed Weights   09/25/21 0500 09/26/21 0321 09/27/21 0414  Weight: 55.6 kg 55 kg 54.2 kg   Examination:  General: Acute ill-appearing gentleman, appears stated age. Bitemporal wasting  noted.  HENT: Moist mucus membranes.  Lungs: Left lower lung field with coarse crackles. No rhonchi or wheezing.  Cardiovascular: Regular rhythm and rate. No murmurs  Abdomen: Soft, non-distended. Normoactive bowel sounds.  Extremities: Warm to touch. No deformities noted Neuro: Sedated.  GU: Deferred   Resolved Hospital Problem list   # Lactic Acidosis   Assessment & Plan:   # Severe Sepsis 2/2 Strep Pneumo CAP + Bacteremia  - CT chest with evidence of significant multi-focal pneumonia with blood cultures positive for strep pneumo - TTE without evidence of endocarditis. If repeat blood cultures are positive for strep pneumo, may need to discuss TEE. Will discuss with ID today - Tracheal aspirate cultures negative for abnormal flora.  - Continue broad antibiotics to Cefepime and Vancomycin  - Follow blood cultures results. NGTD @ 3 days.  - ID consultation given worsening in clinical status   # Acute Hypoxic and Hypercapnic Respiratory Failure 2/2 Community Acquired Pneumonia # Suspected COPD - Severe multi-focal pneumonia with potential development of ARDS. Unfortunately, new onset fevers in the last 72 hours concerning for lack of source control.  - Continue full vent support with ARDS protocol of TV 6 cc/kg - Continue PRN Vecuronium  - Hold off on further doses of lasix given appears hypovolemic  - Will consider proning if worsening in P/F ratio  - Not stable for SBTs - VAP bundle  - Continue broad spectrum antibiotics: Cefepime and Vancomycin  - Continue Duonebs  PRN - Will pursue bronchoscopy today   # Acute Encephalopathy Likely sepsis related.   - Continue Klonopin 1 mg BID  - Continue Seroquel  - Continue Propofol - Continue Fentanyl  # Acute Hypotension Likely medication induced although at risk for developing septic shock.   - Continue peripheral Levophed PRN to maintain MAP > 65 - Will place CVC today   # Severe Thrombocytopenia # Normocytic Anemia  #  History of IDA - Potentially secondary to severe illness + sepsis. DIC panel negative x 3.  - S/p 3 units of pheresed platelets - Platelets are up today but suspect 2/2 hemoconcentration  - CBC daily  - Hold DVT ppx and Aspirin   # Liver Masses  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 PSA minimally elevated so low suspicion for that being primary. AFP negative. Discussed pursuing IR biopsy in the future with family but given critical illness, patient is not currently stable enough.  # Hepatitis C infection  Suspect this is chronic given last IVDA occurred over 30 years ago. Viral load elevated at 27, 200,000.   - Genotype pending  - Not stable enough for treatment at this time - Will counsel family to be tested as well   # Urinary Retention - Continue bladder scans q8h - In/out cath for volume > 300 cc - Place foley if more than 3 in/out required  # Demand Ischemia  # Elevated BNP  - No prior known cardiac history nor HTN - EKG with findings consistent with LVH but no acute ischemic changes  - TTE with preserved EF  # Electrolyte Abnormalities: Hypophosphatemia, Hypokalemia, Hypernatremia - Monitor daily and replete PRN - Increased free water today  Best Practice (right click and "Reselect all SmartList Selections" daily)   Diet/type: NPO.  Tube Feeds DVT prophylaxis: SCD GI prophylaxis: PPI Lines: A-line and central line. Still required.  Foley:  N/A Code Status:  full code Last date of multidisciplinary goals of care discussion [Wife and daughter updated at bedside on 2/27]  Labs   CBC: Recent Labs  Lab 09/21/21 1902 09/21/21 1943 09/23/21 2249 09/24/21 0254 09/25/21 0243 09/26/21 0039 09/27/21 0121 09/27/21 0225 09/27/21 1338 09/27/21 1554 09/27/21 1840 09/28/21 0321 09/28/21 0405  WBC 2.5*   < > 8.8 10.4 15.7* 18.9*  17.5*  --   --   --   --  20.7*  --   NEUTROABS 2.4  --  8.5*  --   --  18.3* 17.3*  --   --   --   --  19.4*  --   HGB 13.9   < > 11.5* 10.2* 10.1* 9.4* 9.9*   < > 9.2* 8.5* 8.5* 8.1* 8.2*  HCT 38.1*   < > 33.1* 29.7* 28.5* 26.8* 28.3*   < > 27.0* 25.0* 25.0* 23.6* 24.0*  MCV 95.7   < > 99.1 99.7 98.6 97.8 98.6  --   --   --   --  100.4*  --   PLT 76*   < > 10* 56* 20* 22* 20*  --   --   --   --  35*  --    < > = values in this interval not displayed.     Basic Metabolic Panel: Recent Labs  Lab 09/22/21 1959 09/23/21 [id number] 09/23/21 1556 09/24/21 0254 09/25/21 0243 09/26/21 0039 09/27/21 0121 09/27/21 0225 09/27/21 1338 09/27/21  1554 09/27/21 1840 09/28/21 0321 09/28/21 0405  NA  --  134*  --  139 138 142 145   < > 148* 148* 148* 148* 150*  K  --  3.2*  --  3.3* 3.8 4.3 4.4   < > 4.7 4.4 4.6 5.2* 5.2*  CL  --  101  --  105 107 109 115*  --   --   --   --  115*  --   CO2  --  25  --  26 25 24 24   --   --   --   --  25  --   GLUCOSE  --  130*  --  101* 100* 120* 170*  --   --   --   --  126*  --   BUN  --  20  --  21 27* 36* 45*  --   --   --   --  64*  --   CREATININE  --  0.79  --  0.65 0.62 0.76 0.75  --   --   --   --  1.18  --   CALCIUM  --  8.3*  --  8.3* 8.2* 7.9* 7.9*  --   --   --   --  7.4*  --   MG 1.7 2.2 2.1 2.1 2.3  --   --   --   --   --   --   --   --   PHOS 1.4* 2.2* 2.4*  --  2.3* 3.2  --   --   --   --   --   --   --    < > = values in this interval not displayed.    GFR: Estimated Creatinine Clearance: 47.8 mL/min (by C-G formula based on SCr of 1.18 mg/dL). Recent Labs  Lab 09/22/21 0926 09/23/21 0640 09/23/21 0843 09/23/21 1556 09/24/21 0700 09/25/21 0243 09/26/21 0039 09/27/21 0121 09/28/21 0321  WBC  --    < >  --    < >  --  15.7* 18.9* 17.5* 20.7*  LATICACIDVEN 4.2*  --  3.1*  --  2.2* 1.6  --   --   --    < > = values in this interval not displayed.     Liver Function Tests: Recent Labs  Lab 09/24/21 0254 09/25/21 0243  09/26/21 0039 09/27/21 0121 09/28/21 0321  AST 198* 277* 268* 194* 197*  ALT 47* 79* 89* 89* 101*  ALKPHOS 87 165* 221* 215* 268*  BILITOT 1.3* 1.7* 2.0* 2.1* 2.4*  PROT 5.0* 4.6* 4.5* 5.0* 4.9*  ALBUMIN 1.5* <1.5* <1.5* <1.5* <1.5*    No results for input(s): LIPASE, AMYLASE in the last 168 hours. No results for input(s): AMMONIA in the last 168 hours.  ABG    Component Value Date/Time   PHART 7.197 (LL) 09/28/2021 0405   PCO2ART 71.7 (HH) 09/28/2021 0405   PO2ART 97 09/28/2021 0405   HCO3 27.0 09/28/2021 0405   TCO2 29 09/28/2021 0405   ACIDBASEDEF 1.0 09/28/2021 0405   O2SAT 93 09/28/2021 0405      Coagulation Profile: Recent Labs  Lab 09/21/21 1902 09/21/21 2207 09/23/21 0843 09/24/21 0254 09/26/21 0039  INR 1.0 1.1 1.1 1.1 1.1     Cardiac Enzymes: Recent Labs  Lab 09/22/21 1959 09/23/21 0843  CKTOTAL 588* 508*     HbA1C: Hgb A1c MFr Bld  Date/Time Value Ref Range Status  09/27/2021 01:54 AM 5.0 4.8 -  5.6 % Final    Comment:    (NOTE) Pre diabetes:          5.7%-6.4%  Diabetes:              >6.4%  Glycemic control for   <7.0% adults with diabetes     CBG: Recent Labs  Lab 09/27/21 1504 09/27/21 1949 09/27/21 2318 09/28/21 0338 09/28/21 0729  GLUCAP 137* 130* 131* 130* 155*    Review of Systems:   Negative except as noted above.    Signed,  Dr. Verdene Lennert Internal Medicine PGY-3  09/28/2021, 10:12 AM

## 2021-09-28 NOTE — Progress Notes (Addendum)
Vent changes were made per MD based off of 1505 ABG results. RN requested for a follow up ABG, Respiratory therapy and Elink MD discussed request. No follow up ABG was ordered at this time.

## 2021-09-28 NOTE — Progress Notes (Signed)
RT NOTE: patient did not meet SBT criteria this AM due FIO2 and PEEP requirements.  Tolerating current ventilator settings well.  Will continue to monitor.

## 2021-09-28 NOTE — Procedures (Signed)
Bronchoscopy Procedure Note  Demon Volante  [id number]  08/15/1955  Date:09/28/21  Time:12:11 PM   Provider Performing:Nelta Caudill   Procedure(s):  Flexible Bronchoscopy [phone]) and Initial Therapeutic Aspiration of Tracheobronchial Tree [phone])  Indication(s) Acute respiratory failure and septic shock  Consent Risks of the procedure as well as the alternatives and risks of each were explained to the patient and/or caregiver.  Consent for the procedure was obtained and is signed in the bedside chart  Anesthesia Propofol and fentanyl   Time Out Verified patient identification, verified procedure, site/side was marked, verified correct patient position, special equipment/implants available, medications/allergies/relevant history reviewed, required imaging and test results available.   Sterile Technique Usual hand hygiene, masks, gowns, and gloves were used   Procedure Description Bronchoscope advanced through endotracheal tube and into airway.  Airways were examined down to subsegmental level with findings noted below.   Following diagnostic evaluation, BAL(s) performed in RML/LLL with normal saline and return of Yellowish fluid and Therapeutic aspiration performed in All bronchial tree mostly RLL/LLL  Findings: Thick mucus secretions noted throughout bronchial    Complications/Tolerance None; patient tolerated the procedure well. Chest X-ray is needed post procedure.   EBL Minimal   Specimen(s) Sent to the lab

## 2021-09-28 NOTE — Consult Note (Addendum)
[name]       Reason for Consult: pneumonia    Referring Physician: Dr. Tacy Learn  Principal Problem:   Community acquired pneumonia of left lower lobe of lung Active Problems:   Acute respiratory failure with hypoxia (HCC)   Sepsis ([place])   Acute respiratory failure (HCC)   Protein-calorie malnutrition, severe    chlorhexidine gluconate (MEDLINE KIT)  15 mL Mouth Rinse BID   Chlorhexidine Gluconate Cloth  6 each Topical Daily   clonazePAM  1 mg Per Tube BID   feeding supplement (PROSource TF)  45 mL Per Tube Daily   folic acid  1 mg Per Tube Daily   free water  200 mL Per Tube Q4H   insulin aspart  0-9 Units Subcutaneous Q4H   mouth rinse  15 mL Mouth Rinse 10 times per day   multivitamin with minerals  1 tablet Per Tube Daily   nicotine  14 mg Transdermal Daily   pantoprazole sodium  40 mg Per Tube Daily   QUEtiapine  50 mg Per Tube BID   thiamine  100 mg Per Tube Daily    Recommendations: Will change antibiotics to Penicillin IV alone  Assessment: He has severe pneumonia with respiratory failure with positive cultures with Strep pneumonia.  No evidence at this time of a new nosocomial infection so will continue to target the Strep pneumonia with penicillin based on sensitivities.  I suspect he just has a severe pneumonia that is not improving despite appropriate treatment.  Will continue to monitor the cultures sent today.   Also with cirrhosis noted and positive hepatitis C RNA.  Can consider treatment as an outpatient.   Antibiotics: Vancomycin, ceftriaxone, cefepime  HPI: Troy Sampson is a 66 y.o. male with a history of tobacco abuse, nephrolithiasis who came in with shortness of breath and 2/21 and found to have pneumonia with fever, hypoxia, leukopenia and large infiltrate of the left lower lung base.  He was started on antibiotics and blood cultures grew Streptoccocus pneumoniae in 3/3 bottles.  He was intubated and has remained  intubated with worsening respiratory status.  Initial WBC of 2.5 and peaked at 20.7.  Platelets initially 76 and most recently 68.  Underwent bronchoscopy today and thick mucous secretions noted.     Review of Systems:  Unable to be assessed due to patient factors  History reviewed. No pertinent past medical history. History unobtainable from the patient  Social History   Tobacco Use   Smoking status: Every Day    Types: Cigarettes  Substance Use Topics   Alcohol use: Yes   Drug use: Never    History reviewed. No pertinent family history.  No Known Allergies  Physical Exam: Constitutional: intubated  Vitals:   09/28/21 1200 09/28/21 1221  BP: (!) 105/58   Pulse: 89   Resp: (!) 28   Temp:  98.1 F (36.7 C)  SpO2: 99% 99%   EYES: anicteric; pinpoint pupils Cardiovascular: tachy rr Respiratory: respiratory effort on vent Musculoskeletal: no edema   Lab Results  Component Value Date   WBC 20.7 (H) 09/28/2021   HGB 7.1 (L) 09/28/2021   HCT 21.0 (L) 09/28/2021   MCV 100.4 (H) 09/28/2021   PLT 35 (L) 09/28/2021    Lab Results  Component Value Date   CREATININE 1.18 09/28/2021   BUN 64 (H) 09/28/2021   NA 150 (H) 09/28/2021   K 5.0 09/28/2021   CL 115 (H) 09/28/2021   CO2 25  09/28/2021    Lab Results  Component Value Date   ALT 101 (H) 09/28/2021   AST 197 (H) 09/28/2021   ALKPHOS 268 (H) 09/28/2021     Microbiology: Recent Results (from the past 240 hour(s))  Blood Culture (routine x 2)     Status: Abnormal   Collection Time: 09/21/21  7:02 PM   Specimen: Right Antecubital; Blood  Result Value Ref Range Status   Specimen Description   Final    RIGHT ANTECUBITAL Performed at [facility], [place] [address] [place], [place] [address]    Special Requests   Final    BOTTLES DRAWN AEROBIC AND ANAEROBIC Blood Culture adequate volume Performed at [facility], [address] [place], [place] [address]    Culture  Setup Time   Final     GRAM POSITIVE COCCI IN BOTH AEROBIC AND ANAEROBIC BOTTLES CRITICAL RESULT CALLED TO, READ BACK BY AND VERIFIED WITH: E. Worthing, AT 1118 09/22/21 D. VANHOOK BY D.VANHOOK Performed at [facility], [place] [address], [place], [place] [address]    Culture STREPTOCOCCUS PNEUMONIAE (A)  Final   Report Status 09/24/2021 FINAL  Final   Organism ID, Bacteria STREPTOCOCCUS PNEUMONIAE  Final      Susceptibility   Streptococcus pneumoniae - MIC*    ERYTHROMYCIN >=8 RESISTANT Resistant     LEVOFLOXACIN 0.5 SENSITIVE Sensitive     VANCOMYCIN <=0.12 SENSITIVE Sensitive     PENICILLIN (meningitis) <=0.06 SENSITIVE Sensitive     PENO - penicillin <=0.06      PENICILLIN (non-meningitis) <=0.06 SENSITIVE Sensitive     PENICILLIN (oral) <=0.06 SENSITIVE Sensitive     CEFTRIAXONE (non-meningitis) <=0.12 SENSITIVE Sensitive     CEFTRIAXONE (meningitis) <=0.12 SENSITIVE Sensitive     * STREPTOCOCCUS PNEUMONIAE  Blood Culture ID Panel (Reflexed)     Status: Abnormal   Collection Time: 09/21/21  7:02 PM  Result Value Ref Range Status   Enterococcus faecalis NOT DETECTED NOT DETECTED Final   Enterococcus Faecium NOT DETECTED NOT DETECTED Final   Listeria monocytogenes NOT DETECTED NOT DETECTED Final   Staphylococcus species NOT DETECTED NOT DETECTED Final   Staphylococcus aureus (BCID) NOT DETECTED NOT DETECTED Final   Staphylococcus epidermidis NOT DETECTED NOT DETECTED Final   Staphylococcus lugdunensis NOT DETECTED NOT DETECTED Final   Streptococcus species DETECTED (A) NOT DETECTED Final    Comment: CRITICAL RESULT CALLED TO, READ BACK BY AND VERIFIED WITHFerne Coe PHARMD, AT 1118 09/22/21 D. VANHOOK BY D.VANHOOK    Streptococcus agalactiae NOT DETECTED NOT DETECTED Final   Streptococcus pneumoniae DETECTED (A) NOT DETECTED Final    Comment: CRITICAL RESULT CALLED TO, READ BACK BY AND VERIFIED WITH: Ferne Coe PHARMD, AT 1118 09/22/21 D. VANHOOK BY D.VANHOOK    Streptococcus pyogenes  NOT DETECTED NOT DETECTED Final   A.calcoaceticus-baumannii NOT DETECTED NOT DETECTED Final   Bacteroides fragilis NOT DETECTED NOT DETECTED Final   Enterobacterales NOT DETECTED NOT DETECTED Final   Enterobacter cloacae complex NOT DETECTED NOT DETECTED Final   Escherichia coli NOT DETECTED NOT DETECTED Final   Klebsiella aerogenes NOT DETECTED NOT DETECTED Final   Klebsiella oxytoca NOT DETECTED NOT DETECTED Final   Klebsiella pneumoniae NOT DETECTED NOT DETECTED Final   Proteus species NOT DETECTED NOT DETECTED Final   Salmonella species NOT DETECTED NOT DETECTED Final   Serratia marcescens NOT DETECTED NOT DETECTED Final   Haemophilus influenzae NOT DETECTED NOT DETECTED Final   Neisseria meningitidis NOT DETECTED  NOT DETECTED Final   Pseudomonas aeruginosa NOT DETECTED NOT DETECTED Final   Stenotrophomonas maltophilia NOT DETECTED NOT DETECTED Final   Candida albicans NOT DETECTED NOT DETECTED Final   Candida auris NOT DETECTED NOT DETECTED Final   Candida glabrata NOT DETECTED NOT DETECTED Final   Candida krusei NOT DETECTED NOT DETECTED Final   Candida parapsilosis NOT DETECTED NOT DETECTED Final   Candida tropicalis NOT DETECTED NOT DETECTED Final   Cryptococcus neoformans/gattii NOT DETECTED NOT DETECTED Final    Comment: Performed at [facility], [place] [address] [place], [place] [address]  Blood Culture (routine x 2)     Status: Abnormal   Collection Time: 09/21/21  7:15 PM   Specimen: BLOOD LEFT HAND  Result Value Ref Range Status   Specimen Description BLOOD LEFT HAND  Final   Special Requests   Final    BOTTLES DRAWN AEROBIC ONLY Blood Culture results may not be optimal due to an inadequate volume of blood received in culture bottles   Culture  Setup Time   Final    GRAM POSITIVE COCCI IN CHAINS AEROBIC BOTTLE ONLY CRITICAL VALUE NOTED.  VALUE IS CONSISTENT WITH PREVIOUSLY REPORTED AND CALLED VALUE.    Culture (A)  Final    STREPTOCOCCUS  PNEUMONIAE SUSCEPTIBILITIES PERFORMED ON PREVIOUS CULTURE WITHIN THE LAST 5 DAYS.    Report Status 09/24/2021 FINAL  Final  Resp Panel by RT-PCR (Flu A&B, Covid) Nasopharyngeal Swab     Status: None   Collection Time: 09/21/21  7:18 PM   Specimen: Nasopharyngeal Swab; Nasopharyngeal(NP) swabs in vial transport medium  Result Value Ref Range Status   SARS Coronavirus 2 by RT PCR NEGATIVE NEGATIVE Final    Comment: (NOTE) SARS-CoV-2 target nucleic acids are NOT DETECTED.  The SARS-CoV-2 RNA is generally detectable in upper respiratory specimens during the acute phase of infection. The lowest concentration of SARS-CoV-2 viral copies this assay can detect is 138 copies/mL. A negative result does not preclude SARS-Cov-2 infection and should not be used as the sole basis for treatment or other patient management decisions. A negative result may occur with  improper specimen collection/handling, submission of specimen other than nasopharyngeal swab, presence of viral mutation(s) within the areas targeted by this assay, and inadequate number of viral copies(<138 copies/mL). A negative result must be combined with clinical observations, patient history, and epidemiological information. The expected result is Negative.  Fact Sheet for Patients:  EntrepreneurPulse.com.au  Fact Sheet for Healthcare Providers:  IncredibleEmployment.be  This test is no t yet approved or cleared by the [place] FDA and  has been authorized for detection and/or diagnosis of SARS-CoV-2 by FDA under an Emergency Use Authorization (EUA). This EUA will remain  in effect (meaning this test can be used) for the duration of the COVID-19 declaration under Section 564(b)(1) of the Act, 21 U.S.C.section 360bbb-3(b)(1), unless the authorization is terminated  or revoked sooner.       Influenza A by PCR NEGATIVE NEGATIVE Final   Influenza B by PCR NEGATIVE NEGATIVE Final     Comment: (NOTE) The Xpert Xpress SARS-CoV-2/FLU/RSV plus assay is intended as an aid in the diagnosis of influenza from Nasopharyngeal swab specimens and should not be used as a sole basis for treatment. Nasal washings and aspirates are unacceptable for Xpert Xpress SARS-CoV-2/FLU/RSV testing.  Fact Sheet for Patients: EntrepreneurPulse.com.au  Fact Sheet for Healthcare Providers: IncredibleEmployment.be  This test is not yet approved or cleared by the [place] and has been authorized  for detection and/or diagnosis of SARS-CoV-2 by FDA under an Emergency Use Authorization (EUA). This EUA will remain in effect (meaning this test can be used) for the duration of the COVID-19 declaration under Section 564(b)(1) of the Act, 21 U.S.C. section 360bbb-3(b)(1), unless the authorization is terminated or revoked.  Performed at [facility], [address] [place], [place] [address]   MRSA Next Gen by PCR, Nasal     Status: None   Collection Time: 09/22/21  2:10 AM  Result Value Ref Range Status   MRSA by PCR Next Gen NOT DETECTED NOT DETECTED Final    Comment: (NOTE) The GeneXpert MRSA Assay (FDA approved for NASAL specimens only), is one component of a comprehensive MRSA colonization surveillance program. It is not intended to diagnose MRSA infection nor to guide or monitor treatment for MRSA infections. Test performance is not FDA approved in patients less than 56 years old. Performed at [facility], [place] [address], [place], [place] [address]   Culture, blood (routine x 2)     Status: None (Preliminary result)   Collection Time: 09/25/21  9:33 AM   Specimen: BLOOD  Result Value Ref Range Status   Specimen Description BLOOD SITE NOT SPECIFIED  Final   Special Requests   Final    BOTTLES DRAWN AEROBIC ONLY Blood Culture results may not be optimal due to an inadequate volume of blood received in culture bottles   Culture    Final    NO GROWTH 3 DAYS Performed at [facility], [place] [address] [place], [place] [address]    Report Status PENDING  Incomplete  Culture, blood (routine x 2)     Status: None (Preliminary result)   Collection Time: 09/25/21  9:33 AM   Specimen: BLOOD  Result Value Ref Range Status   Specimen Description BLOOD SITE NOT SPECIFIED  Final   Special Requests   Final    BOTTLES DRAWN AEROBIC ONLY Blood Culture results may not be optimal due to an inadequate volume of blood received in culture bottles   Culture   Final    NO GROWTH 3 DAYS Performed at [facility], [place] [address] [place],  [address]    Report Status PENDING  Incomplete  Culture, Respiratory w Gram Stain     Status: None   Collection Time: 09/26/21 10:30 AM   Specimen: Tracheal Aspirate; Respiratory  Result Value Ref Range Status   Specimen Description TRACHEAL ASPIRATE  Final   Special Requests NONE  Final   Gram Stain   Final    FEW WBC PRESENT,BOTH PMN AND MONONUCLEAR RARE GRAM POSITIVE COCCOBACILLUS    Culture   Final    RARE Normal respiratory flora-no Staph aureus or Pseudomonas seen Performed at [facility], [address] [place],  [address]    Report Status 09/28/2021 FINAL  Final    Thayer Headings, [facility] [email or web address] 09/28/2021, 3:13 PM

## 2021-09-29 ENCOUNTER — Other Ambulatory Visit (HOSPITAL_COMMUNITY): Payer: No Typology Code available for payment source

## 2021-09-29 ENCOUNTER — Inpatient Hospital Stay (HOSPITAL_COMMUNITY): Payer: Medicare Other

## 2021-09-29 LAB — COMPREHENSIVE METABOLIC PANEL
ALT: 94 U/L — ABNORMAL HIGH (ref 0–44)
AST: 166 U/L — ABNORMAL HIGH (ref 15–41)
Albumin: <1.5 g/dL — ABNORMAL LOW (ref 3.5–5.0)
Alkaline Phosphatase: 235 U/L — ABNORMAL HIGH (ref 38–126)
Anion gap: 6 (ref 5–15)
BUN: 61 mg/dL — ABNORMAL HIGH (ref 8–23)
CO2: 25 mmol/L (ref 22–32)
Calcium: 7.5 mg/dL — ABNORMAL LOW (ref 8.9–10.3)
Chloride: 117 mmol/L — ABNORMAL HIGH (ref 98–111)
Creatinine, Ser: 1.07 mg/dL (ref 0.61–1.24)
GFR, Estimated: >60 mL/min (ref >60)
Glucose, Bld: 168 mg/dL — ABNORMAL HIGH (ref 70–99)
Potassium: 5 mmol/L (ref 3.5–5.1)
Sodium: 148 mmol/L — ABNORMAL HIGH (ref 135–145)
Total Bilirubin: 2.7 mg/dL — ABNORMAL HIGH (ref 0.3–1.2)
Total Protein: 5 g/dL — ABNORMAL LOW (ref 6.5–8.1)

## 2021-09-29 LAB — POCT I-STAT 7, (LYTES, BLD GAS, ICA,H+H)
Acid-Base Excess: 0 mmol/L (ref 0.0–2.0)
Bicarbonate: 25.8 mmol/L (ref 20.0–28.0)
Calcium, Ion: 1.16 mmol/L (ref 1.15–1.40)
HCT: 18 % — ABNORMAL LOW (ref 39.0–52.0)
Hemoglobin: 6.1 g/dL — CL (ref 13.0–17.0)
O2 Saturation: 93 %
Patient temperature: 99.7
Potassium: 5.2 mmol/L — ABNORMAL HIGH (ref 3.5–5.1)
Sodium: 150 mmol/L — ABNORMAL HIGH (ref 135–145)
TCO2: 27 mmol/L (ref 22–32)
pCO2 arterial: 50.6 mmHg — ABNORMAL HIGH (ref 32–48)
pH, Arterial: 7.318 — ABNORMAL LOW (ref 7.35–7.45)
pO2, Arterial: 76 mmHg — ABNORMAL LOW (ref 83–108)

## 2021-09-29 LAB — CBC WITH DIFFERENTIAL/PLATELET
Abs Immature Granulocytes: 0 10*3/uL (ref 0.00–0.07)
Basophils Absolute: 0.2 10*3/uL — ABNORMAL HIGH (ref 0.0–0.1)
Basophils Relative: 1 %
Eosinophils Absolute: 0 10*3/uL (ref 0.0–0.5)
Eosinophils Relative: 0 %
HCT: 20.3 % — ABNORMAL LOW (ref 39.0–52.0)
Hemoglobin: 7.1 g/dL — ABNORMAL LOW (ref 13.0–17.0)
Lymphocytes Relative: 1 %
Lymphs Abs: 0.2 10*3/uL — ABNORMAL LOW (ref 0.7–4.0)
MCH: 34.3 pg — ABNORMAL HIGH (ref 26.0–34.0)
MCHC: 35 g/dL (ref 30.0–36.0)
MCV: 98.1 fL (ref 80.0–100.0)
Monocytes Absolute: 0.2 10*3/uL (ref 0.1–1.0)
Monocytes Relative: 1 %
Neutro Abs: 20.6 10*3/uL — ABNORMAL HIGH (ref 1.7–7.7)
Neutrophils Relative %: 97 %
Platelets: 44 10*3/uL — ABNORMAL LOW (ref 150–400)
RBC: 2.07 MIL/uL — ABNORMAL LOW (ref 4.22–5.81)
RDW: 16.3 % — ABNORMAL HIGH (ref 11.5–15.5)
Smear Review: DECREASED
WBC Morphology: INCREASED
WBC: 21.2 10*3/uL — ABNORMAL HIGH (ref 4.0–10.5)
nRBC: 0.2 % (ref 0.0–0.2)

## 2021-09-29 LAB — HEMOGLOBIN AND HEMATOCRIT, BLOOD
HCT: 21.4 % — ABNORMAL LOW (ref 39.0–52.0)
Hemoglobin: 7.6 g/dL — ABNORMAL LOW (ref 13.0–17.0)

## 2021-09-29 LAB — GLUCOSE, CAPILLARY
Glucose-Capillary: 111 mg/dL — ABNORMAL HIGH (ref 70–99)
Glucose-Capillary: 118 mg/dL — ABNORMAL HIGH (ref 70–99)
Glucose-Capillary: 121 mg/dL — ABNORMAL HIGH (ref 70–99)
Glucose-Capillary: 136 mg/dL — ABNORMAL HIGH (ref 70–99)
Glucose-Capillary: 140 mg/dL — ABNORMAL HIGH (ref 70–99)
Glucose-Capillary: 149 mg/dL — ABNORMAL HIGH (ref 70–99)

## 2021-09-29 LAB — PREPARE RBC (CROSSMATCH)

## 2021-09-29 MED ORDER — HEPARIN SODIUM (PORCINE) 5000 UNIT/ML IJ SOLN: 5000.0000 [IU] | Freq: Three times a day (TID) | INTRAMUSCULAR | Status: DC

## 2021-09-29 MED ORDER — SODIUM CHLORIDE 0.9% IV SOLUTION: Freq: Once | INTRAVENOUS | Status: AC

## 2021-09-29 MED ORDER — HEPARIN SODIUM (PORCINE) 5000 UNIT/ML IJ SOLN
5000.0000 [IU] | Freq: Two times a day (BID) | INTRAMUSCULAR | Status: DC
  Administered 2021-09-29 – 2021-10-02 (×7): 5000 [IU] via SUBCUTANEOUS
  Filled 2021-09-29 (×7): qty 1

## 2021-09-29 MED ORDER — OXYCODONE HCL 5 MG PO TABS
5.0000 mg | ORAL_TABLET | Freq: Four times a day (QID) | ORAL | Status: DC
  Administered 2021-09-29 – 2021-10-08 (×25): 5 mg
  Filled 2021-09-29 (×27): qty 1

## 2021-09-29 MED ORDER — VECURONIUM BROMIDE 10 MG IV SOLR
10.0000 mg | Freq: Once | INTRAVENOUS | Status: AC
  Administered 2021-09-29: 10 mg via INTRAVENOUS
  Filled 2021-09-29: qty 10

## 2021-09-29 NOTE — Progress Notes (Signed)
RT obtained ABG and reported critical Hb 6.1 to MD and RN. RT will continue to monitor.  ?

## 2021-09-29 NOTE — Progress Notes (Signed)
NAME:  Troy Sampson, MRN:  [id number], DOB:  Jan 03, 1956, LOS: 7 ADMISSION DATE:  09/21/2021, CONSULTATION DATE:  09/22/2021 REFERRING MD: Dr. Tamera Punt CHIEF COMPLAINT:  SOB   History of Present Illness:  [name] is a 66 y/o male with a PMHx of duodenal ulcer, IDA, nephrolithiasis who presented to the ED with SOB. On arrival, he was obtained 2/2 severe hypoxia and subsequently intubated emergently.   Per family and chart review, patient is a smoker but no know history of COPD. No past medical history of alcohol abuse.   Pertinent  Medical History  Duodenal ulcer with IDA  Significant Hospital Events: Including procedures, antibiotic start and stop dates in addition to other pertinent events   2/22: Admitted overnight to ICU. Blood cultures 3/4 positive for strep pneumo. Strep pneumo antigen positive  2/26: New onset fevers. Antibiotics broadened to Cefepime and Vancomycin. Propofol switched to Precedex.  2/27: Bradycardia noted on Precedex with increased tachypnea.   Interim History / Subjective:   Antibiotics were switched to penicillin G per ID recommendations Patient remained afebrile Vasopressor requirement is coming down Remains on full vent support  Objective   Blood pressure 115/67, pulse (!) 121, temperature 99.7 F (37.6 C), temperature source Oral, resp. rate (!) 35, height _0  (1.702 m), weight 57.3 kg, SpO2 96 %.    Vent Mode: PRVC FiO2 (%):  [60 %-80 %] 60 % Set Rate:  [35 bmp] 35 bmp Vt Set:  [500 mL] 500 mL PEEP:  [8 cmH20] 8 cmH20 Plateau Pressure:  [24 cmH20-29 cmH20] 29 cmH20   Intake/Output Summary (Last 24 hours) at 09/29/2021 0939 Last data filed at 09/29/2021 0900 Gross per 24 hour  Intake 3874.2 ml  Output 2630 ml  Net 1244.2 ml   Filed Weights   09/26/21 0321 09/27/21 0414 09/29/21 0500  Weight: 55 kg 54.2 kg 57.3 kg   Examination:    Physical exam: General: Crtitically ill-appearing cachectic male, orally intubated HEENT: [place]/AT, eyes  anicteric.  ETT and OGT in place Neuro: Sedated, not following commands.  Eyes are closed.  Pupils 3 mm bilateral reactive to light Chest: Coarse breath sounds, bilateral basal crackles, no wheezes or rhonchi Heart: Regular rate and rhythm, no murmurs or gallops Abdomen: Soft, nontender, nondistended, bowel sounds present Skin: No rash    Resolved Hospital Problem list   #Lactic Acidosis   Assessment & Plan:  Severe Sepsis with septic shock 2/2 Strep Pneumo CAP + Bacteremia  CT chest with evidence of significant multi-focal pneumonia with blood cultures positive for strep pneumo TTE without evidence of endocarditis Blood cultures remain negative TEE is ordered Appreciate infectious disease input, antibiotics were switched to penicillin G White count remains elevated Bronch with BAL was done yesterday, cultures are pending Remains on vasopressors though requirement is coming down Currently on Levophed at 2 mics  Acute Hypoxic and Hypercapnic Respiratory Failure 2/2 Community Acquired Pneumonia Probable COPD Patient remains on full vent support Peak and plateau pressures are at goal Currently on 60% FiO2 down from 100% yesterday Remains on PEEP of 8 Continue PRN Vecuronium  VAP bundle  Continue Duonebs PRN  Acute septic encephalopathy Patient is still requiring propofol and fentanyl Continue Klonopin 1 mg BID  Continue Seroquel  Started on oxycodone per tube  Severe Thrombocytopenia Anemia of critical illness Patient hemoglobin dropped to 6.1 We will transfuse 1 unit PRBCs Platelet count remained stable now S/p 3 units of pheresed platelets Start subcu heparin for DVT prophylaxis  Liver Masses  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 PSA minimally elevated so low suspicion for that being primary. AFP negative. Discussed pursuing IR  biopsy in the future with family but given critical illness, patient is not currently stable enough.  Hepatitis C infection Suspect this is chronic given last IVDA occurred over 30 years ago. Viral load elevated at 27, 200,000.  Genotype pending  Not stable enough for treatment at this time Will counsel family to be tested as well   Acute Urinary Retention Continue bladder scans q8h In/out cath for volume > 300 cc  Type 2 MI due to Demand Ischemia due to severe sepsis EKG with findings consistent with LVH but no acute ischemic changes  TTE with preserved EF  Hypophosphatemia, Hypokalemia, Hypernatremia Continue aggressive electrolyte supplement  Severe protein calorie malnutrition Continue dietary supplements  Best Practice (right click and "Reselect all SmartList Selections" daily)   Diet/type: NPO.  Tube Feeds DVT prophylaxis: SCD GI prophylaxis: PPI Lines: A-line and central line. Still required.  Foley:  N/A Code Status:  full code Last date of multidisciplinary goals of care discussion [Wife and daughter updated at bedside on 2/28, decision was to continue full scope of care, explained that overall his prognosis is guarded]  Labs   CBC: Recent Labs  Lab 09/23/21 2249 09/24/21 0254 09/25/21 0243 09/26/21 0039 09/27/21 0121 09/27/21 0225 09/28/21 0321 09/28/21 0405 09/28/21 1505 09/29/21 0350 09/29/21 0902  WBC 8.8   < > 15.7* 18.9* 17.5*  --  20.7*  --   --  21.2*  --   NEUTROABS 8.5*  --   --  18.3* 17.3*  --  19.4*  --   --  20.6*  --   HGB 11.5*   < > 10.1* 9.4* 9.9*   < > 8.1* 8.2* 7.1* 7.1* 6.1*  HCT 33.1*   < > 28.5* 26.8* 28.3*   < > 23.6* 24.0* 21.0* 20.3* 18.0*  MCV 99.1   < > 98.6 97.8 98.6  --  100.4*  --   --  98.1  --   PLT 10*   < > 20* 22* 20*  --  35*  --   --  44*  --    < > = values in this interval not displayed.    Basic Metabolic Panel: Recent Labs  Lab 09/22/21 1250 09/22/21 1959 09/23/21 0640 09/23/21 1556 09/24/21 0254  09/25/21 0243 09/26/21 0039 09/27/21 0121 09/27/21 0225 09/28/21 0321 09/28/21 0405 09/28/21 1505 09/29/21 0350 09/29/21 0902  NA  --   --  134*  --  139 138 142 145   < > 148* 150* 150* 148* 150*  K  --   --  3.2*  --  3.3* 3.8 4.3 4.4   < > 5.2* 5.2* 5.0 5.0 5.2*  CL   < >  --  101  --  105 107 109 115*  --  115*  --   --  117*  --   CO2   < >  --  25  --  _0 --  25  --   --  25  --   GLUCOSE   < >  --  130*  --  101* 100* 120* 170*  --  126*  --   --  168*  --   BUN   < >  --  20  --  21 27* 36* 45*  --  64*  --   --  61*  --   CREATININE   < >  --  0.79  --  0.65 0.62 0.76 0.75  --  1.18  --   --  1.07  --   CALCIUM   < >  --  8.3*  --  8.3* 8.2* 7.9* 7.9*  --  7.4*  --   --  7.5*  --   MG  --  1.7 2.2 2.1 2.1 2.3  --   --   --   --   --   --   --   --   PHOS  --  1.4* 2.2* 2.4*  --  2.3* 3.2  --   --   --   --   --   --   --    < > = values in this interval not displayed.   GFR: Estimated Creatinine Clearance: 55.8 mL/min (by C-G formula based on SCr of 1.07 mg/dL). Recent Labs  Lab 09/23/21 0843 09/23/21 1556 09/24/21 0700 09/25/21 0243 09/26/21 0039 09/27/21 0121 09/28/21 0321 09/29/21 0350  WBC  --    < >  --  15.7* 18.9* 17.5* 20.7* 21.2*  LATICACIDVEN 3.1*  --  2.2* 1.6  --   --   --   --    < > = values in this interval not displayed.    Liver Function Tests: Recent Labs  Lab 09/25/21 0243 09/26/21 0039 09/27/21 0121 09/28/21 0321 09/29/21 0350  AST 277* 268* 194* 197* 166*  ALT 79* 89* 89* 101* 94*  ALKPHOS 165* 221* 215* 268* 235*  BILITOT 1.7* 2.0* 2.1* 2.4* 2.7*  PROT 4.6* 4.5* 5.0* 4.9* 5.0*  ALBUMIN <1.5* <1.5* <1.5* <1.5* <1.5*   No results for input(s): LIPASE, AMYLASE in the last 168 hours. No results for input(s): AMMONIA in the last 168 hours.  ABG    Component Value Date/Time   PHART 7.318 (L) 09/29/2021 0902   PCO2ART 50.6 (H) 09/29/2021 0902   PO2ART 76 (L) 09/29/2021 0902   HCO3 25.8 09/29/2021 0902   TCO2 27  09/29/2021 0902   ACIDBASEDEF 1.0 09/28/2021 0405   O2SAT 93 09/29/2021 0902      Coagulation Profile: Recent Labs  Lab 09/23/21 0843 09/24/21 0254 09/26/21 0039  INR 1.1 1.1 1.1    Cardiac Enzymes: Recent Labs  Lab 09/22/21 1959 09/23/21 0843  CKTOTAL 588* 508*    HbA1C: Hgb A1c MFr Bld  Date/Time Value Ref Range Status  09/27/2021 01:54 AM 5.0 4.8 - 5.6 % Final    Comment:    (NOTE) Pre diabetes:          5.7%-6.4%  Diabetes:              >6.4%  Glycemic control for   <7.0% adults with diabetes     CBG: Recent Labs  Lab 09/28/21 1509 09/28/21 1947 09/28/21 2339 09/29/21 0326 09/29/21 0827  GLUCAP 174* 145* 151* 149* 136*   Total critical care time: 38 minutes  Performed by: [place] care time was exclusive of separately billable procedures and treating other patients.   Critical care was necessary to treat or prevent imminent or life-threatening deterioration.   Critical care was time spent personally by me on the following activities: development of treatment plan with patient and/or surrogate as well as nursing, discussions with consultants, evaluation of patient's response to treatment, examination of patient, obtaining history  from patient or surrogate, ordering and performing treatments and interventions, ordering and review of laboratory studies, ordering and review of radiographic studies, pulse oximetry and re-evaluation of patient's condition.   Jacky Kindle MD [facility] See Amion for pager If no response to pager, please call [phone] until 7pm After 7pm, Please call E-link [phone]

## 2021-09-29 NOTE — Progress Notes (Signed)
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 Elink called re: ABG pH 7.19/Pco2 71/Po2 97/Bicarb 27  ?  ?Plans: 2/27 ?- repeat one time dose of Vecuronium to better sync with vent  ?- seems to have autoPEEP by virtue of waveform on ventilator ?- d/w RN- asked to allow exhalation of trapped gas with brief vent disconnect ?- if plateau pressures are ok, < 30, can increase TV slightly to 420 - 450 if tolerated. Alternatively can change to presssure control vent mode  ? ?Addendum: 2/28-3/1 ?- similar issues again today- seen on E-Link- RR 35, but does not appear to have much autoPEEP now  ?- nurse requesting another dose Vecuronium, will get another ABG before we do this. Seems relatively synchronous now  ?

## 2021-09-29 NOTE — Progress Notes (Signed)
[place] for Infectious Disease   Reason for visit: Follow up on pneumonia  Interval History: remains intubated, no response.  Afebrile > 24 hours.  WBC stable at 21.2.    Day 9 total antibiotics Day 2 penicillin  Physical Exam: Constitutional:  Vitals:   09/29/21 1002 09/29/21 1045  BP: 124/74   Pulse: (!) 126 (!) 126  Resp: (!) 35 (!) 32  Temp:    SpO2: 95% 94%  No response HENT: +ET Respiratory: respiratory effort on vent, some asynchrony Cardiovascular: tachy RR   Review of Systems: Unable to be assessed due to patient factors  Lab Results  Component Value Date   WBC 21.2 (H) 09/29/2021   HGB 6.1 (LL) 09/29/2021   HCT 18.0 (L) 09/29/2021   MCV 98.1 09/29/2021   PLT 44 (L) 09/29/2021    Lab Results  Component Value Date   CREATININE 1.07 09/29/2021   BUN 61 (H) 09/29/2021   NA 150 (H) 09/29/2021   K 5.2 (H) 09/29/2021   CL 117 (H) 09/29/2021   CO2 25 09/29/2021    Lab Results  Component Value Date   ALT 94 (H) 09/29/2021   AST 166 (H) 09/29/2021   ALKPHOS 235 (H) 09/29/2021     Microbiology: Recent Results (from the past 240 hour(s))  Blood Culture (routine x 2)     Status: Abnormal   Collection Time: 09/21/21  7:02 PM   Specimen: Right Antecubital; Blood  Result Value Ref Range Status   Specimen Description   Final    RIGHT ANTECUBITAL Performed at [facility], [address] [place], [place] [address]    Special Requests   Final    BOTTLES DRAWN AEROBIC AND ANAEROBIC Blood Culture adequate volume Performed at [facility], [address] [place], [place] [address]    Culture  Setup Time   Final    GRAM POSITIVE COCCI IN BOTH AEROBIC AND ANAEROBIC BOTTLES CRITICAL RESULT CALLED TO, READ BACK BY AND VERIFIED WITH: E. [place], AT 1118 09/22/21 D. VANHOOK BY D.VANHOOK Performed at [facility], [place] [address], [place], [place] [address]    Culture STREPTOCOCCUS PNEUMONIAE (A)  Final   Report Status  09/24/2021 FINAL  Final   Organism ID, Bacteria STREPTOCOCCUS PNEUMONIAE  Final      Susceptibility   Streptococcus pneumoniae - MIC*    ERYTHROMYCIN >=8 RESISTANT Resistant     LEVOFLOXACIN 0.5 SENSITIVE Sensitive     VANCOMYCIN <=0.12 SENSITIVE Sensitive     PENICILLIN (meningitis) <=0.06 SENSITIVE Sensitive     PENO - penicillin <=0.06      PENICILLIN (non-meningitis) <=0.06 SENSITIVE Sensitive     PENICILLIN (oral) <=0.06 SENSITIVE Sensitive     CEFTRIAXONE (non-meningitis) <=0.12 SENSITIVE Sensitive     CEFTRIAXONE (meningitis) <=0.12 SENSITIVE Sensitive     * STREPTOCOCCUS PNEUMONIAE  Blood Culture ID Panel (Reflexed)     Status: Abnormal   Collection Time: 09/21/21  7:02 PM  Result Value Ref Range Status   Enterococcus faecalis NOT DETECTED NOT DETECTED Final   Enterococcus Faecium NOT DETECTED NOT DETECTED Final   Listeria monocytogenes NOT DETECTED NOT DETECTED Final   Staphylococcus species NOT DETECTED NOT DETECTED Final   Staphylococcus aureus (BCID) NOT DETECTED NOT DETECTED Final   Staphylococcus epidermidis NOT DETECTED NOT DETECTED Final   Staphylococcus lugdunensis NOT DETECTED NOT DETECTED Final   Streptococcus species DETECTED (A) NOT DETECTED Final    Comment: CRITICAL RESULT  CALLED TO, READ BACK BY AND VERIFIED WITH: [place], AT 1118 09/22/21 D. VANHOOK BY D.VANHOOK    Streptococcus agalactiae NOT DETECTED NOT DETECTED Final   Streptococcus pneumoniae DETECTED (A) NOT DETECTED Final    Comment: CRITICAL RESULT CALLED TO, READ BACK BY AND VERIFIED WITH: Ferne Coe PHARMD, AT 1118 09/22/21 D. VANHOOK BY D.VANHOOK    Streptococcus pyogenes NOT DETECTED NOT DETECTED Final   A.calcoaceticus-baumannii NOT DETECTED NOT DETECTED Final   Bacteroides fragilis NOT DETECTED NOT DETECTED Final   Enterobacterales NOT DETECTED NOT DETECTED Final   Enterobacter cloacae complex NOT DETECTED NOT DETECTED Final   Escherichia coli NOT DETECTED NOT DETECTED Final    Klebsiella aerogenes NOT DETECTED NOT DETECTED Final   Klebsiella oxytoca NOT DETECTED NOT DETECTED Final   Klebsiella pneumoniae NOT DETECTED NOT DETECTED Final   Proteus species NOT DETECTED NOT DETECTED Final   Salmonella species NOT DETECTED NOT DETECTED Final   Serratia marcescens NOT DETECTED NOT DETECTED Final   Haemophilus influenzae NOT DETECTED NOT DETECTED Final   Neisseria meningitidis NOT DETECTED NOT DETECTED Final   Pseudomonas aeruginosa NOT DETECTED NOT DETECTED Final   Stenotrophomonas maltophilia NOT DETECTED NOT DETECTED Final   Candida albicans NOT DETECTED NOT DETECTED Final   Candida auris NOT DETECTED NOT DETECTED Final   Candida glabrata NOT DETECTED NOT DETECTED Final   Candida krusei NOT DETECTED NOT DETECTED Final   Candida parapsilosis NOT DETECTED NOT DETECTED Final   Candida tropicalis NOT DETECTED NOT DETECTED Final   Cryptococcus neoformans/gattii NOT DETECTED NOT DETECTED Final    Comment: Performed at [facility], [address], [place], [place] [address]  Blood Culture (routine x 2)     Status: Abnormal   Collection Time: 09/21/21  7:15 PM   Specimen: BLOOD LEFT HAND  Result Value Ref Range Status   Specimen Description BLOOD LEFT HAND  Final   Special Requests   Final    BOTTLES DRAWN AEROBIC ONLY Blood Culture results may not be optimal due to an inadequate volume of blood received in culture bottles   Culture  Setup Time   Final    GRAM POSITIVE COCCI IN CHAINS AEROBIC BOTTLE ONLY CRITICAL VALUE NOTED.  VALUE IS CONSISTENT WITH PREVIOUSLY REPORTED AND CALLED VALUE.    Culture (A)  Final    STREPTOCOCCUS PNEUMONIAE SUSCEPTIBILITIES PERFORMED ON PREVIOUS CULTURE WITHIN THE LAST 5 DAYS.    Report Status 09/24/2021 FINAL  Final  Resp Panel by RT-PCR (Flu A&B, Covid) Nasopharyngeal Swab     Status: None   Collection Time: 09/21/21  7:18 PM   Specimen: Nasopharyngeal Swab; Nasopharyngeal(NP) swabs in vial transport medium  Result Value  Ref Range Status   SARS Coronavirus 2 by RT PCR NEGATIVE NEGATIVE Final    Comment: (NOTE) SARS-CoV-2 target nucleic acids are NOT DETECTED.  The SARS-CoV-2 RNA is generally detectable in upper respiratory specimens during the acute phase of infection. The lowest concentration of SARS-CoV-2 viral copies this assay can detect is 138 copies/mL. A negative result does not preclude SARS-Cov-2 infection and should not be used as the sole basis for treatment or other patient management decisions. A negative result may occur with  improper specimen collection/handling, submission of specimen other than nasopharyngeal swab, presence of viral mutation(s) within the areas targeted by this assay, and inadequate number of viral copies(<138 copies/mL). A negative result must be combined with clinical observations, patient history, and epidemiological information. The expected result is Negative.  Fact Sheet  for Patients:  EntrepreneurPulse.com.au  Fact Sheet for Healthcare Providers:  IncredibleEmployment.be  This test is no t yet approved or cleared by the [place] FDA and  has been authorized for detection and/or diagnosis of SARS-CoV-2 by FDA under an Emergency Use Authorization (EUA). This EUA will remain  in effect (meaning this test can be used) for the duration of the COVID-19 declaration under Section 564(b)(1) of the Act, 21 U.S.C.section 360bbb-3(b)(1), unless the authorization is terminated  or revoked sooner.       Influenza A by PCR NEGATIVE NEGATIVE Final   Influenza B by PCR NEGATIVE NEGATIVE Final    Comment: (NOTE) The Xpert Xpress SARS-CoV-2/FLU/RSV plus assay is intended as an aid in the diagnosis of influenza from Nasopharyngeal swab specimens and should not be used as a sole basis for treatment. Nasal washings and aspirates are unacceptable for Xpert Xpress SARS-CoV-2/FLU/RSV testing.  Fact Sheet for  Patients: EntrepreneurPulse.com.au  Fact Sheet for Healthcare Providers: IncredibleEmployment.be  This test is not yet approved or cleared by the [place] FDA and has been authorized for detection and/or diagnosis of SARS-CoV-2 by FDA under an Emergency Use Authorization (EUA). This EUA will remain in effect (meaning this test can be used) for the duration of the COVID-19 declaration under Section 564(b)(1) of the Act, 21 U.S.C. section 360bbb-3(b)(1), unless the authorization is terminated or revoked.  Performed at [facility], [address] [place], [place] [address]   MRSA Next Gen by PCR, Nasal     Status: None   Collection Time: 09/22/21  2:10 AM  Result Value Ref Range Status   MRSA by PCR Next Gen NOT DETECTED NOT DETECTED Final    Comment: (NOTE) The GeneXpert MRSA Assay (FDA approved for NASAL specimens only), is one component of a comprehensive MRSA colonization surveillance program. It is not intended to diagnose MRSA infection nor to guide or monitor treatment for MRSA infections. Test performance is not FDA approved in patients less than 12 years old. Performed at [facility], [place] [address] [place], [place] [address]   Culture, blood (routine x 2)     Status: None (Preliminary result)   Collection Time: 09/25/21  9:33 AM   Specimen: BLOOD  Result Value Ref Range Status   Specimen Description BLOOD SITE NOT SPECIFIED  Final   Special Requests   Final    BOTTLES DRAWN AEROBIC ONLY Blood Culture results may not be optimal due to an inadequate volume of blood received in culture bottles   Culture   Final    NO GROWTH 3 DAYS Performed at [facility], [place] [address] [place], [place] [address]    Report Status PENDING  Incomplete  Culture, blood (routine x 2)     Status: None (Preliminary result)   Collection Time: 09/25/21  9:33 AM   Specimen: BLOOD  Result Value Ref Range Status   Specimen  Description BLOOD SITE NOT SPECIFIED  Final   Special Requests   Final    BOTTLES DRAWN AEROBIC ONLY Blood Culture results may not be optimal due to an inadequate volume of blood received in culture bottles   Culture   Final    NO GROWTH 3 DAYS Performed at [facility], [place] [address] [place], [place] [address]    Report Status PENDING  Incomplete  Culture, Respiratory w Gram Stain     Status: None   Collection Time: 09/26/21 10:30 AM   Specimen: Tracheal Aspirate;  Respiratory  Result Value Ref Range Status   Specimen Description TRACHEAL ASPIRATE  Final   Special Requests NONE  Final   Gram Stain   Final    FEW WBC PRESENT,BOTH PMN AND MONONUCLEAR RARE GRAM POSITIVE COCCOBACILLUS    Culture   Final    RARE Normal respiratory flora-no Staph aureus or Pseudomonas seen Performed at [facility], [address], [place], [place] [address]    Report Status 09/28/2021 FINAL  Final    Impression/Plan:  1. Pneumococcal pneumonia - he is on penicillin and remains intubated.  BAL sample from yesterday with no growth reported to date.  Repeat blood cultures remain ngtd.   Will continue with penicillin for coverage of the pneumonococcus with no changes.   Will continue to monitor.    2.  Respiratory failure - remains inbutated and is from #1.   Continuing supportive care per CCM.    3.  Shock - remains on pressor support with levophed.  Will continue to monitor.

## 2021-09-29 NOTE — TOC Progression Note (Signed)
Transition of Care (TOC) - Progression Note  ? ? ?Patient Details  ?Name: Ramone Gander ?MRN: [id number] ?Date of Birth: [birth year] ? ?Transition of Care (TOC) CM/SW Contact  ?Tom-Johnson, Hershal Coria, RN ?Phone Number: ?09/29/2021, 3:11 PM ? ?Clinical Narrative:    ? ?Patient continues on Ventilation. TOC will continue to follow with needs. ? ?Expected Discharge Plan: Long Term Nursing Home ?  ? ?Expected Discharge Plan and Services ?Expected Discharge Plan: Long Term Nursing Home ?  ?  ?  ?  ?                ?  ?  ?  ?  ?  ?  ?  ?  ?  ?  ? ? ?Social Determinants of Health (SDOH) Interventions ?  ? ?Readmission Risk Interventions ?No flowsheet data found. ? ?

## 2021-09-30 ENCOUNTER — Inpatient Hospital Stay (HOSPITAL_COMMUNITY): Payer: Medicare Other

## 2021-09-30 DIAGNOSIS — B1920 Unspecified viral hepatitis C without hepatic coma: Secondary | ICD-10-CM | POA: Diagnosis present

## 2021-09-30 DIAGNOSIS — B953 Streptococcus pneumoniae as the cause of diseases classified elsewhere: Secondary | ICD-10-CM | POA: Diagnosis present

## 2021-09-30 DIAGNOSIS — D696 Thrombocytopenia, unspecified: Secondary | ICD-10-CM | POA: Diagnosis present

## 2021-09-30 DIAGNOSIS — G9341 Metabolic encephalopathy: Secondary | ICD-10-CM

## 2021-09-30 DIAGNOSIS — R7881 Bacteremia: Secondary | ICD-10-CM | POA: Diagnosis present

## 2021-09-30 DIAGNOSIS — R160 Hepatomegaly, not elsewhere classified: Secondary | ICD-10-CM | POA: Diagnosis present

## 2021-09-30 DIAGNOSIS — E878 Other disorders of electrolyte and fluid balance, not elsewhere classified: Secondary | ICD-10-CM | POA: Diagnosis present

## 2021-09-30 DIAGNOSIS — I214 Non-ST elevation (NSTEMI) myocardial infarction: Secondary | ICD-10-CM

## 2021-09-30 DIAGNOSIS — D649 Anemia, unspecified: Secondary | ICD-10-CM | POA: Diagnosis present

## 2021-09-30 DIAGNOSIS — L8990 Pressure ulcer of unspecified site, unspecified stage: Secondary | ICD-10-CM | POA: Diagnosis present

## 2021-09-30 DIAGNOSIS — J13 Pneumonia due to Streptococcus pneumoniae: Secondary | ICD-10-CM | POA: Diagnosis present

## 2021-09-30 LAB — COMPREHENSIVE METABOLIC PANEL
ALT: 85 U/L — ABNORMAL HIGH (ref 0–44)
AST: 140 U/L — ABNORMAL HIGH (ref 15–41)
Albumin: <1.5 g/dL — ABNORMAL LOW (ref 3.5–5.0)
Alkaline Phosphatase: 212 U/L — ABNORMAL HIGH (ref 38–126)
Anion gap: 5 (ref 5–15)
BUN: 63 mg/dL — ABNORMAL HIGH (ref 8–23)
CO2: 26 mmol/L (ref 22–32)
Calcium: 7.5 mg/dL — ABNORMAL LOW (ref 8.9–10.3)
Chloride: 117 mmol/L — ABNORMAL HIGH (ref 98–111)
Creatinine, Ser: 1.09 mg/dL (ref 0.61–1.24)
GFR, Estimated: >60 mL/min (ref >60)
Glucose, Bld: 148 mg/dL — ABNORMAL HIGH (ref 70–99)
Potassium: 5.7 mmol/L — ABNORMAL HIGH (ref 3.5–5.1)
Sodium: 148 mmol/L — ABNORMAL HIGH (ref 135–145)
Total Bilirubin: 2.7 mg/dL — ABNORMAL HIGH (ref 0.3–1.2)
Total Protein: 5 g/dL — ABNORMAL LOW (ref 6.5–8.1)

## 2021-09-30 LAB — CULTURE, BLOOD (ROUTINE X 2)
Culture: NO GROWTH
Culture: NO GROWTH

## 2021-09-30 LAB — POCT I-STAT 7, (LYTES, BLD GAS, ICA,H+H)
Acid-Base Excess: 1 mmol/L (ref 0.0–2.0)
Bicarbonate: 26.9 mmol/L (ref 20.0–28.0)
Calcium, Ion: 1.18 mmol/L (ref 1.15–1.40)
HCT: 22 % — ABNORMAL LOW (ref 39.0–52.0)
Hemoglobin: 7.5 g/dL — ABNORMAL LOW (ref 13.0–17.0)
O2 Saturation: 91 %
Patient temperature: 98.6
Potassium: 5.5 mmol/L — ABNORMAL HIGH (ref 3.5–5.1)
Sodium: 147 mmol/L — ABNORMAL HIGH (ref 135–145)
TCO2: 28 mmol/L (ref 22–32)
pCO2 arterial: 49.6 mmHg — ABNORMAL HIGH (ref 32–48)
pH, Arterial: 7.341 — ABNORMAL LOW (ref 7.35–7.45)
pO2, Arterial: 66 mmHg — ABNORMAL LOW (ref 83–108)

## 2021-09-30 LAB — CBC WITH DIFFERENTIAL/PLATELET
Abs Immature Granulocytes: 0.33 10*3/uL — ABNORMAL HIGH (ref 0.00–0.07)
Basophils Absolute: 0 10*3/uL (ref 0.0–0.1)
Basophils Relative: 0 %
Eosinophils Absolute: 0.1 10*3/uL (ref 0.0–0.5)
Eosinophils Relative: 0 %
HCT: 18.7 % — ABNORMAL LOW (ref 39.0–52.0)
Hemoglobin: 6.8 g/dL — CL (ref 13.0–17.0)
Immature Granulocytes: 2 %
Lymphocytes Relative: 3 %
Lymphs Abs: 0.6 10*3/uL — ABNORMAL LOW (ref 0.7–4.0)
MCH: 33.8 pg (ref 26.0–34.0)
MCHC: 36.4 g/dL — ABNORMAL HIGH (ref 30.0–36.0)
MCV: 93 fL (ref 80.0–100.0)
Monocytes Absolute: 0.3 10*3/uL (ref 0.1–1.0)
Monocytes Relative: 2 %
Neutro Abs: 17.7 10*3/uL — ABNORMAL HIGH (ref 1.7–7.7)
Neutrophils Relative %: 93 %
Platelets: 60 10*3/uL — ABNORMAL LOW (ref 150–400)
RBC: 2.01 MIL/uL — ABNORMAL LOW (ref 4.22–5.81)
RDW: 19.6 % — ABNORMAL HIGH (ref 11.5–15.5)
WBC: 19 10*3/uL — ABNORMAL HIGH (ref 4.0–10.5)
nRBC: 0.2 % (ref 0.0–0.2)

## 2021-09-30 LAB — BASIC METABOLIC PANEL
Anion gap: 7 (ref 5–15)
BUN: 62 mg/dL — ABNORMAL HIGH (ref 8–23)
CO2: 25 mmol/L (ref 22–32)
Calcium: 7.5 mg/dL — ABNORMAL LOW (ref 8.9–10.3)
Chloride: 115 mmol/L — ABNORMAL HIGH (ref 98–111)
Creatinine, Ser: 0.98 mg/dL (ref 0.61–1.24)
GFR, Estimated: >60 mL/min (ref >60)
Glucose, Bld: 212 mg/dL — ABNORMAL HIGH (ref 70–99)
Potassium: 5.5 mmol/L — ABNORMAL HIGH (ref 3.5–5.1)
Sodium: 147 mmol/L — ABNORMAL HIGH (ref 135–145)

## 2021-09-30 LAB — GLUCOSE, CAPILLARY
Glucose-Capillary: 137 mg/dL — ABNORMAL HIGH (ref 70–99)
Glucose-Capillary: 177 mg/dL — ABNORMAL HIGH (ref 70–99)
Glucose-Capillary: 197 mg/dL — ABNORMAL HIGH (ref 70–99)
Glucose-Capillary: 136 mg/dL — ABNORMAL HIGH (ref 70–99)
Glucose-Capillary: 164 mg/dL — ABNORMAL HIGH (ref 70–99)
Glucose-Capillary: 176 mg/dL — ABNORMAL HIGH (ref 70–99)

## 2021-09-30 LAB — HEMOGLOBIN AND HEMATOCRIT, BLOOD
HCT: 23.8 % — ABNORMAL LOW (ref 39.0–52.0)
Hemoglobin: 8 g/dL — ABNORMAL LOW (ref 13.0–17.0)

## 2021-09-30 LAB — PREPARE RBC (CROSSMATCH)

## 2021-09-30 LAB — HEPATITIS C GENOTYPE

## 2021-09-30 MED ORDER — SODIUM CHLORIDE 0.9% IV SOLUTION: Freq: Once | INTRAVENOUS | Status: AC

## 2021-09-30 MED ORDER — WHITE PETROLATUM EX OINT
TOPICAL_OINTMENT | CUTANEOUS | Status: DC | PRN
  Filled 2021-09-30: qty 28.35

## 2021-09-30 MED ORDER — PANTOPRAZOLE 2 MG/ML SUSPENSION
40.0000 mg | Freq: Two times a day (BID) | ORAL | Status: DC
  Administered 2021-09-30 (×2): 40 mg
  Filled 2021-09-30 (×2): qty 20

## 2021-09-30 MED ORDER — FUROSEMIDE 10 MG/ML IJ SOLN
40.0000 mg | Freq: Once | INTRAMUSCULAR | Status: AC
  Administered 2021-09-30: 40 mg via INTRAVENOUS
  Filled 2021-09-30: qty 4

## 2021-09-30 MED ORDER — DEXTROSE 5 % IV SOLN: INTRAVENOUS | Status: DC

## 2021-09-30 MED ORDER — SODIUM CHLORIDE 0.9% FLUSH: 10.0000 mL | INTRAVENOUS | Status: DC | PRN

## 2021-09-30 MED ORDER — QUETIAPINE FUMARATE 100 MG PO TABS
100.0000 mg | ORAL_TABLET | Freq: Two times a day (BID) | ORAL | Status: DC
  Administered 2021-09-30 – 2021-10-07 (×11): 100 mg
  Filled 2021-09-30 (×11): qty 1

## 2021-09-30 MED ORDER — SODIUM CHLORIDE 0.9% FLUSH
10.0000 mL | Freq: Two times a day (BID) | INTRAVENOUS | Status: DC
  Administered 2021-09-30 – 2021-10-03 (×8): 10 mL
  Administered 2021-10-04: 40 mL
  Administered 2021-10-04: 10 mL
  Administered 2021-10-05: 20 mL
  Administered 2021-10-05 – 2021-10-22 (×21): 10 mL

## 2021-09-30 NOTE — Progress Notes (Addendum)
? ?NAME:  Troy Sampson, MRN:  [id number], DOB:  March 11, 1956, LOS: 8 ?ADMISSION DATE:  09/21/2021, CONSULTATION DATE:  09/22/2021 ?REFERRING MD: Dr. Tamera Punt CHIEF COMPLAINT:  SOB  ? ?History of Present Illness:  ?[name] is a 66 y/o male with a PMHx of duodenal ulcer, IDA, nephrolithiasis who presented to the ED with SOB. On arrival, he was obtained 2/2 severe hypoxia and subsequently intubated emergently.  ? ?Per family and chart review, patient is a smoker but no know history of COPD. No past medical history of alcohol abuse.  ? ?Pertinent  Medical History  ?Duodenal ulcer with IDA ? ?Significant Hospital Events: ?Including procedures, antibiotic start and stop dates in addition to other pertinent events   ?2/22: Admitted overnight to ICU. Blood cultures 3/4 positive for strep pneumo. Strep pneumo antigen positive, TTE with EF 50 to 55% no wall motion abnormalities ?2/26: New onset fevers. Antibiotics broadened to Cefepime and Vancomycin. Propofol switched to Precedex.  ?2/27: Bradycardia noted on Precedex with increased tachypnea.  ?2/28, antibiotics changed to penicillin G per infectious disease recommendation ?3/1: Vasoactive drip requirements improving, received 1 unit of blood for hemoglobin of 6.1 ?3/2 follow-up hemoglobin 6.8, received second unit of blood, has also received 3 units of platelets since admission.  Off norepinephrine  ?Interim History / Subjective:  ?TEE is still pending hemodynamically stable but tachycardic still heavily sedated  ? ? ?Objective   ?Blood pressure (Abnormal) 126/56, pulse (Abnormal) 102, temperature 98.6 ?F (37 ?C), temperature source Oral, resp. rate (Abnormal) 35, height _0  (1.702 m), weight 57.4 kg, SpO2 97 %. ?   ?Vent Mode: PRVC ?FiO2 (%):  [50 %-60 %] 50 % ?Set Rate:  [35 bmp] 35 bmp ?Vt Set:  [500 mL] 500 mL ?PEEP:  [8 cmH20] 8 cmH20 ?Plateau Pressure:  [21 cmH20-30 cmH20] 21 cmH20  ? ?Intake/Output Summary (Last 24 hours) at 09/30/2021 0858 ?Last data filed at  09/30/2021 0800 ?Gross per 24 hour  ?Intake 4979.6 ml  ?Output 3115 ml  ?Net 1864.6 ml  ? ?Filed Weights  ? 09/27/21 0414 09/29/21 0500 09/30/21 0500  ?Weight: 54.2 kg 57.3 kg 57.4 kg  ? ?Examination:  ?General this is a chronically ill-appearing 66 year old male he still heavily sedated on propofol and fentanyl infusion hemodynamically more stable today ?HEENT normocephalic however does have some temporal wasting very poor appearing dentition orally intubated left IJ triple-lumen catheter is in place dressing clean dry and intact ?Pulmonary clear, diminished in bases, slightly tachypneic.  Current plateau pressure 24, currently 50% FiO2, PEEP 8 ?Portable chest x-ray reviewed from 2/28: Right greater than left airspace disease ?Cardiac: Tachycardic rhythm regular sinus on telemetry ?Abdomen soft, bowel sounds present, tube feed goals at rate, has Flexi-Seal in place with liquid stool ?Extremities warm dry palpable pulses brisk capillary refill ?Neuro currently heavily sedated ? ?Resolved Hospital Problem list   ?#Lactic Acidosis  ? ?Assessment & Plan:  ? ?Principal Problem: ?  Pneumonia, pneumococcal ([place]) ?Active Problems: ?  Pneumococcal bacteremia ?  Acute metabolic encephalopathy ?  Acute respiratory failure with hypoxia ([place]) ?  Sepsis ([place]) ?  Protein-calorie malnutrition, severe ?  Pressure injury of skin ?  Alteration in electrolyte and fluid balance ?  Hepatitis C ?  Liver mass ?  Anemia ?  NSTEMI (non-ST elevated myocardial infarction) ([place]) ?  Thrombocytopenia ([place]) ? ? ? ? ?Severe Sepsis with septic shock 2/2 Strep Pneumo CAP + Bacteremia  ?Appreciate infectious disease recommendations ?Follow-up culture still pending ?-Pressor requirements: Off ?Plan ?  Antibiotic day #10, day #3 penicillin, awaiting TEE and follow-up blood cultures to determine further recommendations on length of therapy ?Follow-up pending BAL ?Keep euvolemic ? ?Acute Hypoxic and Hypercapnic Respiratory Failure 2/2 Community Acquired  Pneumonia ?Probable COPD ?Plan ?Chest x-ray today ?Continue to wean PEEP/FiO2 ?Antibiotics as above following up pending cultures ?Scheduled DuoNebs ?VAP bundle ?PAD protocol, will make RASS goal -1 to -2 ?A.m. chest x-ray as well ?  ?Acute metabolic encephalopathy 2/2 sepsis  ?Plan ?Continuing propofol and fentanyl ?No change in current Klonopin twice daily 1 mg ?Increase Seroquel to 100 twice daily continue oxycodone per tube ?May consider transitioning propofol and fentanyl to Precedex and as needed fentanyl following TEE, would like to also see chest ?X-ray prior to change  ? ?Type 2 MI due to Demand Ischemia due to severe sepsis ?EKG with findings consistent with LVH but no acute ischemic changes  ?TTE with preserved EF ?Plan ?Outpatient follow-up ?As he stabilizes we will need to add aspirin, beta-blockade, and statin ? ?Fluid and electrolyte imbalance: Hypernatremia, hyperkalemia, hyperchloremia ?-He did get 1 dose of Lasix last night ?Currently over 14 L positive sodium slowly improving, current free water deficit: 1.8 L ?Plan ?Continue current free water replacement, 200 mL Q4 ?Follow-up repeat chemistry this morning, if potassium remains elevated will give Lokelma ?A.m. chemistry ?Discontinue any sodium chloride ? ?Severe Thrombocytopenia ?Anemia of critical illness ?Received total of 2 units of blood since 3/1, no clear evidence of bleeding ?Platelets stable ?Plan ?A.m. CBC ?Okay to continue subcutaneous heparin ?Trigger for transfusion remains for hemoglobin less than 7 ?Hemoccult stool ?PPI twice daily ? ?Acute Urinary Retention ?Plan ?Continue every 8 hour bladder scans ?In-N-Out catheters for volume greater than 300 ? ?Hyperglycemia ?Plan ?Ssi  ? 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 PSA minimally elevated so low suspicion  for that being primary. AFP negative. Discussed pursuing IR biopsy in the future with family but given critical illness, patient is not currently stable enough. ?Suspect this is chronic given last IVDA occurred over 30 years ago. Viral load elevated at 27, 200,000.  ?Plan ?F/u genotype ?Re-eval when more stable  ?Family to be counciled for screening  ? ? ?Severe protein calorie malnutrition ?Plan  ?tubefeeds ? ?Best Practice (right click and "Reselect all SmartList Selections" daily)  ? ?Diet/type: NPO.  Tube Feeds ?DVT prophylaxis: SCD ?GI prophylaxis: PPI ?Lines: A-line and central line. Still required.  ?Foley:  N/A ?Code Status:  full code ?Last date of multidisciplinary goals of care discussion [Wife and daughter updated at bedside on 2/28, decision was to continue full scope of care, explained that overall his prognosis is guarded] ? ?My cct 34 min  ? ?Troy Sampson ACNP-BC ?[place] ?Pager # [phone] OR # [phone] if no answer ? ?

## 2021-09-30 NOTE — Progress Notes (Addendum)
eLink Physician-Brief Progress Note ?Patient Name: Troy Sampson ?DOB: Jul 23, 1956 ?MRN: [id number] ? ? ?Date of Service ? 09/30/2021  ?HPI/Events of Note ? Hg 6.1 >Transfused 1U PRBC ?Follow-up CBC 7.6 however suspect this was high-normal expected response to transfusion ? ?Called by RN for Hg 6.8. No evidence of acute bleeding. ? ?K 5.7. CFB since admission +15L  ?eICU Interventions ? Transfuse 1U PRBC and f/u post-transfusion CBC ?Diurese lasix 40 mg once. Recheck K in 4 hours  ? ? ? ?Intervention Category ?Intermediate Interventions: Other: ? ?Shalen Petrak Mechele Collin ?09/30/2021, 4:14 AM ?

## 2021-09-30 NOTE — Progress Notes (Addendum)
eLink Physician-Brief Progress Note ?Patient Name: Cipriano Millikan ?DOB: January 06, 1956 ?MRN: [id number] ? ? ?Date of Service ? 09/30/2021  ?HPI/Events of Note ? Patient had emesis x 2 followed by increased WOB over RR 35 and SpO2 90-93% ? ?FIO2 50% PEEP 8   ?eICU Interventions ? Hold TF ?STAT CXR and KUB >> Unchanged bilateral infiltrates. ?PTX. Non obstructive gas pattern ? ?Routine CXR in am ordered ?Oxygenation improved to SpO2 95-96%. Suspect aspiration from emesis ?Ok to give flushes and meds. TF to be restarted in am by day team  ? ? ? ?Intervention Category ?Intermediate Interventions: Respiratory distress - evaluation and management ? ?Azarius Lambson Mechele Collin ?09/30/2021, 11:58 PM ?

## 2021-09-30 NOTE — Progress Notes (Signed)
[facility]   Reason for visit: Follow up on pneumonia  Interval History:  remains intubated, no response.  Plan for trach placement soon.   WBC trending back down now.  Tmax 102.4.    Day 10 total antibiotics Day 3 penicillin  Physical Exam: Constitutional:  Vitals:   09/30/21 0900 09/30/21 1127  BP: 112/80   Pulse: (!) 107   Resp: (!) 32   Temp:    SpO2: 94% 92%  No response.    HENT: +ET Respiratory: respiratory effort on vent Cardiovascular: RRR   Review of Systems: Unable to be assessed due to patient factors  Lab Results  Component Value Date   WBC 19.0 (H) 09/30/2021   HGB 7.5 (L) 09/30/2021   HCT 22.0 (L) 09/30/2021   MCV 93.0 09/30/2021   PLT 60 (L) 09/30/2021    Lab Results  Component Value Date   CREATININE 0.98 09/30/2021   BUN 62 (H) 09/30/2021   NA 147 (H) 09/30/2021   K 5.5 (H) 09/30/2021   CL 115 (H) 09/30/2021   CO2 25 09/30/2021    Lab Results  Component Value Date   ALT 85 (H) 09/30/2021   AST 140 (H) 09/30/2021   ALKPHOS 212 (H) 09/30/2021     Microbiology: Recent Results (from the past 240 hour(s))  Blood Culture (routine x 2)     Status: Abnormal   Collection Time: 09/21/21  7:02 PM   Specimen: Right Antecubital; Blood  Result Value Ref Range Status   Specimen Description   Final    RIGHT ANTECUBITAL Performed at [facility] [place], [address] [place], [place] [address]    Special Requests   Final    BOTTLES DRAWN AEROBIC AND ANAEROBIC Blood Culture adequate volume Performed at [facility] [place], [address] [place], [place] [address]    Culture  Setup Time   Final    GRAM POSITIVE COCCI IN BOTH AEROBIC AND ANAEROBIC BOTTLES CRITICAL RESULT CALLED TO, READ BACK BY AND VERIFIED WITH: Antoine Primas. MARTIN PHARMD, AT 1118 09/22/21 D. VANHOOK BY D.VANHOOK Performed at [facility], [address] [place], [place] [address]    Culture STREPTOCOCCUS PNEUMONIAE (A)  Final   Report  Status 09/24/2021 FINAL  Final   Organism ID, Bacteria STREPTOCOCCUS PNEUMONIAE  Final      Susceptibility   Streptococcus pneumoniae - MIC*    ERYTHROMYCIN >=8 RESISTANT Resistant     LEVOFLOXACIN 0.5 SENSITIVE Sensitive     VANCOMYCIN <=0.12 SENSITIVE Sensitive     PENICILLIN (meningitis) <=0.06 SENSITIVE Sensitive     PENO - penicillin <=0.06      PENICILLIN (non-meningitis) <=0.06 SENSITIVE Sensitive     PENICILLIN (oral) <=0.06 SENSITIVE Sensitive     CEFTRIAXONE (non-meningitis) <=0.12 SENSITIVE Sensitive     CEFTRIAXONE (meningitis) <=0.12 SENSITIVE Sensitive     * STREPTOCOCCUS PNEUMONIAE  Blood Culture ID Panel (Reflexed)     Status: Abnormal   Collection Time: 09/21/21  7:02 PM  Result Value Ref Range Status   Enterococcus faecalis NOT DETECTED NOT DETECTED Final   Enterococcus Faecium NOT DETECTED NOT DETECTED Final   Listeria monocytogenes NOT DETECTED NOT DETECTED Final   Staphylococcus species NOT DETECTED NOT DETECTED Final   Staphylococcus aureus (BCID) NOT DETECTED NOT DETECTED Final   Staphylococcus epidermidis NOT DETECTED NOT DETECTED Final   Staphylococcus lugdunensis NOT DETECTED NOT DETECTED Final   Streptococcus species DETECTED (A) NOT DETECTED Final  Comment: CRITICAL RESULT CALLED TO, READ BACK BY AND VERIFIED WITH: Antoine Primas PHARMD, AT 1118 09/22/21 D. VANHOOK BY D.VANHOOK    Streptococcus agalactiae NOT DETECTED NOT DETECTED Final   Streptococcus pneumoniae DETECTED (A) NOT DETECTED Final    Comment: CRITICAL RESULT CALLED TO, READ BACK BY AND VERIFIED WITH: Antoine Primas PHARMD, AT 1118 09/22/21 D. VANHOOK BY D.VANHOOK    Streptococcus pyogenes NOT DETECTED NOT DETECTED Final   A.calcoaceticus-baumannii NOT DETECTED NOT DETECTED Final   Bacteroides fragilis NOT DETECTED NOT DETECTED Final   Enterobacterales NOT DETECTED NOT DETECTED Final   Enterobacter cloacae complex NOT DETECTED NOT DETECTED Final   Escherichia coli NOT DETECTED NOT DETECTED Final    Klebsiella aerogenes NOT DETECTED NOT DETECTED Final   Klebsiella oxytoca NOT DETECTED NOT DETECTED Final   Klebsiella pneumoniae NOT DETECTED NOT DETECTED Final   Proteus species NOT DETECTED NOT DETECTED Final   Salmonella species NOT DETECTED NOT DETECTED Final   Serratia marcescens NOT DETECTED NOT DETECTED Final   Haemophilus influenzae NOT DETECTED NOT DETECTED Final   Neisseria meningitidis NOT DETECTED NOT DETECTED Final   Pseudomonas aeruginosa NOT DETECTED NOT DETECTED Final   Stenotrophomonas maltophilia NOT DETECTED NOT DETECTED Final   Candida albicans NOT DETECTED NOT DETECTED Final   Candida auris NOT DETECTED NOT DETECTED Final   Candida glabrata NOT DETECTED NOT DETECTED Final   Candida krusei NOT DETECTED NOT DETECTED Final   Candida parapsilosis NOT DETECTED NOT DETECTED Final   Candida tropicalis NOT DETECTED NOT DETECTED Final   Cryptococcus neoformans/gattii NOT DETECTED NOT DETECTED Final    Comment: Performed at [facility], [address] [place], [place] [address]  Blood Culture (routine x 2)     Status: Abnormal   Collection Time: 09/21/21  7:15 PM   Specimen: BLOOD LEFT HAND  Result Value Ref Range Status   Specimen Description BLOOD LEFT HAND  Final   Special Requests   Final    BOTTLES DRAWN AEROBIC ONLY Blood Culture results may not be optimal due to an inadequate volume of blood received in culture bottles   Culture  Setup Time   Final    GRAM POSITIVE COCCI IN CHAINS AEROBIC BOTTLE ONLY CRITICAL VALUE NOTED.  VALUE IS CONSISTENT WITH PREVIOUSLY REPORTED AND CALLED VALUE.    Culture (A)  Final    STREPTOCOCCUS PNEUMONIAE SUSCEPTIBILITIES PERFORMED ON PREVIOUS CULTURE WITHIN THE LAST 5 DAYS.    Report Status 09/24/2021 FINAL  Final  Resp Panel by RT-PCR (Flu A&B, Covid) Nasopharyngeal Swab     Status: None   Collection Time: 09/21/21  7:18 PM   Specimen: Nasopharyngeal Swab; Nasopharyngeal(NP) swabs in vial transport medium  Result Value  Ref Range Status   SARS Coronavirus 2 by RT PCR NEGATIVE NEGATIVE Final    Comment: (NOTE) SARS-CoV-2 target nucleic acids are NOT DETECTED.  The SARS-CoV-2 RNA is generally detectable in upper respiratory specimens during the acute phase of infection. The lowest concentration of SARS-CoV-2 viral copies this assay can detect is 138 copies/mL. A negative result does not preclude SARS-Cov-2 infection and should not be used as the sole basis for treatment or other patient management decisions. A negative result may occur with  improper specimen collection/handling, submission of specimen other than nasopharyngeal swab, presence of viral mutation(s) within the areas targeted by this assay, and inadequate number of viral copies(<138 copies/mL). A negative result must be combined with clinical observations, patient history, and epidemiological information. The expected result is Negative.  Fact Sheet for Patients:  BloggerCourse.com  Fact Sheet for Healthcare Providers:  SeriousBroker.it  This test is no t yet approved or cleared by the [place] FDA and  has been authorized for detection and/or diagnosis of SARS-CoV-2 by FDA under an Emergency Use Authorization (EUA). This EUA will remain  in effect (meaning this test can be used) for the duration of the COVID-19 declaration under Section 564(b)(1) of the Act, 21 U.S.C.section 360bbb-3(b)(1), unless the authorization is terminated  or revoked sooner.       Influenza A by PCR NEGATIVE NEGATIVE Final   Influenza B by PCR NEGATIVE NEGATIVE Final    Comment: (NOTE) The Xpert Xpress SARS-CoV-2/FLU/RSV plus assay is intended as an aid in the diagnosis of influenza from Nasopharyngeal swab specimens and should not be used as a sole basis for treatment. Nasal washings and aspirates are unacceptable for Xpert Xpress SARS-CoV-2/FLU/RSV testing.  Fact Sheet for  Patients: BloggerCourse.com  Fact Sheet for Healthcare Providers: SeriousBroker.it  This test is not yet approved or cleared by the [place] FDA and has been authorized for detection and/or diagnosis of SARS-CoV-2 by FDA under an Emergency Use Authorization (EUA). This EUA will remain in effect (meaning this test can be used) for the duration of the COVID-19 declaration under Section 564(b)(1) of the Act, 21 U.S.C. section 360bbb-3(b)(1), unless the authorization is terminated or revoked.  Performed at [facility], [address] [place], [place] [address]   MRSA Next Gen by PCR, Nasal     Status: None   Collection Time: 09/22/21  2:10 AM  Result Value Ref Range Status   MRSA by PCR Next Gen NOT DETECTED NOT DETECTED Final    Comment: (NOTE) The GeneXpert MRSA Assay (FDA approved for NASAL specimens only), is one component of a comprehensive MRSA colonization surveillance program. It is not intended to diagnose MRSA infection nor to guide or monitor treatment for MRSA infections. Test performance is not FDA approved in patients less than 82 years old. Performed at [facility], [address] [place], [place] [address]   Culture, blood (routine x 2)     Status: None   Collection Time: 09/25/21  9:33 AM   Specimen: BLOOD  Result Value Ref Range Status   Specimen Description BLOOD SITE NOT SPECIFIED  Final   Special Requests   Final    BOTTLES DRAWN AEROBIC ONLY Blood Culture results may not be optimal due to an inadequate volume of blood received in culture bottles   Culture   Final    NO GROWTH 5 DAYS Performed at [facility], [address] [place], [place] [address]    Report Status 09/30/2021 FINAL  Final  Culture, blood (routine x 2)     Status: None   Collection Time: 09/25/21  9:33 AM   Specimen: BLOOD  Result Value Ref Range Status   Specimen Description BLOOD SITE NOT SPECIFIED   Final   Special Requests   Final    BOTTLES DRAWN AEROBIC ONLY Blood Culture results may not be optimal due to an inadequate volume of blood received in culture bottles   Culture   Final    NO GROWTH 5 DAYS Performed at [facility], [address] [place], [place] [address]    Report Status 09/30/2021 FINAL  Final  Culture, Respiratory w Gram Stain     Status: None   Collection Time: 09/26/21 10:30 AM   Specimen: Tracheal Aspirate;  Respiratory  Result Value Ref Range Status   Specimen Description TRACHEAL ASPIRATE  Final   Special Requests NONE  Final   Gram Stain   Final    FEW WBC PRESENT,BOTH PMN AND MONONUCLEAR RARE GRAM POSITIVE COCCOBACILLUS    Culture   Final    RARE Normal respiratory flora-no Staph aureus or Pseudomonas seen Performed at [facility], [address], [place], [place] [address]    Report Status 09/28/2021 FINAL  Final    Impression/Plan:  1. Pneumococcal pneumonia - on penicillin and seems to be improving with no further pressor support needs.  High burden of disease and will continue with antibiotic treatment for a prolonged period, until improved clinically.    2.  Respiratory failure - remains intubated and in respiratory failure.  Will continue to monitor.   3.  Shock - now off of pressor support.   Improved overall with MAPs  > 65

## 2021-10-01 ENCOUNTER — Inpatient Hospital Stay (HOSPITAL_COMMUNITY): Payer: Medicare Other

## 2021-10-01 ENCOUNTER — Encounter (HOSPITAL_COMMUNITY)
Admission: EM | Disposition: A | Discharge status: Nursing Home (Includes ICF) | Payer: Self-pay | Source: Home / Self Care | Attending: Internal Medicine

## 2021-10-01 ENCOUNTER — Encounter (HOSPITAL_COMMUNITY): Payer: Self-pay | Admitting: Internal Medicine

## 2021-10-01 DIAGNOSIS — E43 Unspecified severe protein-calorie malnutrition: Secondary | ICD-10-CM | POA: Diagnosis present

## 2021-10-01 DIAGNOSIS — R609 Edema, unspecified: Secondary | ICD-10-CM

## 2021-10-01 HISTORY — PX: ESOPHAGOGASTRODUODENOSCOPY: SHX5428

## 2021-10-01 LAB — BASIC METABOLIC PANEL
Anion gap: 5 (ref 5–15)
BUN: 60 mg/dL — ABNORMAL HIGH (ref 8–23)
CO2: 28 mmol/L (ref 22–32)
Calcium: 7.5 mg/dL — ABNORMAL LOW (ref 8.9–10.3)
Chloride: 111 mmol/L (ref 98–111)
Creatinine, Ser: 0.85 mg/dL (ref 0.61–1.24)
GFR, Estimated: >60 mL/min (ref >60)
Glucose, Bld: 95 mg/dL (ref 70–99)
Potassium: 5.6 mmol/L — ABNORMAL HIGH (ref 3.5–5.1)
Sodium: 144 mmol/L (ref 135–145)

## 2021-10-01 LAB — CBC WITH DIFFERENTIAL/PLATELET
Abs Immature Granulocytes: 0.26 10*3/uL — ABNORMAL HIGH (ref 0.00–0.07)
Basophils Absolute: 0 10*3/uL (ref 0.0–0.1)
Basophils Relative: 0 %
Eosinophils Absolute: 0.1 10*3/uL (ref 0.0–0.5)
Eosinophils Relative: 0 %
HCT: 16.3 % — ABNORMAL LOW (ref 39.0–52.0)
Hemoglobin: 5.8 g/dL — CL (ref 13.0–17.0)
Immature Granulocytes: 1 %
Lymphocytes Relative: 4 %
Lymphs Abs: 0.7 10*3/uL (ref 0.7–4.0)
MCH: 32.8 pg (ref 26.0–34.0)
MCHC: 35.6 g/dL (ref 30.0–36.0)
MCV: 92.1 fL (ref 80.0–100.0)
Monocytes Absolute: 0.5 10*3/uL (ref 0.1–1.0)
Monocytes Relative: 3 %
Neutro Abs: 17.7 10*3/uL — ABNORMAL HIGH (ref 1.7–7.7)
Neutrophils Relative %: 92 %
Platelets: 68 10*3/uL — ABNORMAL LOW (ref 150–400)
RBC: 1.77 MIL/uL — ABNORMAL LOW (ref 4.22–5.81)
RDW: 17.6 % — ABNORMAL HIGH (ref 11.5–15.5)
WBC: 19.3 10*3/uL — ABNORMAL HIGH (ref 4.0–10.5)
nRBC: 0.5 % — ABNORMAL HIGH (ref 0.0–0.2)

## 2021-10-01 LAB — COMPREHENSIVE METABOLIC PANEL
ALT: 84 U/L — ABNORMAL HIGH (ref 0–44)
AST: 141 U/L — ABNORMAL HIGH (ref 15–41)
Albumin: <1.5 g/dL — ABNORMAL LOW (ref 3.5–5.0)
Alkaline Phosphatase: 186 U/L — ABNORMAL HIGH (ref 38–126)
Anion gap: 5 (ref 5–15)
BUN: 61 mg/dL — ABNORMAL HIGH (ref 8–23)
CO2: 26 mmol/L (ref 22–32)
Calcium: 7.3 mg/dL — ABNORMAL LOW (ref 8.9–10.3)
Chloride: 115 mmol/L — ABNORMAL HIGH (ref 98–111)
Creatinine, Ser: 0.98 mg/dL (ref 0.61–1.24)
GFR, Estimated: >60 mL/min (ref >60)
Glucose, Bld: 116 mg/dL — ABNORMAL HIGH (ref 70–99)
Potassium: 6.2 mmol/L — ABNORMAL HIGH (ref 3.5–5.1)
Sodium: 146 mmol/L — ABNORMAL HIGH (ref 135–145)
Total Bilirubin: 2.2 mg/dL — ABNORMAL HIGH (ref 0.3–1.2)
Total Protein: 4.8 g/dL — ABNORMAL LOW (ref 6.5–8.1)

## 2021-10-01 LAB — TROPONIN I (HIGH SENSITIVITY): Troponin I (High Sensitivity): 38 ng/L — ABNORMAL HIGH (ref <18)

## 2021-10-01 LAB — TRIGLYCERIDES: Triglycerides: 163 mg/dL — ABNORMAL HIGH (ref <150)

## 2021-10-01 LAB — GLUCOSE, CAPILLARY
Glucose-Capillary: 112 mg/dL — ABNORMAL HIGH (ref 70–99)
Glucose-Capillary: 115 mg/dL — ABNORMAL HIGH (ref 70–99)
Glucose-Capillary: 117 mg/dL — ABNORMAL HIGH (ref 70–99)
Glucose-Capillary: 81 mg/dL (ref 70–99)
Glucose-Capillary: 82 mg/dL (ref 70–99)
Glucose-Capillary: 90 mg/dL (ref 70–99)

## 2021-10-01 LAB — CBC
HCT: 21.7 % — ABNORMAL LOW (ref 39.0–52.0)
Hemoglobin: 7.5 g/dL — ABNORMAL LOW (ref 13.0–17.0)
MCH: 30.6 pg (ref 26.0–34.0)
MCHC: 34.6 g/dL (ref 30.0–36.0)
MCV: 88.6 fL (ref 80.0–100.0)
Platelets: 77 10*3/uL — ABNORMAL LOW (ref 150–400)
RBC: 2.45 MIL/uL — ABNORMAL LOW (ref 4.22–5.81)
RDW: 17.9 % — ABNORMAL HIGH (ref 11.5–15.5)
WBC: 19.1 10*3/uL — ABNORMAL HIGH (ref 4.0–10.5)
nRBC: 0.7 % — ABNORMAL HIGH (ref 0.0–0.2)

## 2021-10-01 LAB — PREPARE RBC (CROSSMATCH)

## 2021-10-01 LAB — HEMOGLOBIN AND HEMATOCRIT, BLOOD
HCT: 22.4 % — ABNORMAL LOW (ref 39.0–52.0)
Hemoglobin: 7.9 g/dL — ABNORMAL LOW (ref 13.0–17.0)

## 2021-10-01 LAB — OCCULT BLOOD X 1 CARD TO LAB, STOOL: Fecal Occult Bld: POSITIVE — AB

## 2021-10-01 SURGERY — EGD (ESOPHAGOGASTRODUODENOSCOPY)
Anesthesia: Moderate Sedation

## 2021-10-01 MED ORDER — FENTANYL CITRATE (PF) 100 MCG/2ML IJ SOLN
INTRAMUSCULAR | Status: AC
  Filled 2021-10-01: qty 4

## 2021-10-01 MED ORDER — EPINEPHRINE 1 MG/10ML IJ SOSY
PREFILLED_SYRINGE | INTRAMUSCULAR | Status: AC
  Filled 2021-10-01: qty 10

## 2021-10-01 MED ORDER — MIDAZOLAM HCL (PF) 5 MG/ML IJ SOLN
INTRAMUSCULAR | Status: AC
  Filled 2021-10-01: qty 2

## 2021-10-01 MED ORDER — FUROSEMIDE 10 MG/ML IJ SOLN
40.0000 mg | Freq: Two times a day (BID) | INTRAMUSCULAR | Status: AC
  Administered 2021-10-01 (×2): 40 mg via INTRAVENOUS
  Filled 2021-10-01 (×2): qty 4

## 2021-10-01 MED ORDER — FREE WATER: 350.0000 mL | Status: DC

## 2021-10-01 MED ORDER — PANTOPRAZOLE SODIUM 40 MG IV SOLR
40.0000 mg | Freq: Two times a day (BID) | INTRAVENOUS | Status: DC
Start: 2021-10-01 — End: 2021-10-06
  Administered 2021-10-01 – 2021-10-06 (×11): 40 mg via INTRAVENOUS
  Filled 2021-10-01 (×11): qty 10

## 2021-10-01 MED ORDER — LORAZEPAM 2 MG/ML IJ SOLN
1.0000 mg | Freq: Two times a day (BID) | INTRAMUSCULAR | Status: DC
  Administered 2021-10-01 (×2): 1 mg via INTRAVENOUS
  Filled 2021-10-01 (×2): qty 1

## 2021-10-01 MED ORDER — SUCRALFATE 1 GM/10ML PO SUSP
1.0000 g | Freq: Three times a day (TID) | ORAL | Status: DC
  Filled 2021-10-01 (×3): qty 10

## 2021-10-01 MED ORDER — THIAMINE HCL 100 MG/ML IJ SOLN
100.0000 mg | Freq: Every day | INTRAMUSCULAR | Status: DC
  Administered 2021-10-01 – 2021-10-06 (×6): 100 mg via INTRAVENOUS
  Filled 2021-10-01 (×6): qty 2

## 2021-10-01 MED ORDER — SODIUM CHLORIDE 0.9 % IV SOLN: INTRAVENOUS | Status: DC

## 2021-10-01 MED ORDER — SODIUM CHLORIDE 0.9% IV SOLUTION: Freq: Once | INTRAVENOUS | Status: AC

## 2021-10-01 NOTE — Op Note (Signed)
[facility] ?Patient Name: Troy Sampson ?Procedure Date : 10/01/2021 ?MRN: [id number] ?Attending MD: Jeani Hawking , MD ?Date of Birth: 1955/10/31 ?CSN: [id number] ?Age: 66 ?Admit Type: Inpatient ?Procedure:                Upper GI endoscopy ?Indications:              Iron deficiency anemia, Melena ?Providers:                Jeani Hawking, MD, Priscella Mann, Technician ?Referring MD:              ?Medicines:                Propofol per Anesthesia ?Complications:            No immediate complications. ?Estimated Blood Loss:     Estimated blood loss: none. ?Procedure:                Pre-Anesthesia Assessment: ?                          - Prior to the procedure, a History and Physical  ?                          was performed, and patient medications and  ?                          allergies were reviewed. The patient's tolerance of  ?                          previous anesthesia was also reviewed. The risks  ?                          and benefits of the procedure and the sedation  ?                          options and risks were discussed with the patient.  ?                          All questions were answered, and informed consent  ?                          was obtained. Prior Anticoagulants: The patient has  ?                          taken no previous anticoagulant or antiplatelet  ?                          agents. ASA Grade Assessment: IV - A patient with  ?                          severe systemic disease that is a constant threat  ?                          to life. After reviewing the risks and benefits,  ?  the patient was deemed in satisfactory condition to  ?                          undergo the procedure. ?                          - Sedation was administered by an anesthesia  ?                          professional. Deep sedation was attained. ?                          After obtaining informed consent, the endoscope was  ?                          passed under direct  vision. Throughout the  ?                          procedure, the patient's blood pressure, pulse, and  ?                          oxygen saturations were monitored continuously. The  ?                          [id number]-[id number] ([id number]) Olympus endoscope was introduced  ?                          through the mouth, and advanced to the second part  ?                          of duodenum. The upper GI endoscopy was  ?                          accomplished without difficulty. The patient  ?                          tolerated the procedure well. ?Scope In: ?Scope Out: ?Findings: ?     LA Grade D (one or more mucosal breaks involving at least 75% of  ?     esophageal circumference) esophagitis with bleeding was found in the  ?     lower third of the esophagus. ?     Patchy mildly erythematous mucosa with stigmata of recent bleeding was  ?     found in the gastric fundus and in the gastric body. This is consistent  ?     with suction trauma. ?     The examined duodenum was normal. ?     The esophagus exhibited friability. This is consistent with an LA Grade  ?     D esophagitis. In the gastric lumen there was evidence of suction trauma  ?     and friability with the gastric mucosa. ?Impression:               - LA Grade D reflux esophagitis with bleeding. ?                          -  Erythematous mucosa in the gastric fundus and  ?                          gastric body. ?                          - Normal examined duodenum. ?                          - No specimens collected. ?Recommendation:           - Return patient to hospital ward for ongoing care. ?                          - Maximize PPI. ?                          - Follow HGB and transfuse as necessary. ?                          - Signing off. ?Procedure Code(s):        --- Professional --- ?                          [phone], Esophagogastroduodenoscopy, flexible,  ?                          transoral; diagnostic, including collection of  ?                           specimen(s) by brushing or washing, when performed  ?                          (separate procedure) ?Diagnosis Code(s):        --- Professional --- ?                          K21.01, Gastro-esophageal reflux disease with  ?                          esophagitis, with bleeding ?                          K31.89, Other diseases of stomach and duodenum ?                          D50.9, Iron deficiency anemia, unspecified ?                          K92.1, Melena (includes Hematochezia) ?CPT copyright 2019 American Medical Association. All rights reserved. ?The codes documented in this report are preliminary and upon coder review may  ?be revised to meet current compliance requirements. ?Jeani Hawking, MD ?Jeani Hawking, MD ?10/01/2021 4:51:25 PM ?This report has been signed electronically. ?Number of Addenda: 0 ?

## 2021-10-01 NOTE — Progress Notes (Addendum)
? ?NAME:  Troy Sampson, MRN:  [id number], DOB:  October 15, 1955, LOS: 9 ?ADMISSION DATE:  09/21/2021, CONSULTATION DATE:  09/22/2021 ?REFERRING MD: Dr. Tamera Punt CHIEF COMPLAINT:  SOB  ? ?History of Present Illness:  ?[name] is a 66 y/o male with a PMHx of duodenal ulcer, IDA, nephrolithiasis who presented to the ED with SOB. On arrival, he was obtained 2/2 severe hypoxia and subsequently intubated emergently.  ? ?Per family and chart review, patient is a smoker but no know history of COPD. No past medical history of alcohol abuse.  ? ?Pertinent  Medical History  ?Duodenal ulcer with IDA ? ?Significant Hospital Events: ?Including procedures, antibiotic start and stop dates in addition to other pertinent events   ?2/22: Admitted overnight to ICU. Blood cultures 3/4 positive for strep pneumo. Strep pneumo antigen positive, TTE with EF 50 to 55% no wall motion abnormalities ?2/26: New onset fevers. Antibiotics broadened to Cefepime and Vancomycin. Propofol switched to Precedex.  ?2/27: Bradycardia noted on Precedex with increased tachypnea.  ?2/28, antibiotics changed to penicillin G per infectious disease recommendation ?3/1: Vasoactive drip requirements improving, received 1 unit of blood for hemoglobin of 6.1 ?3/2 follow-up hemoglobin 6.8, received second unit of blood, has also received 3 units of platelets since admission.  Off norepinephrine  ?Interim History / Subjective:  ? ?Had multiple episodes of emesis with possible aspiration.  Abdominal x-ray did not show any obstruction ?Transfused PRBC for low hemoglobin. ?Failing weaning trial ? ?Objective   ?Blood pressure 104/70, pulse 84, temperature 98.9 ?F (37.2 ?C), temperature source Oral, resp. rate (!) 35, height 5\' 7"  (1.702 m), weight 57.4 kg, SpO2 96 %. ?   ?Vent Mode: PRVC ?FiO2 (%):  [50 %] 50 % ?Set Rate:  [35 bmp] 35 bmp ?Vt Set:  [500 mL] 500 mL ?PEEP:  [8 cmH20] 8 cmH20 ?Plateau Pressure:  [22 cmH20-26 cmH20] 26 cmH20  ? ?Intake/Output Summary (Last 24  hours) at 10/01/2021 0820 ?Last data filed at 10/01/2021 0800 ?Gross per 24 hour  ?Intake 2669.67 ml  ?Output 1925 ml  ?Net 744.67 ml  ? ?Filed Weights  ? 09/27/21 0414 09/29/21 0500 09/30/21 0500  ?Weight: 54.2 kg 57.3 kg 57.4 kg  ? ?Examination:  ?Gen:      No acute distress, chronically ill-appearing ?HEENT:  EOMI, sclera anicteric, ETT ?Neck:     No masses; no thyromegaly ?Lungs:    Clear to auscultation bilaterally; normal respiratory effort ?CV:         Regular rate and rhythm; no murmurs ?Abd:      + bowel sounds; soft, non-tender; no palpable masses, no distension ?Ext:    No edema; adequate peripheral perfusion ?Skin:      Warm and dry; no rash ?Neuro: Sedated ? ?Labs/imaging reviewed ?Significant for room 146, potassium 6.2, BUN/creatinine 61/0.98 ?WBC count remains elevated at 19.3, hemoglobin 5.8, platelets 68 ?Chest x-ray with stable diffuse bilateral airspace disease.  No ileus on abdominal ? ?Resolved Hospital Problem list   ?#Lactic Acidosis  ? ?Assessment & Plan:  ? ?Principal Problem: ?  Pneumonia, pneumococcal ([place]) ?Active Problems: ?  Sepsis ([place]) ?  Pneumococcal bacteremia ?  Acute respiratory failure with hypoxia ([place]) ?  Pressure injury of skin ?  Acute metabolic encephalopathy ?  Alteration in electrolyte and fluid balance ?  Hepatitis C ?  Liver mass ?  Anemia ?  NSTEMI (non-ST elevated myocardial infarction) ([place]) ?  Thrombocytopenia ([place]) ?  Protein-calorie malnutrition, severe ? ?Severe Sepsis with septic shock 2/2  Strep Pneumo CAP + Bacteremia  ?Appreciate infectious disease recommendations ?Follow-up culture still pending ?-Pressor requirements: Off ?Plan ?Antibiotic day #11, day #4 penicillin, awaiting TEE and follow-up blood cultures to determine further recommendations on length of therapy ? ?Acute Hypoxic and Hypercapnic Respiratory Failure 2/2 Community Acquired Pneumonia ?Probable COPD ?Plan ?Follow intermittent chest x-ray ?He has been doing poorly on weans due to  tachypnea ?Tentative plan for tracheostomy tomorrow ?Wean PEEP/FiO2 ?Antibiotics as above ?  ?Acute metabolic encephalopathy 2/2 sepsis  ?Plan ?Continuing propofol and fentanyl ?No change in current Klonopin twice daily 1 mg ?Continue Seroquel, oxycodone ? ?Type 2 MI due to Demand Ischemia due to severe sepsis ?EKG reviewed with mild ST changes ?TTE with preserved EF ?Plan ?Check troponin ?Outpatient follow-up ?As he stabilizes we will need to add aspirin, beta-blockade, and statin ? ?Fluid and electrolyte imbalance: Hypernatremia, hyperkalemia, hyperchloremia ?-He did get 1 dose of Lasix last night ?Currently over 14 L positive sodium slowly improving, current free water deficit: 1.8 L ?Plan ?Free water replacement ?Increase D5 to 50 cc an hour ?Potassium remains elevated.  Cannot give Lokelma due to emesis.  Lasix 40 mg x 1.  Repeat labs ? ?Severe Thrombocytopenia ?Anemia of critical illness ?Received total of 3 units of blood since 3/1, no clear evidence of bleeding ?Platelets stable ?Plan ?Follow CBC, transfuse as needed ?Consult GI as he now has dark NG output and stools ?Continue PPI ? ?Acute Urinary Retention ?Plan ?Continue every 8 hour bladder scans ?In-N-Out catheters for volume greater than 300 ? ?Hyperglycemia ?Plan ?Ssi  ? 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 PSA minimally elevated so low suspicion for that being primary. AFP negative. Discussed pursuing IR biopsy in the future with family but given critical illness, patient is not currently stable enough. ?Suspect this is chronic given last IVDA occurred over 30 years ago. Viral load elevated at 27, 200,000.  ?Plan ?F/u genotype ?Re-eval when more stable  ?Family to be counciled for screening  ? ?Severe protein calorie malnutrition ?Plan  ?Tube feeds ?Nutrition  consult ? ?Liver mass ?Concern for malignancy ?Plan ?Will need work-up once he is stabilized ? ?Best Practice (right click and "Reselect all SmartList Selections" daily)  ? ?Diet/type: NPO.  Tube Feeds ?DVT prophylaxis: SCD ?GI prophylaxis: PPI ?Lines: A-line and central line. Still required.  ?Foley:  N/A ?Code Status:  full code ?Last date of multidisciplinary goals of care discussion [Wife and daughter updated at bedside on 2/28, decision was to continue full scope of care, explained that overall his prognosis is guarded] ? ?The patient is critically ill with multiple organ system failure and requires high complexity decision making for assessment and support, frequent evaluation and titration of therapies, advanced monitoring, review of radiographic studies and interpretation of complex data.  ? ?Critical Care Time devoted to patient care services, exclusive of separately billable procedures, described in this note is 35 minutes.  ? ?Marshell Garfinkel MD ?[facility] ?See Amion for pager ? ?If no response to pager , please call [phone] until 7pm ?After 7:00 pm call Elink  [phone] ?10/01/2021, 8:31 AM  ? ?

## 2021-10-01 NOTE — Progress Notes (Signed)
eLink Physician-Brief Progress Note ?Patient Name: Troy Sampson ?DOB: May 24, 1956 ?MRN: [id number] ? ? ?Date of Service ? 10/01/2021  ?HPI/Events of Note ? Hg 5.8. No clear source of bleeding. ?melena ?Hemodynamically stable. No pressor support  ?eICU Interventions ? Transfuse 1U PRBC ?Check hemoccult ?Continue PPI BID  ? ? ? ?Intervention Category ?Intermediate Interventions: Bleeding - evaluation and treatment with blood products ? ?Jermia Rigsby Mechele Collin ?10/01/2021, 4:57 AM ?

## 2021-10-01 NOTE — Progress Notes (Incomplete)
{  Select Note:[id number]} 

## 2021-10-01 NOTE — Progress Notes (Signed)
[facility]   Reason for visit: Follow up on pneumonia  Interval History:  remains intubated; Tmax 100.8, WBC slowly trending down to 19.3.  remains off pressor support.   Day 11 total antibiotics Day 4 penicillin  Physical Exam: Constitutional:  Vitals:   10/01/21 1015 10/01/21 1030  BP:    Pulse: 91 98  Resp: (!) 33 (!) 32  Temp:    SpO2: 94% 95%  No response HENT: +ET Respiratory: respiratory effort on vent    Review of Systems: Unable to be assessed due to patient factors  Lab Results  Component Value Date   WBC 19.3 (H) 10/01/2021   HGB 5.8 (LL) 10/01/2021   HCT 16.3 (L) 10/01/2021   MCV 92.1 10/01/2021   PLT 68 (L) 10/01/2021    Lab Results  Component Value Date   CREATININE 0.98 10/01/2021   BUN 61 (H) 10/01/2021   NA 146 (H) 10/01/2021   K 6.2 (H) 10/01/2021   CL 115 (H) 10/01/2021   CO2 26 10/01/2021    Lab Results  Component Value Date   ALT 84 (H) 10/01/2021   AST 141 (H) 10/01/2021   ALKPHOS 186 (H) 10/01/2021     Microbiology: Recent Results (from the past 240 hour(s))  Blood Culture (routine x 2)     Status: Abnormal   Collection Time: 09/21/21  7:02 PM   Specimen: Right Antecubital; Blood  Result Value Ref Range Status   Specimen Description   Final    RIGHT ANTECUBITAL Performed at [facility], [address] [place], [place] [address]    Special Requests   Final    BOTTLES DRAWN AEROBIC AND ANAEROBIC Blood Culture adequate volume Performed at [facility], [address] [place], [place] [address]    Culture  Setup Time   Final    GRAM POSITIVE COCCI IN BOTH AEROBIC AND ANAEROBIC BOTTLES CRITICAL RESULT CALLED TO, READ BACK BY AND VERIFIED WITH: Antoine Primas PHARMD, AT 1118 09/22/21 D. VANHOOK BY D.VANHOOK Performed at [facility], [address] [place], [place] [address]    Culture STREPTOCOCCUS PNEUMONIAE (A)  Final   Report Status 09/24/2021 FINAL  Final   Organism  ID, Bacteria STREPTOCOCCUS PNEUMONIAE  Final      Susceptibility   Streptococcus pneumoniae - MIC*    ERYTHROMYCIN >=8 RESISTANT Resistant     LEVOFLOXACIN 0.5 SENSITIVE Sensitive     VANCOMYCIN <=0.12 SENSITIVE Sensitive     PENICILLIN (meningitis) <=0.06 SENSITIVE Sensitive     PENO - penicillin <=0.06      PENICILLIN (non-meningitis) <=0.06 SENSITIVE Sensitive     PENICILLIN (oral) <=0.06 SENSITIVE Sensitive     CEFTRIAXONE (non-meningitis) <=0.12 SENSITIVE Sensitive     CEFTRIAXONE (meningitis) <=0.12 SENSITIVE Sensitive     * STREPTOCOCCUS PNEUMONIAE  Blood Culture ID Panel (Reflexed)     Status: Abnormal   Collection Time: 09/21/21  7:02 PM  Result Value Ref Range Status   Enterococcus faecalis NOT DETECTED NOT DETECTED Final   Enterococcus Faecium NOT DETECTED NOT DETECTED Final   Listeria monocytogenes NOT DETECTED NOT DETECTED Final   Staphylococcus species NOT DETECTED NOT DETECTED Final   Staphylococcus aureus (BCID) NOT DETECTED NOT DETECTED Final   Staphylococcus epidermidis NOT DETECTED NOT DETECTED Final   Staphylococcus lugdunensis NOT DETECTED NOT DETECTED Final   Streptococcus species DETECTED (A) NOT DETECTED Final    Comment: CRITICAL RESULT CALLED TO, READ BACK BY  AND VERIFIED WITHAntoine Primas PHARMD, AT 1118 09/22/21 D. VANHOOK BY D.VANHOOK    Streptococcus agalactiae NOT DETECTED NOT DETECTED Final   Streptococcus pneumoniae DETECTED (A) NOT DETECTED Final    Comment: CRITICAL RESULT CALLED TO, READ BACK BY AND VERIFIED WITH: Antoine Primas PHARMD, AT 1118 09/22/21 D. VANHOOK BY D.VANHOOK    Streptococcus pyogenes NOT DETECTED NOT DETECTED Final   A.calcoaceticus-baumannii NOT DETECTED NOT DETECTED Final   Bacteroides fragilis NOT DETECTED NOT DETECTED Final   Enterobacterales NOT DETECTED NOT DETECTED Final   Enterobacter cloacae complex NOT DETECTED NOT DETECTED Final   Escherichia coli NOT DETECTED NOT DETECTED Final   Klebsiella aerogenes NOT DETECTED NOT  DETECTED Final   Klebsiella oxytoca NOT DETECTED NOT DETECTED Final   Klebsiella pneumoniae NOT DETECTED NOT DETECTED Final   Proteus species NOT DETECTED NOT DETECTED Final   Salmonella species NOT DETECTED NOT DETECTED Final   Serratia marcescens NOT DETECTED NOT DETECTED Final   Haemophilus influenzae NOT DETECTED NOT DETECTED Final   Neisseria meningitidis NOT DETECTED NOT DETECTED Final   Pseudomonas aeruginosa NOT DETECTED NOT DETECTED Final   Stenotrophomonas maltophilia NOT DETECTED NOT DETECTED Final   Candida albicans NOT DETECTED NOT DETECTED Final   Candida auris NOT DETECTED NOT DETECTED Final   Candida glabrata NOT DETECTED NOT DETECTED Final   Candida krusei NOT DETECTED NOT DETECTED Final   Candida parapsilosis NOT DETECTED NOT DETECTED Final   Candida tropicalis NOT DETECTED NOT DETECTED Final   Cryptococcus neoformans/gattii NOT DETECTED NOT DETECTED Final    Comment: Performed at [facility], [address] [place], [place] [address]  Blood Culture (routine x 2)     Status: Abnormal   Collection Time: 09/21/21  7:15 PM   Specimen: BLOOD LEFT HAND  Result Value Ref Range Status   Specimen Description BLOOD LEFT HAND  Final   Special Requests   Final    BOTTLES DRAWN AEROBIC ONLY Blood Culture results may not be optimal due to an inadequate volume of blood received in culture bottles   Culture  Setup Time   Final    GRAM POSITIVE COCCI IN CHAINS AEROBIC BOTTLE ONLY CRITICAL VALUE NOTED.  VALUE IS CONSISTENT WITH PREVIOUSLY REPORTED AND CALLED VALUE.    Culture (A)  Final    STREPTOCOCCUS PNEUMONIAE SUSCEPTIBILITIES PERFORMED ON PREVIOUS CULTURE WITHIN THE LAST 5 DAYS.    Report Status 09/24/2021 FINAL  Final  Resp Panel by RT-PCR (Flu A&B, Covid) Nasopharyngeal Swab     Status: None   Collection Time: 09/21/21  7:18 PM   Specimen: Nasopharyngeal Swab; Nasopharyngeal(NP) swabs in vial transport medium  Result Value Ref Range Status   SARS Coronavirus 2  by RT PCR NEGATIVE NEGATIVE Final    Comment: (NOTE) SARS-CoV-2 target nucleic acids are NOT DETECTED.  The SARS-CoV-2 RNA is generally detectable in upper respiratory specimens during the acute phase of infection. The lowest concentration of SARS-CoV-2 viral copies this assay can detect is 138 copies/mL. A negative result does not preclude SARS-Cov-2 infection and should not be used as the sole basis for treatment or other patient management decisions. A negative result may occur with  improper specimen collection/handling, submission of specimen other than nasopharyngeal swab, presence of viral mutation(s) within the areas targeted by this assay, and inadequate number of viral copies(<138 copies/mL). A negative result must be combined with clinical observations, patient history, and epidemiological information. The expected result is Negative.  Fact Sheet for Patients:  BloggerCourse.com  Fact Sheet for Healthcare Providers:  SeriousBroker.it  This test is no t yet approved or cleared by the [place] FDA and  has been authorized for detection and/or diagnosis of SARS-CoV-2 by FDA under an Emergency Use Authorization (EUA). This EUA will remain  in effect (meaning this test can be used) for the duration of the COVID-19 declaration under Section 564(b)(1) of the Act, 21 U.S.C.section 360bbb-3(b)(1), unless the authorization is terminated  or revoked sooner.       Influenza A by PCR NEGATIVE NEGATIVE Final   Influenza B by PCR NEGATIVE NEGATIVE Final    Comment: (NOTE) The Xpert Xpress SARS-CoV-2/FLU/RSV plus assay is intended as an aid in the diagnosis of influenza from Nasopharyngeal swab specimens and should not be used as a sole basis for treatment. Nasal washings and aspirates are unacceptable for Xpert Xpress SARS-CoV-2/FLU/RSV testing.  Fact Sheet for Patients: BloggerCourse.com  Fact  Sheet for Healthcare Providers: SeriousBroker.it  This test is not yet approved or cleared by the [place] FDA and has been authorized for detection and/or diagnosis of SARS-CoV-2 by FDA under an Emergency Use Authorization (EUA). This EUA will remain in effect (meaning this test can be used) for the duration of the COVID-19 declaration under Section 564(b)(1) of the Act, 21 U.S.C. section 360bbb-3(b)(1), unless the authorization is terminated or revoked.  Performed at [facility], [address] [place], [place] [address]   MRSA Next Gen by PCR, Nasal     Status: None   Collection Time: 09/22/21  2:10 AM  Result Value Ref Range Status   MRSA by PCR Next Gen NOT DETECTED NOT DETECTED Final    Comment: (NOTE) The GeneXpert MRSA Assay (FDA approved for NASAL specimens only), is one component of a comprehensive MRSA colonization surveillance program. It is not intended to diagnose MRSA infection nor to guide or monitor treatment for MRSA infections. Test performance is not FDA approved in patients less than 12 years old. Performed at [facility], [address], [place], [place] [address]   Culture, blood (routine x 2)     Status: None   Collection Time: 09/25/21  9:33 AM   Specimen: BLOOD  Result Value Ref Range Status   Specimen Description BLOOD SITE NOT SPECIFIED  Final   Special Requests   Final    BOTTLES DRAWN AEROBIC ONLY Blood Culture results may not be optimal due to an inadequate volume of blood received in culture bottles   Culture   Final    NO GROWTH 5 DAYS Performed at [facility], [address] [place], [place] [address]    Report Status 09/30/2021 FINAL  Final  Culture, blood (routine x 2)     Status: None   Collection Time: 09/25/21  9:33 AM   Specimen: BLOOD  Result Value Ref Range Status   Specimen Description BLOOD SITE NOT SPECIFIED  Final   Special Requests   Final    BOTTLES DRAWN AEROBIC ONLY  Blood Culture results may not be optimal due to an inadequate volume of blood received in culture bottles   Culture   Final    NO GROWTH 5 DAYS Performed at [facility], [address], [place], [place] [address]    Report Status 09/30/2021 FINAL  Final  Culture, Respiratory w Gram Stain     Status: None   Collection Time: 09/26/21 10:30 AM   Specimen: Tracheal Aspirate; Respiratory  Result Value Ref Range Status  Specimen Description TRACHEAL ASPIRATE  Final   Special Requests NONE  Final   Gram Stain   Final    FEW WBC PRESENT,BOTH PMN AND MONONUCLEAR RARE GRAM POSITIVE COCCOBACILLUS    Culture   Final    RARE Normal respiratory flora-no Staph aureus or Pseudomonas seen Performed at [facility], [address] [place], [place] [id number]    Report Status 09/28/2021 FINAL  Final    Impression/Plan:  1. Pneumococcal pneumonia - remains on penicillin and will continue this for now. CXR stable.   TEE planned at some point.    2.  Respiratory failure - remains on the vent with poor ability to wean. Plan for tracheostomy noted for tomorrow.    3.  Cirrhosis - underlying chronic hepatitis C that is active with a positive viral RNA of 27 million.  Genotype 1a.  He can be considered for treatment as an outpatient.    4. Thrombocytopenia - prior to hospitalization his platelet count was normal though was over 2 years ago.  It is unclear if related to his acute illness or chronic due to cirrhosis.  Will continue to monitor.    Dr. Daiva EvesVan Dam available over the weekend if needed, otherwise I will follow up again on Monday

## 2021-10-01 NOTE — Progress Notes (Signed)
Lower extremity venous bilateral and upper extremity venous bilateral study completed.  ? ?Please see CV Proc for preliminary results.  ? ?Trevionne Advani, RDMS, RVT ? ?

## 2021-10-02 ENCOUNTER — Inpatient Hospital Stay (HOSPITAL_COMMUNITY): Payer: Medicare Other

## 2021-10-02 DIAGNOSIS — E878 Other disorders of electrolyte and fluid balance, not elsewhere classified: Secondary | ICD-10-CM

## 2021-10-02 LAB — POCT I-STAT 7, (LYTES, BLD GAS, ICA,H+H)
Acid-Base Excess: 5 mmol/L — ABNORMAL HIGH (ref 0.0–2.0)
Bicarbonate: 29.6 mmol/L — ABNORMAL HIGH (ref 20.0–28.0)
Calcium, Ion: 1.18 mmol/L (ref 1.15–1.40)
HCT: 22 % — ABNORMAL LOW (ref 39.0–52.0)
Hemoglobin: 7.5 g/dL — ABNORMAL LOW (ref 13.0–17.0)
O2 Saturation: 92 %
Patient temperature: 99
Potassium: 4.3 mmol/L (ref 3.5–5.1)
Sodium: 144 mmol/L (ref 135–145)
TCO2: 31 mmol/L (ref 22–32)
pCO2 arterial: 42 mmHg (ref 32–48)
pH, Arterial: 7.457 — ABNORMAL HIGH (ref 7.35–7.45)
pO2, Arterial: 63 mmHg — ABNORMAL LOW (ref 83–108)

## 2021-10-02 LAB — TYPE AND SCREEN
ABO/RH(D): B POS
Antibody Screen: NEGATIVE
Unit division: 0
Unit division: 0
Unit division: 0

## 2021-10-02 LAB — PHOSPHORUS: Phosphorus: 4.4 mg/dL (ref 2.5–4.6)

## 2021-10-02 LAB — CBC
HCT: 21 % — ABNORMAL LOW (ref 39.0–52.0)
HCT: 21.3 % — ABNORMAL LOW (ref 39.0–52.0)
Hemoglobin: 7.4 g/dL — ABNORMAL LOW (ref 13.0–17.0)
Hemoglobin: 7.5 g/dL — ABNORMAL LOW (ref 13.0–17.0)
MCH: 30.8 pg (ref 26.0–34.0)
MCH: 31.3 pg (ref 26.0–34.0)
MCHC: 35.2 g/dL (ref 30.0–36.0)
MCHC: 35.2 g/dL (ref 30.0–36.0)
MCV: 87.5 fL (ref 80.0–100.0)
MCV: 88.8 fL (ref 80.0–100.0)
Platelets: 98 10*3/uL — ABNORMAL LOW (ref 150–400)
Platelets: 128 10*3/uL — ABNORMAL LOW (ref 150–400)
RBC: 2.4 MIL/uL — ABNORMAL LOW (ref 4.22–5.81)
RBC: 2.4 MIL/uL — ABNORMAL LOW (ref 4.22–5.81)
RDW: 17.8 % — ABNORMAL HIGH (ref 11.5–15.5)
RDW: 17.5 % — ABNORMAL HIGH (ref 11.5–15.5)
WBC: 20.1 10*3/uL — ABNORMAL HIGH (ref 4.0–10.5)
WBC: 18.7 10*3/uL — ABNORMAL HIGH (ref 4.0–10.5)
nRBC: 0.9 % — ABNORMAL HIGH (ref 0.0–0.2)
nRBC: 0.8 % — ABNORMAL HIGH (ref 0.0–0.2)

## 2021-10-02 LAB — BPAM RBC
Blood Product Expiration Date: 202303062359
Blood Product Expiration Date: 202303092359
Blood Product Expiration Date: 202303172359
ISSUE DATE / TIME: 202303011103
ISSUE DATE / TIME: 202303020431
ISSUE DATE / TIME: 202303030535
Unit Type and Rh: 1700
Unit Type and Rh: 7300
Unit Type and Rh: 7300

## 2021-10-02 LAB — COMPREHENSIVE METABOLIC PANEL
ALT: 84 U/L — ABNORMAL HIGH (ref 0–44)
AST: 131 U/L — ABNORMAL HIGH (ref 15–41)
Albumin: <1.5 g/dL — ABNORMAL LOW (ref 3.5–5.0)
Alkaline Phosphatase: 180 U/L — ABNORMAL HIGH (ref 38–126)
Anion gap: 7 (ref 5–15)
BUN: 59 mg/dL — ABNORMAL HIGH (ref 8–23)
CO2: 26 mmol/L (ref 22–32)
Calcium: 7.6 mg/dL — ABNORMAL LOW (ref 8.9–10.3)
Chloride: 108 mmol/L (ref 98–111)
Creatinine, Ser: 0.85 mg/dL (ref 0.61–1.24)
GFR, Estimated: >60 mL/min (ref >60)
Glucose, Bld: 85 mg/dL (ref 70–99)
Potassium: 5.2 mmol/L — ABNORMAL HIGH (ref 3.5–5.1)
Sodium: 141 mmol/L (ref 135–145)
Total Bilirubin: 1.9 mg/dL — ABNORMAL HIGH (ref 0.3–1.2)
Total Protein: 5.6 g/dL — ABNORMAL LOW (ref 6.5–8.1)

## 2021-10-02 LAB — GLUCOSE, CAPILLARY
Glucose-Capillary: 103 mg/dL — ABNORMAL HIGH (ref 70–99)
Glucose-Capillary: 81 mg/dL (ref 70–99)
Glucose-Capillary: 83 mg/dL (ref 70–99)
Glucose-Capillary: 88 mg/dL (ref 70–99)
Glucose-Capillary: 97 mg/dL (ref 70–99)
Glucose-Capillary: 97 mg/dL (ref 70–99)

## 2021-10-02 LAB — MAGNESIUM: Magnesium: 2.3 mg/dL (ref 1.7–2.4)

## 2021-10-02 MED ORDER — FUROSEMIDE 10 MG/ML IJ SOLN
40.0000 mg | Freq: Two times a day (BID) | INTRAMUSCULAR | Status: AC
  Administered 2021-10-02 (×2): 40 mg via INTRAVENOUS
  Filled 2021-10-02 (×2): qty 4

## 2021-10-02 MED ORDER — SODIUM CHLORIDE 0.9 % IV BOLUS
500.0000 mL | Freq: Once | INTRAVENOUS | Status: AC
  Administered 2021-10-02: 500 mL via INTRAVENOUS

## 2021-10-02 MED ORDER — ETOMIDATE 2 MG/ML IV SOLN
20.0000 mg | Freq: Once | INTRAVENOUS | Status: AC
  Administered 2021-10-02: 20 mg via INTRAVENOUS
  Filled 2021-10-02: qty 10

## 2021-10-02 MED ORDER — FREE WATER: 200.0000 mL | Status: DC

## 2021-10-02 MED ORDER — VECURONIUM BROMIDE 10 MG IV SOLR
10.0000 mg | Freq: Once | INTRAVENOUS | Status: AC
  Administered 2021-10-02: 10 mg via INTRAVENOUS
  Filled 2021-10-02: qty 10

## 2021-10-02 MED ORDER — NOREPINEPHRINE 4 MG/250ML-% IV SOLN
0.0000 ug/min | INTRAVENOUS | Status: DC
  Administered 2021-10-02: 2 ug/min via INTRAVENOUS

## 2021-10-02 NOTE — Progress Notes (Signed)
Sure ? ?NAME:  Troy Sampson, MRN:  [id number], DOB:  Feb 04, 1956, LOS: 10 ?ADMISSION DATE:  09/21/2021, CONSULTATION DATE:  09/22/2021 ?REFERRING MD: Dr. Fredderick Phenix CHIEF COMPLAINT:  SOB  ? ?History of Present Illness:  ?[name] is a 66 y/o male with a PMHx of duodenal ulcer, IDA, nephrolithiasis who presented to the ED with SOB. On arrival, he was obtained 2/2 severe hypoxia and subsequently intubated emergently.  ? ?Per family and chart review, patient is a smoker but no know history of COPD. No past medical history of alcohol abuse.  ? ?Pertinent  Medical History  ?Duodenal ulcer with IDA ? ?Significant Hospital Events: ?Including procedures, antibiotic start and stop dates in addition to other pertinent events   ?2/22: Admitted overnight to ICU. Blood cultures 3/4 positive for strep pneumo. Strep pneumo antigen positive, TTE with EF 50 to 55% no wall motion abnormalities ?2/26: New onset fevers. Antibiotics broadened to Cefepime and Vancomycin. Propofol switched to Precedex.  ?2/27: Bradycardia noted on Precedex with increased tachypnea.  ?2/28, antibiotics changed to penicillin G per infectious disease recommendation ?3/1: Vasoactive drip requirements improving, received 1 unit of blood for hemoglobin of 6.1 ?3/2 follow-up hemoglobin 6.8, received second unit of blood, has also received 3 units of platelets since admission.  Off norepinephrine  ?Interim History / Subjective:  ? ?Had EGD yesterday which showed friable esophageal mucosa with active signs of bleeding from gastric fundus and esophagus ?Hemoglobin remained stable after 1 unit PRBC transfusion ? ?Objective   ?Blood pressure 101/73, pulse 91, temperature 97.7 ?F (36.5 ?C), temperature source Axillary, resp. rate (!) 35, height 5\' 7"  (1.702 m), weight 57.4 kg, SpO2 98 %. ?   ?Vent Mode: PRVC ?FiO2 (%):  [45 %-50 %] 45 % ?Set Rate:  [35 bmp] 35 bmp ?Vt Set:  [500 mL] 500 mL ?PEEP:  [8 cmH20] 8 cmH20 ?Plateau Pressure:  [22 cmH20-26 cmH20] 24 cmH20   ? ?Intake/Output Summary (Last 24 hours) at 10/02/2021 0755 ?Last data filed at 10/02/2021 0600 ?Gross per 24 hour  ?Intake 2278.54 ml  ?Output 3700 ml  ?Net -1421.46 ml  ? ?Filed Weights  ? 09/27/21 0414 09/29/21 0500 09/30/21 0500  ?Weight: 54.2 kg 57.3 kg 57.4 kg  ? ?Examination:  ?  ?Physical exam: ?General: Crtitically ill-appearing male, orally intubated ?HEENT: [place]/AT, eyes anicteric.  ETT and OGT in place ?Neuro: Sedated, not following commands.  Eyes are closed.  Pupils 3 mm bilateral reactive to light ?Chest: Coarse breath sounds, rhonchorous on the left, no wheezes ?Heart: Regular rate and rhythm, no murmurs or gallops ?Abdomen: Soft, nontender, nondistended, bowel sounds present ?Skin: No rash ? ? ? ?Labs/imaging reviewed ?Significant for room 141, potassium 5.2, BUN/creatinine 59/0.85 ?WBC count remains elevated at 20.1, hemoglobin 7.4, platelets 98 ? ? ?Resolved Hospital Problem list   ?#Lactic Acidosis  ? ?Assessment & Plan:  ? ?Principal Problem: ?  Pneumonia, pneumococcal (HCC) ?Active Problems: ?  Acute respiratory failure with hypoxia (HCC) ?  Sepsis (HCC) ?  Pressure injury of skin ?  Pneumococcal bacteremia ?  Acute metabolic encephalopathy ?  Alteration in electrolyte and fluid balance ?  Hepatitis C ?  Liver mass ?  Anemia ?  NSTEMI (non-ST elevated myocardial infarction) (HCC) ?  Thrombocytopenia (HCC) ?  Protein-calorie malnutrition, severe ? ?Severe Sepsis with septic shock 2/2 Strep Pneumo CAP + Bacteremia  ?Appreciate infectious disease recommendations ?Repeat cultures have been negative ?Patient remains off vasopressors ?Shock has resolved ?Patient is a still on antibiotic total daily  12 ?Currently on penicillin G for day 5 ?TEE is pending ? ?Acute Hypoxic and Hypercapnic Respiratory Failure 2/2 Community Acquired Pneumonia ?Probable COPD ?After diuresis x-ray showed some improvement in bilateral lower lobe infiltrates ?We will diurese him again ?Placed on pressure support trial, he is to  but generating good tidal volume ?Tentative plan for tracheostomy  ?Wean PEEP/FiO2 ?  ?Acute metabolic/septic encephalopathy ?Continuing to titrate sedation, propofol and fentanyl, RASS goal -1 ?No change in current Klonopin twice daily 1 mg ?Continue Seroquel, oxycodone ? ?Type 2 MI due to Demand Ischemia due to severe sepsis ?EKG reviewed with mild ST changes ?TTE with preserved EF ?Outpatient follow-up ?As he stabilizes we will need to add aspirin, beta-blockade, and statin ? ?Fluid and electrolyte imbalance: Hypernatremia, hyperkalemia, hyperchloremia/hypophosphatemia ?Serum sodium and serum potassium levels are improving ?We will give him Lasix 40 mg twice daily ?Decrease free water to 200 every 6 hours ?Continue D5 to 50 cc an hour ? ?Severe Thrombocytopenia ?Acute blood loss anemia on top of critical illness ?Upper GI bleeding from lower esophagus and stomach ?Received total of 3 units of blood since 3/1, source of bleeding identified in the esophagus on EGD ?Continue PPI every 12 ?Platelets are trending up now ? ?Acute Urinary Retention ?Continue every 8 hour bladder scans ?In-N-Out catheters for volume greater than 300 ? 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 PSA minimally elevated so low suspicion for that being primary. AFP negative. Discussed pursuing IR biopsy in the future with family but given critical illness, patient is not currently stable enough. ?Suspect this is chronic given last IVDA occurred over 30 years ago. Viral load elevated at 27, 200,000.  ?Re-eval when more stable  ?Family to be counciled for screening  ? ?Severe protein calorie malnutrition ?Continue Tube feeds ?Nutrition consult ? ?Best Practice (right click and "Reselect all SmartList Selections" daily)  ? ?Diet/type: NPO.  Tube Feeds ?DVT prophylaxis:  SCD ?GI prophylaxis: PPI ?Lines: A-line and central line. Still required.  ?Foley:  N/A ?Code Status:  full code ?Last date of multidisciplinary goals of care discussion [Wife and daughter updated at bedside on 2/28, decision was to continue full scope of care, explained that overall his prognosis is guarded] ? ? ? ?Total critical care time: 35 minutes ? ?Performed by: Cheri Fowler ?  ?Critical care time was exclusive of separately billable procedures and treating other patients. ?  ?Critical care was necessary to treat or prevent imminent or life-threatening deterioration. ?  ?Critical care was time spent personally by me on the following activities: development of treatment plan with patient and/or surrogate as well as nursing, discussions with consultants, evaluation of patient's response to treatment, examination of patient, obtaining history from patient or surrogate, ordering and performing treatments and interventions, ordering and review of laboratory studies, ordering and review of radiographic studies, pulse oximetry and re-evaluation of patient's condition. ?  ?Cheri Fowler MD ?[facility] ?See Amion for pager ?If no response to pager, please call [phone] until 7pm ?After 7pm, Please call E-link [phone] ? ? ?

## 2021-10-02 NOTE — Procedures (Signed)
Diagnostic Bronchoscopy ? ?Troy Sampson  ?[id number]  ?[birth year] ? ?Date:10/02/21  ?Time:2:56 PM  ? ?Provider Performing:Karee Forge Ephriam Knuckles  ? ?Procedure: Diagnostic Bronchoscopy (50932) ? ?Indication(s) ?Assist with direct visualization of tracheostomy placement ? ?Consent ?Risks of the procedure as well as the alternatives and risks of each were explained to the patient and/or caregiver.  Consent for the procedure was obtained. ? ? ?Anesthesia ?See separate tracheostomy note ? ? ?Time Out ?Verified patient identification, verified procedure, site/side was marked, verified correct patient position, special equipment/implants available, medications/allergies/relevant history reviewed, required imaging and test results available. ? ? ?Sterile Technique ?Usual hand hygiene, masks, gowns, and gloves were used ? ? ?Procedure Description ?Bronchoscope advanced through endotracheal tube and into airway.  After suctioning out tracheal secretions, bronchoscope used to provide direct visualization of tracheostomy placement. ? ? ?Complications/Tolerance ?None; patient tolerated the procedure well. ? ? ?EBL ?None ? ?Specimen(s) ?None  ? ?Troy Radon, MD ?Internal Medicine Resident PGY-3 ?[facility] ?Pager: [phone] ?10/02/2021 2:56 PM  ?  ?

## 2021-10-02 NOTE — Procedures (Signed)
Percutaneous Tracheostomy Procedure Note ? ? ?Troy Sampson  ?[id number]  ?May 22, 1956 ? ?Date:10/02/21  ?Time:3:08 PM  ? ?Provider Performing:Leone Mobley ? ?Procedure: Percutaneous Tracheostomy with Bronchoscopic Guidance (93790) ? ?Indication(s) ?Acute respiratory failure ? ?Consent ?Risks of the procedure as well as the alternatives and risks of each were explained to the patient and/or caregiver.  Consent for the procedure was obtained. ? ?Anesthesia ?Etomidate, Versed, Fentanyl, Vecuronium ? ? ?Time Out ?Verified patient identification, verified procedure, site/side was marked, verified correct patient position, special equipment/implants available, medications/allergies/relevant history reviewed, required imaging and test results available. ? ? ?Sterile Technique ?Maximal sterile technique including sterile barrier drape, hand hygiene, sterile gown, sterile gloves, mask, hair covering. ? ? ? ?Procedure Description ?Appropriate anatomy identified by palpation.  Patient's neck prepped and draped in sterile fashion.  1% lidocaine with epinephrine was used to anesthetize skin overlying neck.  1.5cm incision made and blunt dissection performed until tracheal rings could be easily palpated.   Then a size 8 Shiley tracheostomy was placed under bronchoscopic visualization using usual Seldinger technique and serial dilation.   Bronchoscope confirmed placement above the carina.  Tracheostomy was sutured in place with adhesive pad to protect skin under pressure.    Patient connected to ventilator. ? ? ?Complications/Tolerance ?None; patient tolerated the procedure well. ?Chest X-ray is ordered to confirm no post-procedural complication. ? ? ?EBL ?Minimal ? ? ?Specimen(s) ?None  ? ?

## 2021-10-02 NOTE — Progress Notes (Addendum)
eLink Physician-Brief Progress Note ?Patient Name: Troy Sampson ?DOB: 07-Nov-1955 ?MRN: [id number] ? ? ?Date of Service ? 10/02/2021  ?HPI/Events of Note ? Camera: ?On going melena, not new. New trach from today. Cachectic.  ?NG tube. On low dose levophed, sedation. ?40% fio2, in synchrony. HR 80, MAP > 65. ? ?Hep C, steatosis. GI bleeding-esophagitis, s/p EGD from 3 rd. Thrombocytopenia at 98K, last Hg 7.4 from AMCr normal.    ?eICU Interventions ? Get stat CBC. If develops fran red bleeding to for CTA abdomen/pelvis/IR intervention.  ? ?Full code.   ? ? ? ?Intervention Category ?Intermediate Interventions: Bleeding - evaluation and treatment with blood products ? ?Ranee Gosselin ?10/02/2021, 7:10 PM ? ?21:55 ? ?Bedside RN reporting dysynch with vent having to give PRN bolused of Fent,   asking for ABG's since he was just trached today as well as KUB to check placement of NG tube, RN states TF D/C days ago and would like to see about resuming scheduled meds which might help with agitation ? ?Camera: ?500/8/35/40%, PIP 29. In synchrony. ?Sats 96%, no tachycardia. ?Already on propofol and fentanyl gtt. ? ?Discussed with RN. ?- ABG stat.  ?- no earlier seroquil or ativan via NG , tonight. Can be started from tomorrow. ?- KUB stat ? ?22:55 ?Hg stable at 7.5 ?7.47/42/63/31. RN has increased fio2 from 40 to 50%. ?Continue care. ?X ray pending.  ?

## 2021-10-03 ENCOUNTER — Inpatient Hospital Stay (HOSPITAL_COMMUNITY): Payer: Medicare Other

## 2021-10-03 ENCOUNTER — Encounter (HOSPITAL_COMMUNITY): Payer: Self-pay | Admitting: Gastroenterology

## 2021-10-03 DIAGNOSIS — R160 Hepatomegaly, not elsewhere classified: Secondary | ICD-10-CM

## 2021-10-03 LAB — BASIC METABOLIC PANEL
Anion gap: 8 (ref 5–15)
BUN: 44 mg/dL — ABNORMAL HIGH (ref 8–23)
CO2: 28 mmol/L (ref 22–32)
Calcium: 7.6 mg/dL — ABNORMAL LOW (ref 8.9–10.3)
Chloride: 104 mmol/L (ref 98–111)
Creatinine, Ser: 0.73 mg/dL (ref 0.61–1.24)
GFR, Estimated: >60 mL/min (ref >60)
Glucose, Bld: 98 mg/dL (ref 70–99)
Potassium: 4.2 mmol/L (ref 3.5–5.1)
Sodium: 140 mmol/L (ref 135–145)

## 2021-10-03 LAB — GLUCOSE, CAPILLARY
Glucose-Capillary: 70 mg/dL (ref 70–99)
Glucose-Capillary: 99 mg/dL (ref 70–99)
Glucose-Capillary: 92 mg/dL (ref 70–99)
Glucose-Capillary: 121 mg/dL — ABNORMAL HIGH (ref 70–99)
Glucose-Capillary: 108 mg/dL — ABNORMAL HIGH (ref 70–99)
Glucose-Capillary: 96 mg/dL (ref 70–99)

## 2021-10-03 LAB — CBC
HCT: 20.3 % — ABNORMAL LOW (ref 39.0–52.0)
Hemoglobin: 7.3 g/dL — ABNORMAL LOW (ref 13.0–17.0)
MCH: 31.6 pg (ref 26.0–34.0)
MCHC: 36 g/dL (ref 30.0–36.0)
MCV: 87.9 fL (ref 80.0–100.0)
Platelets: 139 10*3/uL — ABNORMAL LOW (ref 150–400)
RBC: 2.31 MIL/uL — ABNORMAL LOW (ref 4.22–5.81)
RDW: 17.2 % — ABNORMAL HIGH (ref 11.5–15.5)
WBC: 17.8 10*3/uL — ABNORMAL HIGH (ref 4.0–10.5)
nRBC: 0.4 % — ABNORMAL HIGH (ref 0.0–0.2)

## 2021-10-03 LAB — MAGNESIUM: Magnesium: 2.1 mg/dL (ref 1.7–2.4)

## 2021-10-03 MED ORDER — HEPARIN SODIUM (PORCINE) 5000 UNIT/ML IJ SOLN
5000.0000 [IU] | Freq: Three times a day (TID) | INTRAMUSCULAR | Status: DC
  Administered 2021-10-03 – 2021-10-05 (×4): 5000 [IU] via SUBCUTANEOUS
  Filled 2021-10-03 (×5): qty 1

## 2021-10-03 MED ORDER — FREE WATER
100.0000 mL | Status: DC
  Administered 2021-10-03 – 2021-10-04 (×5): 100 mL

## 2021-10-03 MED ORDER — PANCRELIPASE (LIP-PROT-AMYL) 10440-39150 UNITS PO TABS
20880.0000 [IU] | ORAL_TABLET | Freq: Once | ORAL | Status: AC
  Administered 2021-10-03: 20880 [IU]
  Filled 2021-10-03: qty 2

## 2021-10-03 MED ORDER — SODIUM BICARBONATE 650 MG PO TABS
650.0000 mg | ORAL_TABLET | Freq: Once | ORAL | Status: AC
  Administered 2021-10-03: 650 mg
  Filled 2021-10-03: qty 1

## 2021-10-03 NOTE — Evaluation (Addendum)
Physical Therapy Evaluation ?Patient Details ?Name: Troy Sampson ?MRN: [id number] ?DOB: 1956/04/23 ?Today's Date: 10/03/2021 ? ?History of Present Illness ? Pt is a 66 y/o male who presented to the ED 2/22 with SOB. After arrival, pt became obtunded due to severe hypoxia and subsequently intubated emergently. Pt admitted with dx of acute hypoxic/hypercapnic respiratory failure 2nd to community acquired pneumonia.  Further testing revealed Hep C, suspected COPD, and potential underlying malignancy. Pt underwent tracheostomy 3/4. PMHx: duodenal ulcer, IDA, nephrolithiasis, ETOH ?  ?Clinical Impression ? Pt admitted with above diagnosis. PTA pt lived at home with his wife. Pt currently with functional limitations due to the deficits listed below (see PT Problem List). On eval, pt required +2 total assist rolling. Eyes open but pt not attending to therapist. Not following simple commands. Unable to elicit active movement BUE/LE but PROM intact. Edema noted BUE. Pt will benefit from skilled PT to increase their independence and safety with mobility to allow discharge to the venue listed below.  Unsure if pt will be able to actively participate in therapy. Pt currently on low-dose fentanyl with plans to continue weaning sedation. PT will follow to determine appropriateness/ability to participate as sedation is weaned as well as further assess discharge needs/recommendations.  ?   ?   ? ?Recommendations for follow up therapy are one component of a multi-disciplinary discharge planning process, led by the attending physician.  Recommendations may be updated based on patient status, additional functional criteria and insurance authorization. ? ?Follow Up Recommendations  (TBD) ? ?  ?Assistance Recommended at Discharge Frequent or constant Supervision/Assistance  ?Patient can return home with the following ?   ? ?  ?Equipment Recommendations  (TBD)  ?Recommendations for Other Services ?    ?  ?Functional Status Assessment Patient  has had a recent decline in their functional status and/or demonstrates limited ability to make significant improvements in function in a reasonable and predictable amount of time  ? ?  ?Precautions / Restrictions Precautions ?Precautions: Other (comment) ?Precaution Comments: vent via trach, NG tube ?Restrictions ?Weight Bearing Restrictions: No  ? ?  ? ?Mobility ? Bed Mobility ?Overal bed mobility: Needs Assistance ?Bed Mobility: Rolling ?Rolling: +2 for physical assistance, Total assist ?  ?  ?  ?  ?General bed mobility comments: no active participation from pt ?  ? ?Transfers ?  ?  ?  ?  ?  ?  ?  ?  ?  ?General transfer comment: unable ?  ? ?Ambulation/Gait ?  ?  ?  ?  ?  ?  ?  ?General Gait Details: unable ? ?Stairs ?  ?  ?  ?  ?  ? ?Wheelchair Mobility ?  ? ?Modified Rankin (Stroke Patients Only) ?  ? ?  ? ?Balance   ?  ?  ?  ?  ?  ?  ?  ?  ?  ?  ?  ?  ?  ?  ?  ?  ?  ?  ?   ? ? ? ?Pertinent Vitals/Pain Pain Assessment ?Facial Expression: Relaxed, neutral ?Body Movements: Absence of movements ?Muscle Tension: Relaxed ?Compliance with ventilator (intubated pts.): Tolerating ventilator or movement ?Vocalization (extubated pts.): N/A ?CPOT Total: 0  ? ? ?Home Living Family/patient expects to be discharged to:: Private residence ?Living Arrangements: Spouse/significant other ?Available Help at Discharge: Family ?  ?  ?  ?  ?  ?  ?  ?Additional Comments: Pt unable to provide home information. No family present.  ?  ?  Prior Function Prior Level of Function : Independent/Modified Independent ?  ?  ?  ?  ?  ?  ?  ?  ?  ? ? ?Hand Dominance  ?   ? ?  ?Extremity/Trunk Assessment  ? Upper Extremity Assessment ?Upper Extremity Assessment: Generalized weakness (edema noted bilat. No active movement noted. PROM intact) ?  ? ?Lower Extremity Assessment ?Lower Extremity Assessment: Generalized weakness (no active movement noted. PROM intact.) ?  ? ?   ?Communication  ? Communication: Tracheostomy  ?Cognition Arousal/Alertness:  Lethargic ?Behavior During Therapy: Flat affect ?Overall Cognitive Status: Difficult to assess ?  ?  ?  ?  ?  ?  ?  ?  ?  ?  ?  ?  ?  ?  ?  ?  ?General Comments: Sleepy. Eyes open. Little to no interaction with therapist. Not following simple commands. ?  ?  ? ?  ?General Comments   ? ?  ?Exercises    ? ?Assessment/Plan  ?  ?PT Assessment Patient needs continued PT services  ?PT Problem List Decreased strength;Decreased mobility;Decreased activity tolerance;Decreased cognition;Decreased balance;Cardiopulmonary status limiting activity ? ?   ?  ?PT Treatment Interventions Therapeutic activities;Cognitive remediation;Therapeutic exercise;Patient/family education;Balance training;Functional mobility training;Neuromuscular re-education   ? ?PT Goals (Current goals can be found in the Care Plan section)  ?Acute Rehab PT Goals ?Patient Stated Goal: unable to state ?PT Goal Formulation: Patient unable to participate in goal setting ?Time For Goal Achievement: 10/17/21 ?Potential to Achieve Goals: Poor ? ?  ?Frequency Min 2X/week ?  ? ? ?Co-evaluation   ?  ?  ?  ?  ? ? ?  ?AM-PAC PT "6 Clicks" Mobility  ?Outcome Measure Help needed turning from your back to your side while in a flat bed without using bedrails?: Total ?Help needed moving from lying on your back to sitting on the side of a flat bed without using bedrails?: Total ?Help needed moving to and from a bed to a chair (including a wheelchair)?: Total ?Help needed standing up from a chair using your arms (e.g., wheelchair or bedside chair)?: Total ?Help needed to walk in hospital room?: Total ?Help needed climbing 3-5 steps with a railing? : Total ?6 Click Score: 6 ? ?  ?End of Session Equipment Utilized During Treatment: Oxygen (vent via trach) ?Activity Tolerance: Patient limited by lethargy;Patient limited by fatigue ?Patient left: in bed ?Nurse Communication: Mobility status ?PT Visit Diagnosis: Other abnormalities of gait and mobility (R26.89);Muscle weakness  (generalized) (M62.81) ?  ? ?Time: 0947-0962 ?PT Time Calculation (min) (ACUTE ONLY): 12 min ? ? ?Charges:   PT Evaluation ?$PT Eval Moderate Complexity: 1 Mod ?  ?  ?   ? ? ?Aida Raider, PT  ?Office # [phone] ?Pager [phone] ? ? ?Ilda Foil ?10/03/2021, 9:08 AM ? ?

## 2021-10-03 NOTE — Progress Notes (Signed)
Per CCM MD, okay for patient's RR to be in the high 30s while on SBT.  ?

## 2021-10-03 NOTE — Progress Notes (Signed)
SLP Cancellation Note ? ?Patient Details ?Name: Troy Sampson ?MRN: [id number] ?DOB: 1956/03/26 ? ? ?Cancelled treatment:       Reason Eval/Treat Not Completed: Medical issues which prohibited therapy;Fatigue/lethargy limiting ability to participate;Patient not medically ready;Patient's level of consciousness ? ?Pt remains semi-sedated and on ventilator, not appropriate for PMV or PO trials at this time. SLP service will follow up as indicated. ? ?Troy Sampson, M.S., Troy Sampson ?Speech-Language Pathologist ?Acute Rehabilitation Services ?Pager: [phone] ? ?Troy Sampson ?10/03/2021, 9:48 AM ?

## 2021-10-03 NOTE — Progress Notes (Signed)
Instructed this morning to restart tube feeds and attempt medications through the cortrak. Upon assessment the cortrak was clogged. The declogging protocol was initiated but was not successful. Notified ICU MD and he instructed to place NG tube. Awaiting x-ray to verify placement. Cortrak was left in place for now.  ?

## 2021-10-03 NOTE — Progress Notes (Signed)
Sure ? ?NAME:  Troy Sampson, MRN:  [id number], DOB:  1956-04-30, LOS: 11 ?ADMISSION DATE:  09/21/2021, CONSULTATION DATE:  09/22/2021 ?REFERRING MD: Dr. Fredderick Phenix CHIEF COMPLAINT:  SOB  ? ?History of Present Illness:  ?Troy Sampson is a 66 y/o male with a PMHx of duodenal ulcer, IDA, nephrolithiasis who presented to the ED with SOB. On arrival, he was obtained 2/2 severe hypoxia and subsequently intubated emergently.  ? ?Per family and chart review, patient is a smoker but no know history of COPD. No past medical history of alcohol abuse.  ? ?Pertinent  Medical History  ?Duodenal ulcer with IDA ? ?Significant Hospital Events: ?Including procedures, antibiotic start and stop dates in addition to other pertinent events   ?2/22: Admitted overnight to ICU. Blood cultures 3/4 positive for strep pneumo. Strep pneumo antigen positive, TTE with EF 50 to 55% no wall motion abnormalities ?2/26: New onset fevers. Antibiotics broadened to Cefepime and Vancomycin. Propofol switched to Precedex.  ?2/27: Bradycardia noted on Precedex with increased tachypnea.  ?2/28, antibiotics changed to penicillin G per infectious disease recommendation ?3/1: Vasoactive drip requirements improving, received 1 unit of blood for hemoglobin of 6.1 ?3/2 follow-up hemoglobin 6.8, received second unit of blood, has also received 3 units of platelets since admission.  Off norepinephrine  ?Interim History / Subjective:  ?Had tracheostomy yesterday, tolerated well ?Last night he had melena with a stable hemoglobin ?Remained afebrile ? ?Objective   ?Blood pressure (!) 145/73, pulse (!) 102, temperature 97.9 ?F (36.6 ?C), temperature source Oral, resp. rate (!) 36, height 5\' 7"  (1.702 m), weight 57.4 kg, SpO2 99 %. ?   ?Vent Mode: PSV;CPAP ?FiO2 (%):  [40 %-50 %] 40 % ?Set Rate:  [35 bmp] 35 bmp ?Vt Set:  [500 mL] 500 mL ?PEEP:  [8 cmH20] 8 cmH20 ?Pressure Support:  [8 cmH20] 8 cmH20 ?Plateau Pressure:  [21 cmH20-27 cmH20] 21 cmH20  ? ?Intake/Output Summary  (Last 24 hours) at 10/03/2021 0819 ?Last data filed at 10/03/2021 0600 ?Gross per 24 hour  ?Intake 2363.99 ml  ?Output 3495 ml  ?Net -1131.01 ml  ? ?Filed Weights  ? 09/27/21 0414 09/29/21 0500 09/30/21 0500  ?Weight: 54.2 kg 57.3 kg 57.4 kg  ? ?Examination:  ?  ?Physical exam: ?General: Crtitically ill-appearing cachectic male, s/p trach ?HEENT: [place]/AT, eyes anicteric.  Moist mucous membranes ?Neuro: Eyes open, not following commands, positive blink to threat reflex ?Chest: Tachypneic, coarse breath sounds, rhonchorous on the left, no wheezes ?Heart: Regular rate and rhythm, no murmurs or gallops ?Abdomen: Soft, nontender, nondistended, bowel sounds present ?Skin: No rash ? ? ?Labs/imaging reviewed ?Significant for room 140, potassium 4.2, BUN/creatinine 44/0.73 ?WBC count remains elevated at 17.8, hemoglobin 7.3, platelets 139 ? ? ?Resolved Hospital Problem list   ?Lactic Acidosis  ?Septic shock ?Hypernatremia/hyperkalemia hyperchloremia/hypophosphatemia ? ?Assessment & Plan:  ? ?Principal Problem: ?  Pneumonia, pneumococcal (HCC) ?Active Problems: ?  Acute respiratory failure with hypoxia (HCC) ?  Sepsis (HCC) ?  Pressure injury of skin ?  Pneumococcal bacteremia ?  Acute metabolic encephalopathy ?  Alteration in electrolyte and fluid balance ?  Hepatitis C ?  Liver mass ?  Anemia ?  NSTEMI (non-ST elevated myocardial infarction) (HCC) ?  Thrombocytopenia (HCC) ?  Protein-calorie malnutrition, severe ? ?Severe Sepsis 2/2 Strep Pneumo CAP + Bacteremia  ?Appreciate infectious disease recommendations ?Repeat cultures have been negative ?Patient remains off vasopressors, shock is resolved ?Patient is a still on antibiotic total daily 13, will defer to infectious disease regarding duration  of antibiotic therapy ?Currently on penicillin G for day 6 ?TEE is pending ? ?Acute Hypoxic and Hypercapnic Respiratory Failure 2/2 Community Acquired Pneumonia s/p trach ?Probable COPD ?Patient's x-ray chest showed some improvement in  bilateral infiltrates ?Placed on pressure support trial, he is tachypneic but generating good tidal volume ?He tolerated tracheostomy well yesterday ?He is off propofol currently on low-dose fentanyl ?Continue Klonopin, Seroquel and oxycodone prior to ?Wean PEEP/FiO2 ?Tolerating spontaneous breathing trial ?  ?Acute metabolic/septic encephalopathy ?Continuing to titrate sedation, , RASS goal 0/-1 ?No change in current Klonopin twice daily 1 mg ?Continue Seroquel, oxycodone ? ?Type 2 MI due to Demand Ischemia due to severe sepsis ?EKG reviewed with mild ST changes ?TTE with preserved EF ?Outpatient follow-up ?Once he stabilizes we will need to add beta-blockade, and statin ?Had recent episode of GI bleeding from lower esophagus, holding aspirin ? ?Thrombocytopenia, improving ?Acute blood loss anemia on top of critical illness ?Upper GI bleeding from lower esophagus and stomach ?Received total of 3 units of blood since 3/1, source of bleeding identified in the esophagus on EGD ?Continue PPI every 12 ?Platelets are trending up now, as sepsis improves ? ?Acute Urinary Retention ?Continue every 8 hour bladder scans ?In-N-Out catheters for volume greater than 300 ? 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 PSA minimally elevated so low suspicion for that being primary. AFP negative. Discussed pursuing IR biopsy in the future with family but given critical illness, patient is not currently stable enough. ?Suspect this is chronic given last IVDA occurred over 30 years ago. Viral load elevated at 27, 200,000.  ?Re-eval when more stable  ?Family to be counciled for screening  ? ?Severe protein calorie malnutrition ?Restarted back on tube feeds ?Nutritionist following ? ?Best Practice (right click and "Reselect all SmartList Selections" daily)   ? ?Diet/type: NPO.  Tube Feeds ?DVT prophylaxis: SCD ?GI prophylaxis: PPI ?Lines: Discontinue central line and art line ?Foley:  N/A ?Code Status:  full code ?Last date of multidisciplinary goals of care discussion [Wife and daughter updated at bedside on 2/28, decision was to continue full scope of care, explained that overall his prognosis is guarded] ? ? ? ?Total critical care time: 33 minutes ? ?Performed by: Cheri Fowler ?  ?Critical care time was exclusive of separately billable procedures and treating other patients. ?  ?Critical care was necessary to treat or prevent imminent or life-threatening deterioration. ?  ?Critical care was time spent personally by me on the following activities: development of treatment plan with patient and/or surrogate as well as nursing, discussions with consultants, evaluation of patient's response to treatment, examination of patient, obtaining history from patient or surrogate, ordering and performing treatments and interventions, ordering and review of laboratory studies, ordering and review of radiographic studies, pulse oximetry and re-evaluation of patient's condition. ?  ?Cheri Fowler MD ?[facility] ?See Amion for pager ?If no response to pager, please call [phone] until 7pm ?After 7pm, Please call E-link [phone] ? ? ?

## 2021-10-04 ENCOUNTER — Encounter (HOSPITAL_COMMUNITY): Payer: No Typology Code available for payment source

## 2021-10-04 DIAGNOSIS — J9601 Acute respiratory failure with hypoxia: Secondary | ICD-10-CM

## 2021-10-04 DIAGNOSIS — J13 Pneumonia due to Streptococcus pneumoniae: Secondary | ICD-10-CM

## 2021-10-04 DIAGNOSIS — R64 Cachexia: Secondary | ICD-10-CM

## 2021-10-04 DIAGNOSIS — Z9911 Dependence on respirator [ventilator] status: Secondary | ICD-10-CM

## 2021-10-04 LAB — BASIC METABOLIC PANEL
Anion gap: 5 (ref 5–15)
BUN: 32 mg/dL — ABNORMAL HIGH (ref 8–23)
CO2: 27 mmol/L (ref 22–32)
Calcium: 7.4 mg/dL — ABNORMAL LOW (ref 8.9–10.3)
Chloride: 105 mmol/L (ref 98–111)
Creatinine, Ser: 0.68 mg/dL (ref 0.61–1.24)
GFR, Estimated: >60 mL/min (ref >60)
Glucose, Bld: 120 mg/dL — ABNORMAL HIGH (ref 70–99)
Potassium: 3.8 mmol/L (ref 3.5–5.1)
Sodium: 137 mmol/L (ref 135–145)

## 2021-10-04 LAB — CBC
HCT: 18.6 % — ABNORMAL LOW (ref 39.0–52.0)
HCT: 24 % — ABNORMAL LOW (ref 39.0–52.0)
Hemoglobin: 6.3 g/dL — CL (ref 13.0–17.0)
Hemoglobin: 8.6 g/dL — ABNORMAL LOW (ref 13.0–17.0)
MCH: 30.6 pg (ref 26.0–34.0)
MCH: 32 pg (ref 26.0–34.0)
MCHC: 33.9 g/dL (ref 30.0–36.0)
MCHC: 35.8 g/dL (ref 30.0–36.0)
MCV: 90.3 fL (ref 80.0–100.0)
MCV: 89.2 fL (ref 80.0–100.0)
Platelets: 166 10*3/uL (ref 150–400)
Platelets: 180 10*3/uL (ref 150–400)
RBC: 2.06 MIL/uL — ABNORMAL LOW (ref 4.22–5.81)
RBC: 2.69 MIL/uL — ABNORMAL LOW (ref 4.22–5.81)
RDW: 16.3 % — ABNORMAL HIGH (ref 11.5–15.5)
RDW: 15.8 % — ABNORMAL HIGH (ref 11.5–15.5)
WBC: 13.1 10*3/uL — ABNORMAL HIGH (ref 4.0–10.5)
WBC: 15.2 10*3/uL — ABNORMAL HIGH (ref 4.0–10.5)
nRBC: 0.3 % — ABNORMAL HIGH (ref 0.0–0.2)
nRBC: 0.3 % — ABNORMAL HIGH (ref 0.0–0.2)

## 2021-10-04 LAB — GLUCOSE, CAPILLARY
Glucose-Capillary: 114 mg/dL — ABNORMAL HIGH (ref 70–99)
Glucose-Capillary: 119 mg/dL — ABNORMAL HIGH (ref 70–99)
Glucose-Capillary: 121 mg/dL — ABNORMAL HIGH (ref 70–99)
Glucose-Capillary: 125 mg/dL — ABNORMAL HIGH (ref 70–99)
Glucose-Capillary: 125 mg/dL — ABNORMAL HIGH (ref 70–99)
Glucose-Capillary: 142 mg/dL — ABNORMAL HIGH (ref 70–99)

## 2021-10-04 LAB — TRIGLYCERIDES: Triglycerides: 243 mg/dL — ABNORMAL HIGH (ref <150)

## 2021-10-04 LAB — PREPARE RBC (CROSSMATCH)

## 2021-10-04 LAB — MAGNESIUM: Magnesium: 1.9 mg/dL (ref 1.7–2.4)

## 2021-10-04 MED ORDER — METOPROLOL TARTRATE 25 MG PO TABS
25.0000 mg | ORAL_TABLET | Freq: Two times a day (BID) | ORAL | Status: DC
Start: 2021-10-04 — End: 2021-10-04
  Administered 2021-10-04: 25 mg
  Filled 2021-10-04: qty 1

## 2021-10-04 MED ORDER — OXYCODONE HCL 5 MG PO TABS
5.0000 mg | ORAL_TABLET | Freq: Four times a day (QID) | ORAL | Status: DC | PRN
  Administered 2021-10-04 – 2021-10-05 (×2): 10 mg
  Administered 2021-10-08 – 2021-10-13 (×4): 5 mg
  Administered 2021-10-15 – 2021-10-16 (×7): 10 mg
  Administered 2021-10-17: 5 mg
  Administered 2021-10-17: 10 mg
  Administered 2021-10-17: 5 mg
  Administered 2021-10-17 – 2021-10-27 (×29): 10 mg
  Filled 2021-10-04: qty 1
  Filled 2021-10-04 (×5): qty 2
  Filled 2021-10-04: qty 1
  Filled 2021-10-04: qty 2
  Filled 2021-10-04: qty 1
  Filled 2021-10-04 (×2): qty 2
  Filled 2021-10-04: qty 1
  Filled 2021-10-04 (×25): qty 2
  Filled 2021-10-04: qty 1
  Filled 2021-10-04 (×7): qty 2
  Filled 2021-10-04: qty 1
  Filled 2021-10-04 (×2): qty 2

## 2021-10-04 MED ORDER — SODIUM CHLORIDE 0.9% IV SOLUTION: Freq: Once | INTRAVENOUS | Status: AC

## 2021-10-04 MED ORDER — HYDRALAZINE HCL 20 MG/ML IJ SOLN
10.0000 mg | INTRAMUSCULAR | Status: DC | PRN
  Administered 2021-10-05: 10 mg via INTRAVENOUS
  Filled 2021-10-04: qty 1

## 2021-10-04 MED ORDER — ATORVASTATIN CALCIUM 40 MG PO TABS
40.0000 mg | ORAL_TABLET | Freq: Every day | ORAL | Status: DC
  Administered 2021-10-04 – 2021-10-29 (×25): 40 mg
  Filled 2021-10-04 (×27): qty 1

## 2021-10-04 MED ORDER — HYDRALAZINE HCL 20 MG/ML IJ SOLN
10.0000 mg | Freq: Four times a day (QID) | INTRAMUSCULAR | Status: DC | PRN
  Administered 2021-10-04: 10 mg via INTRAVENOUS
  Filled 2021-10-04: qty 1

## 2021-10-04 MED ORDER — FENTANYL CITRATE (PF) 100 MCG/2ML IJ SOLN: 25.0000 ug | INTRAMUSCULAR | Status: DC | PRN

## 2021-10-04 MED ORDER — METOPROLOL TARTRATE 50 MG PO TABS
50.0000 mg | ORAL_TABLET | Freq: Two times a day (BID) | ORAL | Status: DC
  Administered 2021-10-04 – 2021-10-09 (×11): 50 mg
  Filled 2021-10-04 (×11): qty 1

## 2021-10-04 NOTE — Progress Notes (Addendum)
? ?NAME:  Troy Sampson, MRN:  [id number], DOB:  10/11/1955, LOS: 12 ?ADMISSION DATE:  09/21/2021, CONSULTATION DATE:  09/22/2021 ?REFERRING MD:  [facility], CHIEF COMPLAINT:  Shortness of Breath  ? ?History of Present Illness:  ?66 y/o male with a PMHx of duodenal ulcer, IDA, nephrolithiasis and a current smoker who presented from [facility] with c/o SOB x 1 week. History obtained by his wife due to patient arrived obtunded, markedly hypoxic oxygen sats in the low 80s. Patient placed on BiPAP and given Ativan. He was fairly tachypnis with RR in 40s as well as tachycardia and was ultimately intubated for acute hypoxic respiratory failure and increased work of breathing. Noted to have a fever of 101.8. CXR show a LLL pneumonia. PCCM consulted for ICU admission.  ? 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 ?Significant Hospital Events: ?Including procedures, antibiotic start and stop dates in addition to other pertinent events   ?2/22: Admitted overnight to ICU. Blood cultures 3/4 positive for strep pneumo. Strep pneumo antigen positive, TTE with EF 50 to 55% no wall motion abnormalities ?2/26: New onset fevers. Antibiotics broadened to Cefepime and Vancomycin. Propofol switched to Precedex.  ?2/27: Bradycardia noted on Precedex with increased tachypnea.  ?2/28, antibiotics changed to penicillin G per infectious disease recommendation ?3/1: Vasoactive drip requirements improving, received 1 unit of blood for hemoglobin of 6.1 ?3/2 follow-up hemoglobin 6.8, received second unit of blood, has also received 3 units of platelets since admission.  Off norepinephrine  ?3/3 EGD> grade D esophagitis with bleeding ?3/4 tracheostomy ?3/7 ongoing tachypnea limiting SBT. Unable to SBT at all this morning.  ? ?Interim History / Subjective:  ?C/o dyspnea, denies pain.  ? ?Objective   ?Blood pressure (!) 181/82, pulse 97, temperature 97.9 ?F (36.6 ?C),  temperature source Axillary, resp. rate (!) 34, height 5\' 7"  (1.702 m), weight 56.2 kg, SpO2 96 %. ?   ?Vent Mode: PRVC ?FiO2 (%):  [40 %] 40 % ?Set Rate:  [24 bmp-35 bmp] 35 bmp ?Vt Set:  [500 mL] 500 mL ?PEEP:  [8 cmH20] 8 cmH20 ?Pressure Support:  [8 cmH20] 8 cmH20 ?Plateau Pressure:  [16 cmH20-24 cmH20] 20 cmH20  ? ?Intake/Output Summary (Last 24 hours) at 10/04/2021 1125 ?Last data filed at 10/04/2021 1047 ?Gross per 24 hour  ?Intake 2986.41 ml  ?Output 2200 ml  ?Net 786.41 ml  ? ?Filed Weights  ? 09/29/21 0500 09/30/21 0500 10/04/21 0500  ?Weight: 57.3 kg 57.4 kg 56.2 kg  ? ? ?Examination: ?General: Frail elderly appearing gentleman in mild respiratory distress ?HENT: [place]/AT, PERRL, no JVD ?Lungs: Coarse crackles throughout all anterior lung fields.  ?Cardiovascular: Sinus arrhythmia in the 90s. No MRG ?Abdomen: Soft, non-tender, non-distended ?Extremities: No acute deformity.  ?Neuro: Awake, alert, responds yes no ? ?Resolved Hospital Problem list   ?Septic shock ?Lactic acidosis ?Acute urinary retention ?Thrombocytopenia ?Type 2 MI ? ?Assessment & Plan:  ? ?Severe sepsis 2/2 Strep pneumo CAP + bacteremia; blood cultures cleared with treatment.  ?- Per ID recommendation continue penicillin for another 2 days. Stop on 10/06/2021 ?- TEE ordered ? ?Acute hypoxic and hypercapnic respiratory failure 2/2 Community acquired pneumonia s/p trach ?Probable COPD ?Tracheostomy dependent for prolonged respiratory failure ?- Continue full vent support ?- Wean as tolerated. Currently tachypnea is limiting weaning.  ?- Pen G ongoing although course for pneumonia has been completed.  ?- ? Pulmonary edema, will diurese today. Lasix 60mg  BID  ?-  PRN duoneb ?- Aspiration precautions ?- Routine trach care ? ?Acute metabolic encephalopathy due to sepsis ?-sedative drips off ?-Continue seroquel and oxycodone ?  ?Hypertension:  ?-can start low dose metoprolol and statin now that hypertensive ?-holding aspirin due to GIB, esophagitis ?    ?Acute Urinary Retention, resolved ?-external catheter ?-bladder scans PRN ? ?Acute blood loss anemia on top of critical illness ?Upper GI bleeding from esophagitis, grade D ?- continued melena. ?- Hold heparin SQ. SCDs for now.  ?  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 PSA minimally elevated so low suspicion for that being primary. AFP negative. Discussed pursuing IR biopsy in the future with family but given critical illness, patient is not currently stable enough. Suspect this is chronic given last IVDA occurred over 30 years ago. Viral load elevated at 27, 200,000.  ?-can defer biopsy until more stable ?-family should be screened for HCV ? ?Severe protein calorie malnutrition, cachexia ?-TF + Creon ?-vitamins ?  ?History of tobacco abuse ?-nicotine patch ?-can counsel on the importance of quitting when appropriate ?  ?Arm edema: dopplers largely negative but unable to visualize bilateral basilic and cephalic veins. More likely represents anasarca.  ?- diuresis as above ?- defer anticoagulation.  ? ? ?Best Practice (right click and "Reselect all SmartList Selections" daily)  ? ?Diet/type: tubefeeds ?DVT prophylaxis: prophylactic heparin  ?GI prophylaxis: PPI ?Lines: N/A ?Foley:  N/A ?Code Status:  full code ?Last date of multidisciplinary goals of care discussion [ Daughter updated] ? ? ?Critical care time: ?  ? ? ?Joneen Roach, AGACNP-BC ?[facility] ? ?See Amion for personal pager ?PCCM on call pager [phone] until 7pm. ?Please call Elink 7p-7a. [phone] ? ?10/05/2021 1:17 PM ? ? ? ? ? ? ? ?

## 2021-10-04 NOTE — Progress Notes (Signed)
[facility] ?Progress Note ? ?NAME:  Troy Sampson, MRN:  [id number], DOB:  Jun 02, 1956, LOS: 12 ?ADMISSION DATE:  09/21/2021, CONSULTATION DATE:  09/22/2021 ?REFERRING MD: Dr. Fredderick Sampson CHIEF COMPLAINT:  SOB  ? ?History of Present Illness:  ?[name] is a 66 y/o male with a PMHx of duodenal ulcer, IDA, nephrolithiasis who presented to the ED with SOB. On arrival, he was obtained 2/2 severe hypoxia and subsequently intubated emergently.  ? ?Per family and chart review, patient is a smoker but no know history of COPD. No past medical history of alcohol abuse.  ? ?Pertinent  Medical History  ?Duodenal ulcer with IDA ? ?Significant Hospital Events: ?Including procedures, antibiotic start and stop dates in addition to other pertinent events   ?2/22: Admitted overnight to ICU. Blood cultures 3/4 positive for strep pneumo. Strep pneumo antigen positive, TTE with EF 50 to 55% no wall motion abnormalities ?2/26: New onset fevers. Antibiotics broadened to Cefepime and Vancomycin. Propofol switched to Precedex.  ?2/27: Bradycardia noted on Precedex with increased tachypnea.  ?2/28, antibiotics changed to penicillin G per infectious disease recommendation ?3/1: Vasoactive drip requirements improving, received 1 unit of blood for hemoglobin of 6.1 ?3/2 follow-up hemoglobin 6.8, received second unit of blood, has also received 3 units of platelets since admission.  Off norepinephrine  ?3/3 EGD> grade D esophagitis with bleeding ?3/4 tracheostomy ? ?Interim History / Subjective:  ?Trach 2 days ago.  1 unit pRBCs overnight. Melena again this morning. Cortrak clogged and had to be removed yesterday. Fentanyl resumed overnight for pain.  ? ?Objective   ?Blood pressure (!) 164/83, pulse 99, temperature 97.8 ?F (36.6 ?C), temperature source Axillary, resp. rate (!) 34, height 5\' 7"  (1.702 m), weight 56.2 kg, SpO2 98 %. ?   ?Vent Mode: PRVC ?FiO2 (%):  [40 %] 40 % ?Set Rate:  [24 bmp-35 bmp] 35 bmp ?Vt Set:  [500 mL]  500 mL ?PEEP:  [8 cmH20] 8 cmH20 ?Pressure Support:  [8 cmH20] 8 cmH20 ?Plateau Pressure:  [16 cmH20-24 cmH20] 20 cmH20  ? ?Intake/Output Summary (Last 24 hours) at 10/04/2021 0808 ?Last data filed at 10/04/2021 0757 ?Gross per 24 hour  ?Intake 2546.03 ml  ?Output 1600 ml  ?Net 946.03 ml  ? ? ?Filed Weights  ? 09/29/21 0500 09/30/21 0500 10/04/21 0500  ?Weight: 57.3 kg 57.4 kg 56.2 kg  ? ?Examination:  ?Physical exam: ?General: critically ill appearing man, cachectic, lying in bed in NAD. Trached and on MV. ?HEENT: Sikes/AT, eyes anicteric. Poor dentition. ?Neuro: awake, eyes open, tracking but not significantly interactive. ?Resp: mild rhonchi, minimal ETT secretions. Tachypneic but no accessory muscle use. Maintaining Vt ~500cc on PS. ?Heart: S1S2, tachycardic, reg rhythm. ?Abdomen: soft, NT, ND ?Skin: Warm, dry, no rashes ?Extremities: UE edema, no LE edema.  ? ?WBC 13.1 ?H/H 6.3/18.6 ?Platelets 166 ?BUN 32 ?Cr 0.68 ? ? ?Resolved Hospital Problem list   ?Lactic Acidosis  ?Septic shock ?Hypernatremia/hyperkalemia hyperchloremia/hypophosphatemia ? ?Assessment & Plan:  ? ?Principal Problem: ?  Pneumonia, pneumococcal (HCC) ?Active Problems: ?  Acute respiratory failure with hypoxia (HCC) ?  Sepsis (HCC) ?  Pressure injury of skin ?  Pneumococcal bacteremia ?  Acute metabolic encephalopathy ?  Alteration in electrolyte and fluid balance ?  Hepatitis C ?  Liver mass ?  Anemia ?  NSTEMI (non-ST elevated myocardial infarction) (HCC) ?  Thrombocytopenia (HCC) ?  Protein-calorie malnutrition, severe ? ?Severe sepsis 2/2 Strep pneumo CAP + bacteremia; blood cultures cleared with treatment. Septic shock  resolved. ?-Appreciate infectious disease recommendations- planning for prolonged course of therapy given high disease burden. Con't PCN G. ?-TEE ordered.  ? ?Acute hypoxic and hypercapnic respiratory failure 2/2 Community acquired pneumonia s/p trach ?Probable COPD ?Tracheostomy dependent for prolonged respiratory  failure ?-LTVV ?-con't vent weaning efforts; ok to tolerate tachypnea with ok tidal volumes given the ongoing tachypnea while he is on full support ?-VAP prevention protocol ?-PAD protocol for sedation; transitioning to enterals ?-routine trach care ?-PCN G per ID ?  ?Acute metabolic encephalopathy due to sepsis ?-PAD protocol ?-limiting sedation as able ?-ensure adequate pain control ?-Seroquel  ? ?Type 2 MI due to demand ischemia due to severe sepsis, no decrease in EF ?-can start low dose metoprolol and statin now that hypertensive ?-holding aspirin due to GIB, esophagitis ? ?Thrombocytopenia, resolved ?Acute blood loss anemia on top of critical illness ?Upper GI bleeding from esophagitis, grade D ?-con't PPI BID, avoid aspirin ? ?Acute Urinary Retention, resolved ?-con't primofit ?-can make bladder scans PRN ? 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 PSA minimally elevated so low suspicion for that being primary. AFP negative. Discussed pursuing IR biopsy in the future with family but given critical illness, patient is not currently stable enough. Suspect this is chronic given last IVDA occurred over 30 years ago. Viral load elevated at 27, 200,000.  ?-can defer biopsy until more stable ?-family should be screened for HCV ? ?Severe protein calorie malnutrition, cachexia ?-TF + Creon ?-vitamins ? ?History of tobacco abuse ?-nicotine patch ?-can counsel on the importance of quitting when appropriate ? ?Arm edema ?-US dopplers ? ?Remove CVC today. ? ?Best Practice (right click and "Reselect all SmartList Selections" daily)  ? ?Diet/type: NPO.  Tube Feeds ?DVT prophylaxis: SCD ?GI prophylaxis: PPI ?Lines: Discontinue central line - removing today ?Foley:  N/A ?Code Status:  full code ?Last date of multidisciplinary goals of care discussion  [Wife and daughter updated at bedside on 2/28, decision was to continue full scope of care, explained that overall his prognosis is guarded] ? ?This patient is critically ill with multiple organ system failure which requires frequent high complexity decision making, assessment, support, evaluation, and titration of therapies. This was completed through the application of advanced monitoring technologies and extensive interpretation of multiple databases. During this encounter critical care time was devoted to patient care services described in this note for 35 minutes. ? ?Steffanie Dunn, DO 10/04/21 8:53 AM ?[facility] ? ?

## 2021-10-04 NOTE — Progress Notes (Signed)
?  [facility] ? ? ?Reason for visit: Follow up on pneumonia  ? ?Interval History: s/p tracheostomy placement; Tmax 101.7; WBC trending down to 13.1.  Awake and following commands  ?Day 12 antibiotics ? ?Physical Exam: ?Constitutional:  ?Vitals:  ? 10/04/21 0840 10/04/21 0910  ?BP:  (!) 181/82  ?Pulse:  97  ?Resp:    ?Temp:  97.9 ?F (36.6 ?C)  ?SpO2: 96%   ? patient appears in NAD ?HENT: + trach ?Respiratory: Normal respiratory effort; CTA B ? ? ?Review of Systems: ?Unable to be assessed due to patient factors ? ?Lab Results  ?Component Value Date  ? WBC 13.1 (H) 10/04/2021  ? HGB 6.3 (LL) 10/04/2021  ? HCT 18.6 (L) 10/04/2021  ? MCV 90.3 10/04/2021  ? PLT 166 10/04/2021  ?  ?Lab Results  ?Component Value Date  ? CREATININE 0.68 10/04/2021  ? BUN 32 (H) 10/04/2021  ? NA 137 10/04/2021  ? K 3.8 10/04/2021  ? CL 105 10/04/2021  ? CO2 27 10/04/2021  ?  ?Lab Results  ?Component Value Date  ? ALT 84 (H) 10/02/2021  ? AST 131 (H) 10/02/2021  ? ALKPHOS 180 (H) 10/02/2021  ?  ? ?Microbiology: ?Recent Results (from the past 240 hour(s))  ?Culture, blood (routine x 2)     Status: None  ? Collection Time: 09/25/21  9:33 AM  ? Specimen: BLOOD  ?Result Value Ref Range Status  ? Specimen Description BLOOD SITE NOT SPECIFIED  Final  ? Special Requests   Final  ?  BOTTLES DRAWN AEROBIC ONLY Blood Culture results may not be optimal due to an inadequate volume of blood received in culture bottles  ? Culture   Final  ?  NO GROWTH 5 DAYS ?Performed at [facility], [address], [place], [place] [address] ?  ? Report Status 09/30/2021 FINAL  Final  ?Culture, blood (routine x 2)     Status: None  ? Collection Time: 09/25/21  9:33 AM  ? Specimen: BLOOD  ?Result Value Ref Range Status  ? Specimen Description BLOOD SITE NOT SPECIFIED  Final  ? Special Requests   Final  ?  BOTTLES DRAWN AEROBIC ONLY Blood Culture results may not be optimal due to an inadequate volume of blood received in culture bottles   ? Culture   Final  ?  NO GROWTH 5 DAYS ?Performed at [facility], [address] [place], [place] [address] ?  ? Report Status 09/30/2021 FINAL  Final  ?Culture, Respiratory w Gram Stain     Status: None  ? Collection Time: 09/26/21 10:30 AM  ? Specimen: Tracheal Aspirate; Respiratory  ?Result Value Ref Range Status  ? Specimen Description TRACHEAL ASPIRATE  Final  ? Special Requests NONE  Final  ? Gram Stain   Final  ?  FEW WBC PRESENT,BOTH PMN AND MONONUCLEAR ?RARE GRAM POSITIVE COCCOBACILLUS ?  ? Culture   Final  ?  RARE Normal respiratory flora-no Staph aureus or Pseudomonas seen ?Performed at [facility], [address] [place], [place] [address] ?  ? Report Status 09/28/2021 FINAL  Final  ? ? ?Impression/Plan:  ?1. Pneumococcal pneumonia with sepsis - severe and required intubation and has remained on penicillin.  Overall improved now though remains on the vent.  He has now been on penicillin for 12 days and likely cured of the infection.  I will have him continue though with the penicillin for another 2 days then stop  at that time.   ? ?2.  Fever - one fever over the weekend following trach placement.  No clinical changes of concern and I suspect related to his recent procedure.  Will continue to monitor.  ? ?3.  Acute respiratory failure - secondary to pneumonia and now s/p tracheostomy.  Per CCM.   ? ?4.  Acute metabolic encephalopathy - due to his infection/sepsis and now improving, following commands.   ? ?Stop date placed for penicillin ?I will sign off, call with questions ? ?  ?

## 2021-10-04 NOTE — Progress Notes (Signed)
eLink Physician-Brief Progress Note ?Patient Name: Troy Sampson ?DOB: [birth year] ?MRN: [id number] ? ? ?Date of Service ? 10/04/2021  ?HPI/Events of Note ? Multiple issues: 1. Agitation/Pain - RR = 35-40 and 2. Hypertension - BP = 171/89.  ?eICU Interventions ? Plan: ?Fentanyl 25-50 mcg IV Q 3 hours PRN pain or agitation.  ?Increase Hydralazine to 10 mg IV Q 4 hours PRN SBP > 160.  ? ? ? ?Intervention Category ?Major Interventions: Hypertension - evaluation and management;Delirium, psychosis, severe agitation - evaluation and management ? ?Emmaleah Meroney Dennard Nip ?10/04/2021, 9:23 PM ?

## 2021-10-04 NOTE — TOC Progression Note (Signed)
Transition of Care (TOC) - Progression Note  ? ? ?Patient Details  ?Name: Troy Sampson ?MRN: [id number] ?Date of Birth: 01-Feb-1956 ? ?Transition of Care (TOC) CM/SW Contact  ?Tom-Johnson, Hershal Coria, RN ?Phone Number: ?10/04/2021, 4:23 PM ? ?Clinical Narrative:    ? ?Patient had tracheostomy placed on 03/04. Continues on vent. ID following for PNA. Hgb this morning was 6.3. 1 unit PRBC given. Now hgb at 8.6. No recommendations or needs noted at this time. CM will continue to follow with needs. ? ?Expected Discharge Plan: Long Term Nursing Home ?  ? ?Expected Discharge Plan and Services ?Expected Discharge Plan: Long Term Nursing Home ?  ?  ?  ?  ?                ?  ?  ?  ?  ?  ?  ?  ?  ?  ?  ? ? ?Social Determinants of Health (SDOH) Interventions ?  ? ?Readmission Risk Interventions ?No flowsheet data found. ? ?

## 2021-10-04 NOTE — Progress Notes (Signed)
Updated daughter and wife at bedside. All questions were answered.  ? ?Julian Hy, DO 10/04/21 4:24 PM ?[facility] ? ?

## 2021-10-04 NOTE — Progress Notes (Addendum)
Patient had melena BM at 0530. MD made aware with no further concerns.  ? ?

## 2021-10-04 NOTE — Progress Notes (Signed)
Upon walking in room, noted new/fresh blood in trach.  This was discussed w/ RN and MD.  Per MD, she just suctioned pt recently.  Pt w/ RR 40-50, increased WOB noted, pt appears uncomfortable.  Pt placed back on full vent support.  RN aware.   ?

## 2021-10-04 NOTE — Progress Notes (Signed)
eLink Physician-Brief Progress Note ?Patient Name: Troy Sampson ?DOB: [birth year] ?MRN: [id number] ? ? ?Date of Service ? 10/04/2021  ?HPI/Events of Note ? CBC - Hemoglobin 6.3 and platelets are ok. Has needed intermittent blood transfusion.   ?eICU Interventions ? 1 unit RBC and type/screen ordered ?Post transfusion H/H ordered ?RN to verify consent  ? ? ? ?Intervention Category ?Intermediate Interventions: Diagnostic test evaluation ? ?[name] G [name] ?10/04/2021, 5:12 AM ?

## 2021-10-05 ENCOUNTER — Inpatient Hospital Stay (HOSPITAL_COMMUNITY): Payer: Medicare Other

## 2021-10-05 DIAGNOSIS — Z930 Tracheostomy status: Secondary | ICD-10-CM

## 2021-10-05 LAB — BPAM RBC
Blood Product Expiration Date: 202303212359
ISSUE DATE / TIME: 202303060600
Unit Type and Rh: 7300

## 2021-10-05 LAB — CBC WITH DIFFERENTIAL/PLATELET
Abs Immature Granulocytes: 0.26 10*3/uL — ABNORMAL HIGH (ref 0.00–0.07)
Basophils Absolute: 0 10*3/uL (ref 0.0–0.1)
Basophils Relative: 0 %
Eosinophils Absolute: 0 10*3/uL (ref 0.0–0.5)
Eosinophils Relative: 0 %
HCT: 24.6 % — ABNORMAL LOW (ref 39.0–52.0)
Hemoglobin: 8.5 g/dL — ABNORMAL LOW (ref 13.0–17.0)
Immature Granulocytes: 2 %
Lymphocytes Relative: 2 %
Lymphs Abs: 0.4 10*3/uL — ABNORMAL LOW (ref 0.7–4.0)
MCH: 30.8 pg (ref 26.0–34.0)
MCHC: 34.6 g/dL (ref 30.0–36.0)
MCV: 89.1 fL (ref 80.0–100.0)
Monocytes Absolute: 0.7 10*3/uL (ref 0.1–1.0)
Monocytes Relative: 4 %
Neutro Abs: 16.4 10*3/uL — ABNORMAL HIGH (ref 1.7–7.7)
Neutrophils Relative %: 92 %
Platelets: 217 10*3/uL (ref 150–400)
RBC: 2.76 MIL/uL — ABNORMAL LOW (ref 4.22–5.81)
RDW: 16.1 % — ABNORMAL HIGH (ref 11.5–15.5)
WBC: 17.8 10*3/uL — ABNORMAL HIGH (ref 4.0–10.5)
nRBC: 0.2 % (ref 0.0–0.2)

## 2021-10-05 LAB — TYPE AND SCREEN
ABO/RH(D): B POS
Antibody Screen: NEGATIVE
Unit division: 0

## 2021-10-05 LAB — BASIC METABOLIC PANEL
Anion gap: 9 (ref 5–15)
BUN: 30 mg/dL — ABNORMAL HIGH (ref 8–23)
CO2: 25 mmol/L (ref 22–32)
Calcium: 7.8 mg/dL — ABNORMAL LOW (ref 8.9–10.3)
Chloride: 103 mmol/L (ref 98–111)
Creatinine, Ser: 0.63 mg/dL (ref 0.61–1.24)
GFR, Estimated: >60 mL/min (ref >60)
Glucose, Bld: 124 mg/dL — ABNORMAL HIGH (ref 70–99)
Potassium: 3.7 mmol/L (ref 3.5–5.1)
Sodium: 137 mmol/L (ref 135–145)

## 2021-10-05 LAB — GLUCOSE, CAPILLARY
Glucose-Capillary: 113 mg/dL — ABNORMAL HIGH (ref 70–99)
Glucose-Capillary: 126 mg/dL — ABNORMAL HIGH (ref 70–99)
Glucose-Capillary: 125 mg/dL — ABNORMAL HIGH (ref 70–99)
Glucose-Capillary: 103 mg/dL — ABNORMAL HIGH (ref 70–99)
Glucose-Capillary: 156 mg/dL — ABNORMAL HIGH (ref 70–99)
Glucose-Capillary: 121 mg/dL — ABNORMAL HIGH (ref 70–99)

## 2021-10-05 LAB — MAGNESIUM: Magnesium: 1.9 mg/dL (ref 1.7–2.4)

## 2021-10-05 MED ORDER — MAGNESIUM SULFATE 4 GM/100ML IV SOLN
4.0000 g | Freq: Once | INTRAVENOUS | Status: AC
  Administered 2021-10-05: 4 g via INTRAVENOUS
  Filled 2021-10-05: qty 100

## 2021-10-05 MED ORDER — AMLODIPINE BESYLATE 5 MG PO TABS
5.0000 mg | ORAL_TABLET | Freq: Every day | ORAL | Status: DC
  Administered 2021-10-05: 5 mg
  Filled 2021-10-05: qty 1

## 2021-10-05 MED ORDER — FUROSEMIDE 10 MG/ML IJ SOLN
60.0000 mg | Freq: Two times a day (BID) | INTRAMUSCULAR | Status: DC
  Administered 2021-10-05 – 2021-10-07 (×4): 60 mg via INTRAVENOUS
  Filled 2021-10-05 (×4): qty 6

## 2021-10-05 MED ORDER — POTASSIUM CHLORIDE 20 MEQ PO PACK
40.0000 meq | PACK | Freq: Two times a day (BID) | ORAL | Status: AC
  Administered 2021-10-05 (×2): 40 meq
  Filled 2021-10-05 (×2): qty 2

## 2021-10-05 MED ORDER — PROSOURCE TF PO LIQD
45.0000 mL | Freq: Two times a day (BID) | ORAL | Status: DC
  Administered 2021-10-05 – 2021-10-29 (×47): 45 mL
  Filled 2021-10-05 (×47): qty 45

## 2021-10-05 MED ORDER — NICOTINE 7 MG/24HR TD PT24
7.0000 mg | MEDICATED_PATCH | Freq: Every day | TRANSDERMAL | Status: AC
  Administered 2021-10-05 – 2021-10-11 (×7): 7 mg via TRANSDERMAL
  Filled 2021-10-05 (×7): qty 1

## 2021-10-05 MED ORDER — JUVEN PO PACK
1.0000 | PACK | Freq: Two times a day (BID) | ORAL | Status: DC
  Administered 2021-10-05 – 2021-10-12 (×14): 1
  Filled 2021-10-05 (×14): qty 1

## 2021-10-05 NOTE — Progress Notes (Addendum)
Nutrition Follow-up ? ?DOCUMENTATION CODES:  ? ?Severe malnutrition in context of chronic illness ? ?INTERVENTION:  ? ?Continue tube feeds via Cortrak: ?- Vital 1.5 @ 50 ml/hr (1200 ml/day) ?- Increase ProSource TF 45 ml to BID ? ?Tube feeding regimen provides 1880 kcal, 103 grams of protein, and 912 ml of H2O. ? ?- Continue MVI with minerals daily per tube ? ?- Add 1 packet Juven BID per tube, each packet provides 95 calories, 2.5 grams of protein, and 9.8 grams of carbohydrate; also contains L-arginine and L-glutamine, vitamin C, vitamin E, vitamin B-12, zinc, calcium, and calcium Beta-hydroxy-Beta-methylbutyrate to support wound healing ? ?NUTRITION DIAGNOSIS:  ? ?Severe Malnutrition related to chronic illness as evidenced by severe muscle depletion, severe fat depletion. ? ?Ongoing, being addressed via TF ? ?GOAL:  ? ?Patient will meet greater than or equal to 90% of their needs ? ?Met via TF ? ?MONITOR:  ? ?Vent status, Labs, Weight trends, TF tolerance, Skin, I & O's ? ?REASON FOR ASSESSMENT:  ? ?Consult, Ventilator ?Enteral/tube feeding initiation and management ? ?ASSESSMENT:  ? ?Pt with active tobacco dependence and PMH of duodenal ulcer, IDA, and nephrolithiasis admitted with severe acute hypoxic and hypercapnic respiratory failure due to CAP. ? ?02/27 - Cortrak placed (tip gastric, near pylorus) ?03/02 - pt with 2 episodes of emesis ?03/03 - s/p EGD showing friable esophageal mucosa with active signs of bleeding from gastric fundus and esophagus ?03/04 - s/p tracheostomy ?03/05 - Cortrak clogged, NG tube placed ?03/07 - NG tube removed, Cortrak started working again (tip in third portion of duodenum) ? ?Discussed pt with RN and during ICU rounds. Per notes, pt with history of EtOH abuse and previous history of IVDA when pt was in the [employer] per daughter's reports. Pt now with diagnosis of hepatitis C. ? ?Per RN, pt was tolerating tube feeds at 40 ml/hr without issue. Tube feeds were increased to goal  rate of 50 ml/hr this AM. Cortrak functioning appropriately and NG tube was removed yesterday. Per most recent abdominal x-ray on 3/05, tip of Cortrak is in the third portion of the duodenum. Tube appears to have self-advanced. ? ?Pt with new stage 2 pressure injury to sacrum documented on 09/29/21. RD to increase protein in tube feeding regimen and add Juven to support wound healing. ? ?Current TF: Vital 1.5 @ 50 ml/hr, ProSource TF 45 ml daily ? ?Admit weight: 46.3 kg ?Current weight: 58.1 kg ? ?Weight up ~12 kg compared to admit weight. Pt with moderate pitting edema to BUE. Suspect a large portion of weight gain can be attributed to edema and positive fluid balance. Pt is net positive 17.4 L since admit. ? ?Patient is on ventilator support via trach ?MV: 12.2 L/min ?Temp (24hrs), Avg:98.6 ?F (37 ?C), Min:98.3 ?F (36.8 ?C), Max:98.9 ?F (37.2 ?C) ? ?Medications reviewed and include: folic acid, SSI q 4 hours, MVI with minerals daily, IV protonix, IV thiamine, IV abx, klor-con 40 mEq x 2, IV magnesium sulfate 4 grams once ? ?Vitamin/Mineral Profile: ?Vitamin B12: 707 (WNL) ?Folate B9: 5.4 (low) ?Ferritin: 674 (high) ? ?Labs reviewed: BUN 30, WBC 17.8, hemoglobin 8.5 ?CBG's: 113-142 x 24 hours ? ?UOP: 800 ml x 24 hours ?I/O's: +17.4 L since admit ? ?Diet Order:   ?Diet Order   ? ?       ?  Diet NPO time specified  Diet effective now       ?  ? ?  ?  ? ?  ? ? ?EDUCATION  NEEDS:  ? ?Not appropriate for education at this time ? ?Skin:  Skin Assessment: ?Skin Integrity Issues: ?Stage II: sacrum ? ?Last BM:  10/04/21 type 6 ? ?Height:  ? ?Ht Readings from Last 1 Encounters:  ?09/22/21 5' 7"  (1.702 m)  ? ? ?Weight:  ? ?Wt Readings from Last 1 Encounters:  ?10/05/21 58.1 kg  ? ? ?BMI:  Body mass index is 20.06 kg/m?. ? ?Estimated Nutritional Needs:  ? ?Kcal:  1800-2000 ? ?Protein:  90-105 grams ? ?Fluid:  >1.8 L ? ? ? ?Gustavus Bryant, MS, RD, LDN ?Inpatient Clinical Dietitian ?Please see AMiON for contact information. ? ?

## 2021-10-05 NOTE — Progress Notes (Signed)
Patient  temporary NG tube removed at 2100 due to original cortrak becoming patent again. Approved by E link MD Dr. Arsenio Loader. No further concerns. ?

## 2021-10-05 NOTE — Progress Notes (Signed)
eLink Physician-Brief Progress Note ?Patient Name: Troy Sampson ?DOB: [birth year] ?MRN: [id number] ? ? ?Date of Service ? 10/05/2021  ?HPI/Events of Note ? Nursing request for AM lab orders.   ?eICU Interventions ? Plan: ?CBC with platelets, BMP and Mg++ level at 6 AM.   ? ? ? ?Intervention Category ?Major Interventions: Other: ? ?Margit Batte Dennard Nip ?10/05/2021, 5:33 AM ?

## 2021-10-05 NOTE — Progress Notes (Signed)
SLP Cancellation Note ? ?Patient Details ?Name: Troy Sampson ?MRN: [id number] ?DOB: 04-Mar-1956 ? ? ?Cancelled treatment:       Reason Eval/Treat Not Completed: Other (comment). Awaiting ATC tolerance ? ? ?Michaelangelo Mittelman, Riley Nearing ?10/05/2021, 9:17 AM ?

## 2021-10-06 ENCOUNTER — Inpatient Hospital Stay (HOSPITAL_COMMUNITY): Payer: Medicare Other

## 2021-10-06 DIAGNOSIS — Z930 Tracheostomy status: Secondary | ICD-10-CM

## 2021-10-06 LAB — POCT I-STAT 7, (LYTES, BLD GAS, ICA,H+H)
Acid-Base Excess: 6 mmol/L — ABNORMAL HIGH (ref 0.0–2.0)
Bicarbonate: 29.1 mmol/L — ABNORMAL HIGH (ref 20.0–28.0)
Calcium, Ion: 1.22 mmol/L (ref 1.15–1.40)
HCT: 25 % — ABNORMAL LOW (ref 39.0–52.0)
Hemoglobin: 8.5 g/dL — ABNORMAL LOW (ref 13.0–17.0)
O2 Saturation: 91 %
Patient temperature: 98.5
Potassium: 4.4 mmol/L (ref 3.5–5.1)
Sodium: 144 mmol/L (ref 135–145)
TCO2: 30 mmol/L (ref 22–32)
pCO2 arterial: 37.5 mmHg (ref 32–48)
pH, Arterial: 7.498 — ABNORMAL HIGH (ref 7.35–7.45)
pO2, Arterial: 56 mmHg — ABNORMAL LOW (ref 83–108)

## 2021-10-06 LAB — GLUCOSE, CAPILLARY
Glucose-Capillary: 84 mg/dL (ref 70–99)
Glucose-Capillary: 134 mg/dL — ABNORMAL HIGH (ref 70–99)
Glucose-Capillary: 132 mg/dL — ABNORMAL HIGH (ref 70–99)
Glucose-Capillary: 152 mg/dL — ABNORMAL HIGH (ref 70–99)
Glucose-Capillary: 136 mg/dL — ABNORMAL HIGH (ref 70–99)
Glucose-Capillary: 135 mg/dL — ABNORMAL HIGH (ref 70–99)

## 2021-10-06 LAB — CBC
HCT: 23.5 % — ABNORMAL LOW (ref 39.0–52.0)
Hemoglobin: 8 g/dL — ABNORMAL LOW (ref 13.0–17.0)
MCH: 31 pg (ref 26.0–34.0)
MCHC: 34 g/dL (ref 30.0–36.0)
MCV: 91.1 fL (ref 80.0–100.0)
Platelets: 251 10*3/uL (ref 150–400)
RBC: 2.58 MIL/uL — ABNORMAL LOW (ref 4.22–5.81)
RDW: 16 % — ABNORMAL HIGH (ref 11.5–15.5)
WBC: 18.7 10*3/uL — ABNORMAL HIGH (ref 4.0–10.5)
nRBC: 0.1 % (ref 0.0–0.2)

## 2021-10-06 LAB — BASIC METABOLIC PANEL
Anion gap: 5 (ref 5–15)
BUN: 35 mg/dL — ABNORMAL HIGH (ref 8–23)
CO2: 25 mmol/L (ref 22–32)
Calcium: 7.9 mg/dL — ABNORMAL LOW (ref 8.9–10.3)
Chloride: 109 mmol/L (ref 98–111)
Creatinine, Ser: 0.6 mg/dL — ABNORMAL LOW (ref 0.61–1.24)
GFR, Estimated: >60 mL/min (ref >60)
Glucose, Bld: 126 mg/dL — ABNORMAL HIGH (ref 70–99)
Potassium: 4.6 mmol/L (ref 3.5–5.1)
Sodium: 139 mmol/L (ref 135–145)

## 2021-10-06 LAB — MAGNESIUM: Magnesium: 2.4 mg/dL (ref 1.7–2.4)

## 2021-10-06 MED ORDER — THIAMINE HCL 100 MG PO TABS
100.0000 mg | ORAL_TABLET | Freq: Every day | ORAL | Status: DC
  Administered 2021-10-07 – 2021-10-29 (×22): 100 mg
  Filled 2021-10-06 (×25): qty 1

## 2021-10-06 MED ORDER — PANTOPRAZOLE 2 MG/ML SUSPENSION
40.0000 mg | Freq: Two times a day (BID) | ORAL | Status: DC
  Administered 2021-10-06 – 2021-10-11 (×11): 40 mg
  Filled 2021-10-06 (×11): qty 20

## 2021-10-06 MED ORDER — SODIUM CHLORIDE 0.9% FLUSH
10.0000 mL | Freq: Two times a day (BID) | INTRAVENOUS | Status: DC
  Administered 2021-10-06 (×2): 10 mL
  Administered 2021-10-06 – 2021-10-07 (×2): 20 mL
  Administered 2021-10-08: 10 mL
  Administered 2021-10-08: 20 mL
  Administered 2021-10-09 – 2021-10-17 (×16): 10 mL
  Administered 2021-10-18: 20 mL
  Administered 2021-10-18 – 2021-10-19 (×2): 10 mL
  Administered 2021-10-19 – 2021-10-20 (×2): 20 mL
  Administered 2021-10-22 – 2021-10-29 (×15): 10 mL

## 2021-10-06 MED ORDER — SODIUM CHLORIDE 0.9% FLUSH: 10.0000 mL | INTRAVENOUS | Status: DC | PRN

## 2021-10-06 MED ORDER — ALBUTEROL SULFATE (2.5 MG/3ML) 0.083% IN NEBU
2.5000 mg | INHALATION_SOLUTION | RESPIRATORY_TRACT | Status: DC | PRN
Start: 2021-10-06 — End: 2021-10-30
  Administered 2021-10-22 – 2021-10-25 (×2): 2.5 mg via RESPIRATORY_TRACT
  Filled 2021-10-06 (×3): qty 3

## 2021-10-06 MED ORDER — GUAIFENESIN 100 MG/5ML PO LIQD
10.0000 mL | Freq: Four times a day (QID) | ORAL | Status: DC
  Administered 2021-10-06 – 2021-10-23 (×67): 10 mL
  Filled 2021-10-06 (×68): qty 10

## 2021-10-06 MED ORDER — IPRATROPIUM-ALBUTEROL 0.5-2.5 (3) MG/3ML IN SOLN
3.0000 mL | Freq: Four times a day (QID) | RESPIRATORY_TRACT | Status: DC
  Administered 2021-10-06 – 2021-10-23 (×67): 3 mL via RESPIRATORY_TRACT
  Filled 2021-10-06 (×61): qty 3

## 2021-10-06 MED ORDER — SODIUM CHLORIDE 3 % IN NEBU
4.0000 mL | INHALATION_SOLUTION | Freq: Four times a day (QID) | RESPIRATORY_TRACT | Status: AC
  Administered 2021-10-06 – 2021-10-09 (×12): 4 mL via RESPIRATORY_TRACT
  Filled 2021-10-06 (×12): qty 4

## 2021-10-06 MED ORDER — CLONAZEPAM 0.5 MG PO TABS
0.5000 mg | ORAL_TABLET | Freq: Two times a day (BID) | ORAL | Status: DC
  Administered 2021-10-06: 0.5 mg via ORAL
  Filled 2021-10-06: qty 1

## 2021-10-06 MED ORDER — ATROPINE SULFATE 1 MG/10ML IJ SOSY
1.0000 mg | PREFILLED_SYRINGE | Freq: Once | INTRAMUSCULAR | Status: AC
  Administered 2021-10-06: 1 mg via INTRAVENOUS

## 2021-10-06 MED ORDER — GUAIFENESIN 200 MG PO TABS: 200.0000 mg | ORAL_TABLET | Freq: Four times a day (QID) | ORAL | Status: DC

## 2021-10-06 MED ORDER — ATROPINE SULFATE 1 MG/10ML IJ SOSY
1.0000 mg | PREFILLED_SYRINGE | INTRAMUSCULAR | Status: DC | PRN
  Filled 2021-10-06 (×2): qty 10

## 2021-10-06 MED ORDER — AMLODIPINE BESYLATE 10 MG PO TABS
10.0000 mg | ORAL_TABLET | Freq: Every day | ORAL | Status: DC
  Administered 2021-10-06 – 2021-10-12 (×7): 10 mg
  Filled 2021-10-06 (×7): qty 1

## 2021-10-06 MED ORDER — ALBUTEROL SULFATE (2.5 MG/3ML) 0.083% IN NEBU: 2.5000 mg | INHALATION_SOLUTION | Freq: Four times a day (QID) | RESPIRATORY_TRACT | Status: DC

## 2021-10-06 NOTE — Progress Notes (Signed)
SLP Cancellation Note ? ?Patient Details ?Name: Troy Sampson ?MRN: [id number] ?DOB: 1955-10-21 ? ? ?Cancelled treatment:       Reason Eval/Treat Not Completed: Patient not medically ready;Other (comment) (patient remains on vent, had bradycardic event early this AM, RT suctioned copius amount of oral secretions and mucous plugs removed. SLP will follow for patient readiness for PMV, swallow evaluations) ? ? ?Angela Nevin, MA, CCC-SLP ?Speech Therapy ? ?

## 2021-10-06 NOTE — Progress Notes (Signed)
Inpatient Rehab Admissions Coordinator:  ? ?Per PT recommendations pt was screened for CIR appropriateness by Estill Dooms, PT, DPT.  At this time, note pt still requiring mechanical ventilation.  We will follow from a distance until liberating from MV and rescreen.  No consult recommended at this time.  ? ?Estill Dooms, PT, DPT ?Admissions Coordinator ?[phone] ?10/06/21  ?10:34 AM ? ?

## 2021-10-06 NOTE — Progress Notes (Signed)
Patient seen today by trach team for consult.  No education needed at this time.  All the necessary equipment is at the bedside.  Will continue to follow for progression.  Patient is on full ventilatory support. ?

## 2021-10-06 NOTE — Evaluation (Signed)
Occupational Therapy Evaluation Patient Details Name: Troy Sampson MRN: [id number] DOB: [birth year] Today's Date: 10/06/2021   History of Present Illness Pt is a 66 y/o male who presented to the ED 2/22 with SOB. After arrival, pt became obtunded due to severe hypoxia and subsequently intubated emergently. Pt admitted with dx of acute hypoxic/hypercapnic respiratory failure 2nd to community acquired pneumonia.  Further testing revealed Hep C, suspected COPD, and potential underlying malignancy. Pt underwent tracheostomy 3/4. PMHx: duodenal ulcer, IDA, nephrolithiasis, ETOH   Clinical Impression   Pt admitted with the above diagnoses and presents with below problem list. Pt will benefit from continued acute OT to address the below listed deficits and maximize independence with basic ADLs prior to d/c to next venue. At baseline, pt is independent with ADLs, recently retired, drives, active at baseline. PLOF and home setup data per spouse via phone. Pt presents with profound global weakness, currently intubated. Pt with increased WOB during session but able to continue after each rest break. Pt currently needs total A with all ADLs, +2 total assist to pivot to recliner. Recommend staff utilize hoyer lift for now. Given pt's baseline PLOF, motivation, and  good level of family support recommend AIR consult.       Recommendations for follow up therapy are one component of a multi-disciplinary discharge planning process, led by the attending physician.  Recommendations may be updated based on patient status, additional functional criteria and insurance authorization.   Follow Up Recommendations  Acute inpatient rehab (3hours/day) pending progress   Assistance Recommended at Discharge Frequent or constant Supervision/Assistance  Patient can return home with the following Two people to help with walking and/or transfers;Two people to help with bathing/dressing/bathroom;Assistance with  cooking/housework;Assistance with feeding;Direct supervision/assist for medications management;Direct supervision/assist for financial management;Assist for transportation;Help with stairs or ramp for entrance    Functional Status Assessment  Patient has had a recent decline in their functional status and demonstrates the ability to make significant improvements in function in a reasonable and predictable amount of time.  Equipment Recommendations  Other (comment) (TBD)    Recommendations for Other Services       Precautions / Restrictions Precautions Precautions: Other (comment) Precaution Comments: vent via trach, NG tube Restrictions Weight Bearing Restrictions: No      Mobility Bed Mobility Overal bed mobility: Needs Assistance Bed Mobility: Rolling, Sidelying to Sit Rolling: +2 for physical assistance, Total assist Sidelying to sit: Total assist, +2 for physical assistance, +2 for safety/equipment, HOB elevated       General bed mobility comments: pt very weak able to follow commands for extremity movement but lacks necessary strength for active movement, ultimately requiring total A R and L for pericare and for coming to seated EoB    Transfers Overall transfer level: Needs assistance Equipment used: 2 person hand held assist Transfers: Bed to chair/wheelchair/BSC            Lateral/Scoot Transfers: Total assist, +2 physical assistance General transfer comment: PT/OT utilized pad to laterally slide pt from bed to chair. pt with improved alertness in seated up in chair      Balance Overall balance assessment: Needs assistance Sitting-balance support: Bilateral upper extremity supported, Feet supported Sitting balance-Leahy Scale: Zero                                     ADL either performed or assessed with clinical judgement   ADL Overall  ADL's : Needs assistance/impaired Eating/Feeding: NPO   Grooming: Total assistance   Upper Body  Bathing: Total assistance   Lower Body Bathing: Total assistance   Upper Body Dressing : Total assistance   Lower Body Dressing: Total assistance                 General ADL Comments: fair oral-motor strength to moveYonker in mouth. total A for ADLs currently. profoundly weak     Vision         Perception     Praxis      Pertinent Vitals/Pain Pain Assessment Pain Assessment: Faces Faces Pain Scale: Hurts little more Pain Location: generalized with movement Pain Descriptors / Indicators: Discomfort, Grimacing, Guarding, Moaning Pain Intervention(s): Limited activity within patient's tolerance, Monitored during session, Repositioned     Hand Dominance     Extremity/Trunk Assessment Upper Extremity Assessment Upper Extremity Assessment: Generalized weakness (bilateral edema. 3-/5 composite flexion/extension R hand, 2/5 for same on L hand. no AROM observed shoulder to wrist.)   Lower Extremity Assessment Lower Extremity Assessment: Defer to PT evaluation       Communication Communication Communication: Tracheostomy   Cognition Arousal/Alertness: Lethargic Behavior During Therapy: Flat affect Overall Cognitive Status: Difficult to assess                                 General Comments: Sleepy. Eyes open. Little to no interaction with therapist. Not following simple commands.     General Comments  RT in room throughout duration of session, max noted RR 47 bpm, sacral wound noted when rolling for pericare    Exercises     Shoulder Instructions      Home Living Family/patient expects to be discharged to:: Private residence Living Arrangements: Spouse/significant other Available Help at Discharge: Family Type of Home: House Home Access: Stairs to enter Technical brewer of Steps: 2 Entrance Stairs-Rails: None Home Layout: One level     Bathroom Shower/Tub: Teacher, early years/pre: Standard Bathroom Accessibility: No  (small RW possibly)   Home Equipment: None   Additional Comments: home information and PLOF per spouse via phone      Prior Functioning/Environment Prior Level of Function : Independent/Modified Independent;Driving (retired in January, shopping)                        OT Problem List: Decreased strength;Decreased range of motion;Decreased activity tolerance;Impaired balance (sitting and/or standing);Decreased coordination;Decreased cognition;Decreased safety awareness;Decreased knowledge of use of DME or AE;Decreased knowledge of precautions;Cardiopulmonary status limiting activity;Impaired UE functional use;Pain;Increased edema      OT Treatment/Interventions: Self-care/ADL training;Therapeutic exercise;Energy conservation;DME and/or AE instruction;Therapeutic activities;Cognitive remediation/compensation;Balance training;Patient/family education    OT Goals(Current goals can be found in the care plan section) Acute Rehab OT Goals Patient Stated Goal: pt indicating eagerness to mobilize. spouse open to AIR consult OT Goal Formulation: With patient/family Time For Goal Achievement: 10/20/21 Potential to Achieve Goals: Good ADL Goals Pt Will Perform Grooming: with max assist;sitting;bed level Pt Will Perform Upper Body Bathing: with max assist;bed level;sitting Pt Will Perform Lower Body Bathing: sitting/lateral leans;with mod assist Pt/caregiver will Perform Home Exercise Program: Increased ROM;Both right and left upper extremity;With minimal assist;With written HEP provided Additional ADL Goal #1: Pt will tolerate sitting EOB five minutes with mod A for balance.  OT Frequency: Min 3X/week    Co-evaluation PT/OT/SLP Co-Evaluation/Treatment: Yes Reason for Co-Treatment: Complexity of the patient's  impairments (multi-system involvement);For patient/therapist safety PT goals addressed during session: Mobility/safety with mobility OT goals addressed during session: ADL's and  self-care;Strengthening/ROM      AM-PAC OT "6 Clicks" Daily Activity     Outcome Measure Help from another person eating meals?: Total Help from another person taking care of personal grooming?: Total Help from another person toileting, which includes using toliet, bedpan, or urinal?: Total Help from another person bathing (including washing, rinsing, drying)?: Total Help from another person to put on and taking off regular upper body clothing?: Total Help from another person to put on and taking off regular lower body clothing?: Total 6 Click Score: 6   End of Session Nurse Communication: Mobility status;Need for lift equipment  Activity Tolerance: Patient limited by fatigue;Other (comment) (increased WOB) Patient left: in chair;with call bell/phone within reach;with chair alarm set  OT Visit Diagnosis: Other abnormalities of gait and mobility (R26.89);Unsteadiness on feet (R26.81);Muscle weakness (generalized) (M62.81);Feeding difficulties (R63.3);Other symptoms and signs involving the nervous system (R29.898);Other symptoms and signs involving cognitive function;Pain                Time: 0912-1000 OT Time Calculation (min): 48 min Charges:  OT General Charges $OT Visit: 1 Visit OT Evaluation $OT Eval Moderate Complexity: [place], OT Acute Rehabilitation Services Office: [phone]   Hortencia Pilar 10/06/2021, 12:06 PM

## 2021-10-06 NOTE — Progress Notes (Signed)
? ?NAME:  Troy Sampson, MRN:  [id number], DOB:  November 20, 1955, LOS: 14 ?ADMISSION DATE:  09/21/2021, CONSULTATION DATE:  09/22/2021 ?REFERRING MD:  [facility], CHIEF COMPLAINT:  Shortness of Breath  ? ?History of Present Illness:  ?66 y/o male with a PMHx of duodenal ulcer, IDA, nephrolithiasis and a current smoker who presented from [facility] with c/o SOB x 1 week. History obtained by his wife due to patient arrived obtunded, markedly hypoxic oxygen sats in the low 80s. Patient placed on BiPAP and given Ativan. He was fairly tachypnis with RR in 40s as well as tachycardia and was ultimately intubated for acute hypoxic respiratory failure and increased work of breathing. Noted to have a fever of 101.8. CXR show a LLL pneumonia. PCCM consulted for ICU admission.  ? 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 ?Significant Hospital Events: ?Including procedures, antibiotic start and stop dates in addition to other pertinent events   ?2/22: Admitted overnight to ICU. Blood cultures 3/4 positive for strep pneumo. Strep pneumo antigen positive, TTE with EF 50 to 55% no wall motion abnormalities ?2/26: New onset fevers. Antibiotics broadened to Cefepime and Vancomycin. Propofol switched to Precedex.  ?2/27: Bradycardia noted on Precedex with increased tachypnea.  ?2/28, antibiotics changed to penicillin G per infectious disease recommendation ?3/1: Vasoactive drip requirements improving, received 1 unit of blood for hemoglobin of 6.1 ?3/2 follow-up hemoglobin 6.8, received second unit of blood, has also received 3 units of platelets since admission.  Off norepinephrine  ?3/3 EGD> grade D esophagitis with bleeding ?3/4 tracheostomy ?3/7 ongoing tachypnea limiting SBT. Unable to SBT at all this morning. Overnight episode of bradycardia when being turned requiring atropine ? ?Interim History / Subjective:  ?Episode of bradycardia overnight when being turned;  given atropine ? ?Trached on PRVC ?Denies pain ? ?Objective   ?Blood pressure (!) 159/81, pulse 92, temperature 98.5 ?F (36.9 ?C), temperature source Axillary, resp. rate (!) 36, height 5\' 7"  (1.702 m), weight 53.6 kg, SpO2 98 %. ?   ?Vent Mode: PRVC ?FiO2 (%):  [30 %-60 %] 40 % ?Set Rate:  [35 bmp] 35 bmp ?Vt Set:  [500 mL] 500 mL ?PEEP:  [8 cmH20] 8 cmH20 ?Plateau Pressure:  [20 cmH20-27 cmH20] 27 cmH20  ? ?Intake/Output Summary (Last 24 hours) at 10/06/2021 0840 ?Last data filed at 10/06/2021 0600 ?Gross per 24 hour  ?Intake 1572.01 ml  ?Output 1900 ml  ?Net -327.99 ml  ? ? ?Filed Weights  ? 10/04/21 0500 10/05/21 0500 10/06/21 0452  ?Weight: 56.2 kg 58.1 kg 53.6 kg  ? ? ?Examination: ?General:   ill appearing male patient on mech vent ?HEENT: MM pink/moist; oral secretions present; trach in place ?Neuro: Ao; MAE ?CV: s1s2, RRR, no m/r/g ?PULM:  rhonchi BS bilaterally; trached on mech vent PRVC ?GI: soft, bsx4 active  ?Extremities: warm/dry ?Skin: no rashes or lesions  appreciated ? ?Resolved Hospital Problem list   ?Septic shock ?Lactic acidosis ?Acute urinary retention ?Thrombocytopenia ?Type 2 MI ? ?Assessment & Plan:  ? ?Acute hypoxic and hypercapnic respiratory failure 2/2 Community acquired pneumonia s/p trach ?Probable COPD ?Tracheostomy dependent for prolonged respiratory failure ?P: ?-continue mech vent PRVC ?-will check abg and adjust as needed ?-PSV trials as tolerated; consider TCT if doing well on PSV trials ?-wean fio2 for sats >92% ?-vap prevention place ?-Pen G completed 3/7 ?-will schedule duoneb and hypertonic saline ?-CPT scheduled ?-trach care per protocol ?-scheduled lasix ? ?Severe sepsis  2/2 Strep pneumo CAP + bacteremia; blood cultures cleared with treatment.  ?-Per ID recommendation continue penicillin until 10/06/2021 ?P: ?-TEE ordered ?-Pen G complete ?-thick secretions remain present; repeat trach culture today ? ?Acute metabolic encephalopathy due to sepsis:  improved ?Anxiety? ?P: ?-continue seroquel and oxy ?-prn fentanyl ?  ?Hypertension:  ?HLD ?P: ?-increasing dose of norvasc ?-continue lopressor ?-prn hydralazine ?-cont statin ?   ?Acute Urinary Retention, resolved ?P: ?-bladder scan prn ?-trend UOP ? ?Acute blood loss anemia on top of critical illness ?Upper GI bleeding from esophagitis, grade D ?P: ?-holding ASA and subcu heparin ?-SCDs for dvt prophylaxis ?-trend cbc and transfuse for hgb <7 or hemodynamically unstable ?  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 PSA minimally elevated so low suspicion for that being primary. AFP negative. Discussed pursuing IR biopsy in the future with family but given critical illness, patient is not currently stable enough. Suspect this is chronic given last IVDA occurred over 30 years ago. Viral load elevated at 27, 200,000.  ?P: ?-will need biopsy when more stable ? ?Severe protein calorie malnutrition, cachexia ?P: ?-continue TF through cortrak ?- MVI ? ?History of tobacco abuse ?P: ?-cont nicotine patch ?-will need smoking cessation counseling ?  ?Arm edema: dopplers largely negative but unable to visualize bilateral basilic and cephalic veins. More likely represents anasarca.  ?P: ?-lasix scheduled ? ? ?Best Practice (right click and "Reselect all SmartList Selections" daily)  ? ?Diet/type: tubefeeds ?DVT prophylaxis: prophylactic heparin  ?GI prophylaxis: PPI ?Lines: N/A ?Foley:  N/A ?Code Status:  full code ?Last date of multidisciplinary goals of care discussion [ Daughter updated] ? ? ?Critical care time: ?  ? ?Patsy Lager, PA-C ?[facility] ?10/06/2021, 9:32 AM ? ?Please see [email or web address] for pager details. ? ?From 7A-7P if no response, please call [phone]. ?After hours, please call Pola Corn [phone]. ? ? ? ? ? ? ? ?

## 2021-10-06 NOTE — Progress Notes (Signed)
Patient HR dropped to 20s at 00:11. Prior to bradycardic event patient had been changed due to a bowel movement. Then shortly after, patient was orally suctioned with copious amount oral secretion and immediately after patient went bradycardic. E link was notified and patient was given 1 mg of atropine immediately after. Patient heart rate increased into 40s then 50s and upward. Patient was then suctioned by RT with the presence of mucus plugs being removed. And inner cannula was changed. No further orders were given.Patient vitals gradually stabilized.  ?

## 2021-10-06 NOTE — Progress Notes (Signed)
eLink Physician-Brief Progress Note ?Patient Name: Troy Sampson ?DOB: May 28, 1956 ?MRN: [id number] ? ? ?Date of Service ? 10/06/2021  ?HPI/Events of Note ? Patient became bradycardic with HR into the 20's while rolling him to be changed. Atropine 1 mg give. BP now = 125/77 with HR = 105. Sat = 100%. Vagal event?  ?eICU Interventions ? Continue present management.   ? ? ? ?Intervention Category ?Major Interventions: Arrhythmia - evaluation and management ? ?[name] ?10/06/2021, 12:30 AM ?

## 2021-10-06 NOTE — Progress Notes (Signed)
Physical Therapy Treatment ?Patient Details ?Name: Troy Sampson ?MRN: [id number] ?DOB: Mar 16, 1956 ?Today's Date: 10/06/2021 ? ? ?History of Present Illness Pt is a 66 y/o male who presented to the ED 2/22 with SOB. After arrival, pt became obtunded due to severe hypoxia and subsequently intubated emergently. Pt admitted with dx of acute hypoxic/hypercapnic respiratory failure 2nd to community acquired pneumonia.  Further testing revealed Hep C, suspected COPD, and potential underlying malignancy. Pt underwent tracheostomy 3/4. PMHx: duodenal ulcer, IDA, nephrolithiasis, ETOH ? ?  ?PT Comments  ? ? Pt much more awake and alert than last PT session. Pt is globally very weak which is limiting his ability to mobilize, however pt able to follow commands and elicits a flicker of muscle movement in appropriate extremity to assist in rolling and bed mobility. That said pt is a total A x 2 for all mobility at this time. Pt's wife was contacted after session and pt is reported to have just retired in January after 30 years of work and was completely independent with mobility and iADLs. Given pt PLOF and obvious desire to participate, PT recommending AIR level rehab at discharge to maximize recovery. PT will continue to follow acutely.  ?  ?Recommendations for follow up therapy are one component of a multi-disciplinary discharge planning process, led by the attending physician.  Recommendations may be updated based on patient status, additional functional criteria and insurance authorization. ? ?Follow Up Recommendations ? Acute inpatient rehab (3hours/day) (pending progress) ?  ?  ?Assistance Recommended at Discharge Frequent or constant Supervision/Assistance  ?Patient can return home with the following Two people to help with walking and/or transfers;Two people to help with bathing/dressing/bathroom;Assistance with cooking/housework;Assistance with feeding;Direct supervision/assist for medications management;Direct  supervision/assist for financial management;Assist for transportation;Help with stairs or ramp for entrance;Other (comment) (hoyer lift) ?  ?Equipment Recommendations ? Hospital bed;BSC/3in1;Wheelchair (measurements PT);Wheelchair cushion (measurements PT) (hoyer lift)  ?  ?Recommendations for Other Services Rehab consult ? ? ?  ?Precautions / Restrictions Precautions ?Precautions: Other (comment) ?Precaution Comments: vent via trach, NG tube ?Restrictions ?Weight Bearing Restrictions: No  ?  ? ?Mobility ? Bed Mobility ?Overal bed mobility: Needs Assistance ?Bed Mobility: Rolling, Sidelying to Sit ?Rolling: +2 for physical assistance, Total assist ?Sidelying to sit: Total assist, +2 for physical assistance, +2 for safety/equipment, HOB elevated ?  ?  ?  ?General bed mobility comments: pt very weak able to follow commands for extremity movement but lacks necessary strength for active movement, ultimately requiring total A R and L for pericare and for coming to seated EoB ?  ? ?Transfers ?Overall transfer level: Needs assistance ?Equipment used: 2 person hand held assist ?Transfers: Bed to chair/wheelchair/BSC ?  ?  ?  ?  ?  ? Lateral/Scoot Transfers: Total assist, +2 physical assistance ?General transfer comment: PT/OT utilized pad to laterally slide pt from bed to chair. pt with improved alertness in seated up in chair ?  ? ?Ambulation/Gait ?  ?  ?  ?  ?  ?  ?  ?General Gait Details: unable ? ? ?  ? ? ?  ?Balance Overall balance assessment: Needs assistance ?Sitting-balance support: Bilateral upper extremity supported, Feet supported ?Sitting balance-Leahy Scale: Zero ?  ?  ?  ?  ?  ?  ?  ?  ?  ?  ?  ?  ?  ?  ?  ?  ?  ? ?  ?Cognition Arousal/Alertness: Lethargic ?Behavior During Therapy: Flat affect ?Overall Cognitive Status: Difficult to assess ?  ?  ?  ?  ?  ?  ?  ?  ?  ?  ?  ?  ?  ?  ?  ?  ?  General Comments: Sleepy. Eyes open. Little to no interaction with therapist. Not following simple commands. ?  ?  ? ?   ?Exercises   ? ?  ?General Comments General comments (skin integrity, edema, etc.): RT in room throughout duration of session, max noted RR 47 bpm, sacral wound noted when rolling for pericare ?  ?  ? ?Pertinent Vitals/Pain Pain Assessment ?Pain Assessment: Faces ?Faces Pain Scale: Hurts little more ?Pain Location: generalized with movement ?Pain Descriptors / Indicators: Discomfort, Grimacing, Guarding, Moaning ?Pain Intervention(s): Limited activity within patient's tolerance, Monitored during session, Repositioned  ? ? ?Home Living Family/patient expects to be discharged to:: Private residence ?Living Arrangements: Spouse/significant other ?Available Help at Discharge: Family ?Type of Home: House ?Home Access: Stairs to enter ?Entrance Stairs-Rails: None ?Entrance Stairs-Number of Steps: 2 ?  ?Home Layout: One level ?Home Equipment: None ?   ?  ?Prior Function   Completely independent in mobility and iADLs ?  ?  ?   ? ?PT Goals (current goals can now be found in the care plan section) Acute Rehab PT Goals ?Patient Stated Goal: unable to state ?PT Goal Formulation: Patient unable to participate in goal setting ?Time For Goal Achievement: 10/17/21 ?Potential to Achieve Goals: Poor ?Progress towards PT goals: Progressing toward goals ? ?  ?Frequency ? ? ? Min 2X/week ? ? ? ?  ?PT Plan Discharge plan needs to be updated  ? ? ?Co-evaluation PT/OT/SLP Co-Evaluation/Treatment: Yes ?Reason for Co-Treatment: Complexity of the patient's impairments (multi-system involvement);For patient/therapist safety ?PT goals addressed during session: Mobility/safety with mobility ?  ?  ? ?  ?AM-PAC PT "6 Clicks" Mobility   ?Outcome Measure ? Help needed turning from your back to your side while in a flat bed without using bedrails?: Total ?Help needed moving from lying on your back to sitting on the side of a flat bed without using bedrails?: Total ?Help needed moving to and from a bed to a chair (including a wheelchair)?:  Total ?Help needed standing up from a chair using your arms (e.g., wheelchair or bedside chair)?: Total ?Help needed to walk in hospital room?: Total ?Help needed climbing 3-5 steps with a railing? : Total ?6 Click Score: 6 ? ?  ?End of Session Equipment Utilized During Treatment: Oxygen (vent via trach) ?Activity Tolerance: Patient limited by lethargy;Patient limited by fatigue ?Patient left: in chair;with call bell/phone within reach;with chair alarm set ?Nurse Communication: Mobility status ?PT Visit Diagnosis: Other abnormalities of gait and mobility (R26.89);Muscle weakness (generalized) (M62.81) ?  ? ? ?Time: [id number] ?PT Time Calculation (min) (ACUTE ONLY): 43 min ? ?Charges:  $Therapeutic Activity: 23-37 mins          ?          ? ?Kenon Delashmit B. Migdalia Dk PT, DPT ?Acute Rehabilitation Services ?Pager [phone] ?Office [phone] ? ? ? ?[place] ?10/06/2021, 10:31 AM ? ?

## 2021-10-06 NOTE — TOC Progression Note (Signed)
Transition of Care (TOC) - Initial/Assessment Note  ? ? ?Patient Details  ?Name: Troy Sampson ?MRN: [id number] ?Date of Birth: 1956/07/13 ? ?Transition of Care (TOC) CM/SW Contact:    ?Catalina Pizza Reilly Molchan, LCSWA ?Phone Number: ?10/06/2021, 11:57 AM ? ?Clinical Narrative:                 ?CSW received consult for LTACH placement and meet with the patient and his nephew at bedside.  The patient was seated in the chair and mouthed and nodded in response to CSW.   CSW inquired about LTACH and the patient mouthed that "it didn't matter" whether he went to an [facility]  CSW informed the patient that patient's wife would be contacted and asked as well.  ? ?CSW contacted the patient's spouse and offered choice of LTACH facilities.  The patient's wife is in agreement with the patient going to an [facility] and has a preference for [facility].   ? ?CSW contacted Victorino Dike with [facility] and requested that she review the patient. ? ?Expected Discharge Plan: Long Term Nursing Home ?  ? ? ?Patient Goals and CMS Choice ?  ?  ?  ? ?Expected Discharge Plan and Services ?Expected Discharge Plan: Long Term Nursing Home ?  ?  ?  ?  ?                ?  ?  ?  ?  ?  ?  ?  ?  ?  ?  ? ?Prior Living Arrangements/Services ?  ?  ?  ?       ?  ?  ?  ?  ? ?Activities of Daily Living ?  ?  ? ?Permission Sought/Granted ?  ?  ?   ?   ?   ?   ? ?Emotional Assessment ?  ?  ?  ?  ?  ?  ? ?Admission diagnosis:  Acute respiratory failure with hypoxia (HCC) [J96.01] ?Community acquired pneumonia of left lower lobe of lung [J18.9] ?Sepsis, due to unspecified organism, unspecified whether acute organ dysfunction present (HCC) [A41.9] ?Acute respiratory failure (HCC) [J96.00] ?Patient Active Problem List  ? Diagnosis Date Noted  ? Status post tracheostomy (HCC)   ? Protein-calorie malnutrition, severe 10/01/2021  ? Pressure injury of skin 09/30/2021  ? Pneumonia, pneumococcal (HCC) 09/30/2021  ? Pneumococcal bacteremia 09/30/2021  ? Acute metabolic encephalopathy 09/30/2021  ?  Alteration in electrolyte and fluid balance 09/30/2021  ? Hepatitis C 09/30/2021  ? Liver mass 09/30/2021  ? Anemia 09/30/2021  ? NSTEMI (non-ST elevated myocardial infarction) (HCC) 09/30/2021  ? Thrombocytopenia (HCC) 09/30/2021  ? Acute respiratory failure (HCC) 09/22/2021  ? Protein calorie malnutrition (HCC) 09/22/2021  ? Acute respiratory failure with hypoxia (HCC)   ? Sepsis (HCC)   ? Community acquired pneumonia of left lower lobe of lung 09/21/2021  ? ?PCP:  Pcp, No ?Pharmacy:   ?[id number] [place], [place] - [address] ?[address] ?[place] [address] ?Phone: [phone] Fax: [phone] ? ? ? ? ?Social Determinants of Health (SDOH) Interventions ?  ? ?Readmission Risk Interventions ?No flowsheet data found. ? ? ?

## 2021-10-06 NOTE — Progress Notes (Signed)
RT NOTE: Patient out of bed and up in chair at this time. CPT unable to be performed through bed while patient is up in chair. CPT to be done at next scheduled time. Vitals are stable. RT will continue to monitor.  ?

## 2021-10-07 ENCOUNTER — Inpatient Hospital Stay (HOSPITAL_COMMUNITY): Payer: Medicare Other

## 2021-10-07 LAB — CBC
HCT: 20.8 % — ABNORMAL LOW (ref 39.0–52.0)
Hemoglobin: 6.7 g/dL — CL (ref 13.0–17.0)
MCH: 30.3 pg (ref 26.0–34.0)
MCHC: 32.2 g/dL (ref 30.0–36.0)
MCV: 94.1 fL (ref 80.0–100.0)
Platelets: 269 10*3/uL (ref 150–400)
RBC: 2.21 MIL/uL — ABNORMAL LOW (ref 4.22–5.81)
RDW: 16.6 % — ABNORMAL HIGH (ref 11.5–15.5)
WBC: 19 10*3/uL — ABNORMAL HIGH (ref 4.0–10.5)
nRBC: 0 % (ref 0.0–0.2)

## 2021-10-07 LAB — BASIC METABOLIC PANEL
Anion gap: 5 (ref 5–15)
BUN: 43 mg/dL — ABNORMAL HIGH (ref 8–23)
CO2: 31 mmol/L (ref 22–32)
Calcium: 8 mg/dL — ABNORMAL LOW (ref 8.9–10.3)
Chloride: 109 mmol/L (ref 98–111)
Creatinine, Ser: 0.73 mg/dL (ref 0.61–1.24)
GFR, Estimated: >60 mL/min (ref >60)
Glucose, Bld: 139 mg/dL — ABNORMAL HIGH (ref 70–99)
Potassium: 4.3 mmol/L (ref 3.5–5.1)
Sodium: 145 mmol/L (ref 135–145)

## 2021-10-07 LAB — URINALYSIS, ROUTINE W REFLEX MICROSCOPIC
Bilirubin Urine: NEGATIVE
Glucose, UA: NEGATIVE mg/dL
Hgb urine dipstick: NEGATIVE
Ketones, ur: NEGATIVE mg/dL
Leukocytes,Ua: NEGATIVE
Nitrite: NEGATIVE
Protein, ur: NEGATIVE mg/dL
Specific Gravity, Urine: 1.014 (ref 1.005–1.030)
pH: 6 (ref 5.0–8.0)

## 2021-10-07 LAB — APTT: aPTT: 25 seconds (ref 24–36)

## 2021-10-07 LAB — MAGNESIUM: Magnesium: 2.1 mg/dL (ref 1.7–2.4)

## 2021-10-07 LAB — PREPARE RBC (CROSSMATCH)

## 2021-10-07 LAB — GLUCOSE, CAPILLARY
Glucose-Capillary: 116 mg/dL — ABNORMAL HIGH (ref 70–99)
Glucose-Capillary: 129 mg/dL — ABNORMAL HIGH (ref 70–99)
Glucose-Capillary: 130 mg/dL — ABNORMAL HIGH (ref 70–99)
Glucose-Capillary: 135 mg/dL — ABNORMAL HIGH (ref 70–99)
Glucose-Capillary: 137 mg/dL — ABNORMAL HIGH (ref 70–99)
Glucose-Capillary: 140 mg/dL — ABNORMAL HIGH (ref 70–99)

## 2021-10-07 LAB — HEMOGLOBIN AND HEMATOCRIT, BLOOD
HCT: 25.5 % — ABNORMAL LOW (ref 39.0–52.0)
Hemoglobin: 8.3 g/dL — ABNORMAL LOW (ref 13.0–17.0)

## 2021-10-07 LAB — PROTIME-INR
INR: 1 (ref 0.8–1.2)
Prothrombin Time: 13.6 seconds (ref 11.4–15.2)

## 2021-10-07 MED ORDER — SODIUM CHLORIDE 0.9% IV SOLUTION
Freq: Once | INTRAVENOUS | Status: AC
  Administered 2021-10-07: 10:00:00 10 mL/h via INTRAVENOUS

## 2021-10-07 MED ORDER — CLONAZEPAM 0.5 MG PO TABS
0.5000 mg | ORAL_TABLET | Freq: Two times a day (BID) | ORAL | Status: DC
  Administered 2021-10-07 – 2021-10-08 (×3): 0.5 mg
  Filled 2021-10-07 (×4): qty 1

## 2021-10-07 MED ORDER — QUETIAPINE FUMARATE 100 MG PO TABS
100.0000 mg | ORAL_TABLET | Freq: Every day | ORAL | Status: DC
  Administered 2021-10-08: 100 mg
  Filled 2021-10-07: qty 1

## 2021-10-07 NOTE — Progress Notes (Signed)
? ?NAME:  Troy Sampson, MRN:  [id number], DOB:  1956/02/08, LOS: 49 ?ADMISSION DATE:  09/21/2021, CONSULTATION DATE:  09/22/2021 ?REFERRING MD:  [facility], CHIEF COMPLAINT:  Shortness of Breath  ? ?History of Present Illness:  ?66 y/o male with a PMHx of duodenal ulcer, IDA, nephrolithiasis and a current smoker who presented from [facility] with c/o SOB x 1 week. History obtained by his wife due to patient arrived obtunded, markedly hypoxic oxygen sats in the low 80s. Patient placed on BiPAP and given Ativan. He was fairly tachypnis with RR in 41s as well as tachycardia and was ultimately intubated for acute hypoxic respiratory failure and increased work of breathing. Noted to have a fever of 101.8. CXR show a LLL pneumonia. PCCM consulted for ICU admission.  ? 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 ?Significant Hospital Events: ?Including procedures, antibiotic start and stop dates in addition to other pertinent events   ?2/22: Admitted overnight to ICU. Blood cultures 3/4 positive for strep pneumo. Strep pneumo antigen positive, TTE with EF 50 to 55% no wall motion abnormalities ?2/26: New onset fevers. Antibiotics broadened to Cefepime and Vancomycin. Propofol switched to Precedex.  ?2/27: Bradycardia noted on Precedex with increased tachypnea.  ?2/28, antibiotics changed to penicillin G per infectious disease recommendation ?3/1: Vasoactive drip requirements improving, received 1 unit of blood for hemoglobin of 6.1 ?3/2 follow-up hemoglobin 6.8, received second unit of blood, has also received 3 units of platelets since admission.  Off norepinephrine  ?3/3 EGD> grade D esophagitis with bleeding ?3/4 tracheostomy ?3/7 ongoing tachypnea limiting SBT. Unable to SBT at all this morning. Overnight episode of bradycardia when being turned requiring atropine ? ?Interim History / Subjective:  ?Hemoglobin drops to 6.7 overnight.  Pending 1 unit  transfusion ?Patient denies any new symptoms overnight.  Denies any dysuria. ? ?Objective   ?Blood pressure 134/67, pulse 94, temperature (!) 100.4 ?F (38 ?C), temperature source Axillary, resp. rate (!) 22, height 5\' 7"  (1.702 m), weight 54.9 kg, SpO2 98 %. ?   ?Vent Mode: PRVC ?FiO2 (%):  [40 %-50 %] 40 % ?Set Rate:  [24 bmp] 24 bmp ?Vt Set:  [500 mL] 500 mL ?PEEP:  [8 cmH20] 8 cmH20 ?Plateau Pressure:  [19 cmH20-28 cmH20] 19 cmH20  ? ?Intake/Output Summary (Last 24 hours) at 10/07/2021 0737 ?Last data filed at 10/07/2021 0600 ?Gross per 24 hour  ?Intake 1500 ml  ?Output 2925 ml  ?Net -1425 ml  ? ? ?Filed Weights  ? 10/05/21 0500 10/06/21 0452 10/07/21 0500  ?Weight: 58.1 kg 53.6 kg 54.9 kg  ? ? ?Examination: ?General:   ill appearing male patient on mech vent ?HEENT: MM pink/moist; oral secretions present; trach in place ?Neuro: Awake and alert, nonverbal ?CV: s1s2, RRR, no m/r/g ?PULM: trached on mech vent PRVC ?GI: soft, bsx4 active  ?Extremities: warm/dry, +1 bilateral LE and UE edema ?Skin: no rashes or lesions  appreciated ? ?Resolved Hospital Problem list   ?Septic shock ?Lactic acidosis ?Acute urinary retention ?Thrombocytopenia ?Type 2 MI ? ?Assessment & Plan:  ? ?Acute hypoxic and hypercapnic respiratory failure ?Tracheostomy dependent for prolonged respiratory failure ?Probable COPD ?-continue mech vent PRVC ?-will check abg and adjust as needed ?-PSV trials as tolerated; consider TCT if doing well on PSV trials ?-wean fio2 for sats >92% ?-vap prevention place ?-[place] completed 3/7 ?-will schedule duoneb and hypertonic saline ?-CPT scheduled ?-trach care per protocol ? ?Severe sepsis  2/2 Strep pneumo CAP + bacteremia; blood cultures cleared with treatment.  ?He spiked a fever of 104 overnight with trending up white blood count.  Chest x-ray concerning for worsening left infiltrates, questionable necrotizing pneumonia.  Bedside ultrasound yesterday did not reveal significant pleural effusion. ?-Obtain blood  culture and UA ?-If fever persists, will get a chest CT ? ?Acute blood loss anemia ?Upper GI bleeding from esophagitis, grade D ?Hemoglobin dropped to 6.7 overnight, pending 1 unit transfusion.  Appears like slow bleeding from his esophagitis.   ?-If hemoglobin continues to trend down, will reconsult GI ?-holding ASA and subcu heparin ?-SCDs for dvt prophylaxis ?-trend cbc and transfuse for hgb <7 or hemodynamically unstable ? ?Acute metabolic encephalopathy due to sepsis: improved ?Anxiety? ?P: ?-continue seroquel and oxy ?-prn fentanyl ?  ?Hypertension:  ?HLD ?P: ?-continue lopressor and Norvasc ?-prn hydralazine ?-cont statin ?   ?Acute Urinary Retention, resolved ?P: ?-bladder scan prn ?-trend UOP ?  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 PSA minimally elevated so low suspicion for that being primary. AFP negative. Discussed pursuing IR biopsy in the future with family but given critical illness, patient is not currently stable enough. Suspect this is chronic given last IVDA occurred over 30 years ago. Viral load elevated at 27, 200,000.  ?P: ?-will need biopsy when more stable ? ?Severe protein calorie malnutrition, cachexia ?P: ?-continue TF through cortrak ?- MVI ? ?History of tobacco abuse ?P: ?-cont nicotine patch ?-will need smoking cessation counseling ?  ?Arm edema: dopplers largely negative but unable to visualize bilateral basilic and cephalic veins. More likely represents anasarca secondary to third spacing.  ?-Holding Lasix for now ? ? ?Best Practice (right click and "Reselect all SmartList Selections" daily)  ? ?Diet/type: tubefeeds ?DVT prophylaxis: prophylactic heparin  ?GI prophylaxis: PPI ?Lines: N/A ?Foley:  N/A ?Code Status:  full code ?Last date of multidisciplinary goals of care discussion [ Daughter  updated] ? ?Critical care time: ?  ? ?Gaylan Gerold, DO ?

## 2021-10-07 NOTE — Progress Notes (Signed)
10/07/2021 ?Resident Dr. Cyndie Chime and I saw and evaluated the patient. Discussed with them and agree with their findings and plan as documented in the their note. ? ?I have seen and evaluated the patient for acute hypoxic respiratory failure ? ?S:  ?Febrile overnight and Hb drop. No bleeding seen at bedside.  ? ?O: ?Blood pressure 134/67, pulse (!) 109, temperature (!) 100.4 ?F (38 ?C), temperature source Axillary, resp. rate (!) 29, height 5\' 7"  (1.702 m), weight 54.9 kg, SpO2 96 %.  ? ?Exam: ?Gen:       No acute distress ?HEENT:  tracking, sclera anicteric ?Neck:      can turn head without pain ?Lungs:    rhonchorous bilaterally; tachypneic ?CV:         tachycardic RR; no murmurs ?Abd:      + bowel sounds; soft, non-tender; no palpable masses, no distension ?Ext:    1-2+ peripheral edema; adequate peripheral perfusion ?Skin:       Warm and dry; no rash ?Neuro:    weak, grossly nonfocal ? ?Labs/imaging reviewed ? ?A:  ?# Acute hypoxic respiratory failure ?# Fever ?# Severe sepsis due to strep pneumo CAP, bacteremia s/p course of PCN G ?# Anxiety ?# Acute blood loss anemia due to upper GI bleeding with esophagitis ? ?P:  ?- f/u response to 1U pRBC today ?- f/u trach aspirate, BCx, UA, will check bedside of left pleural space again today but yesterday there was insufficient pocket to tap ?- hold off on diuresis until we clarify whether he's developed any GI or other bleeding again ?- hold DVT ppx, continue SCDs ?- continue bronchodilators, HTS, CPT for secretions ?- although he does remain hypoxic with elevated A-a gradient based on last ABG 3/8 and some of his tachypnea may be due to that, we will trial of clonazepam 0.5 BID for component of anxiety which may be limiting vent weaning ? ?Patient critically ill due to acute hypoxic respiratory failure ?Interventions to address this today oversight of vent weaning ?Risk of deterioration without these interventions is high ? ?I personally spent 36 minutes providing  critical care not including any separately billable procedures ? ? ?5/8 ?[facility] ? ? ?

## 2021-10-07 NOTE — Progress Notes (Signed)
eLink Physician-Brief Progress Note ?Patient Name: Troy Sampson ?DOB: Jan 05, 1956 ?MRN: [id number] ? ? ?Date of Service ? 10/07/2021  ?HPI/Events of Note ? Anemia - Hgb = 6.7. No obvious bleeding per RN.  ?eICU Interventions ? Will transfuse 1 unit PRBC now.   ? ? ? ?Intervention Category ?Major Interventions: Other: ? ?Blanch Stang Cornelia Copa ?10/07/2021, 6:24 AM ?

## 2021-10-08 ENCOUNTER — Inpatient Hospital Stay (HOSPITAL_COMMUNITY): Payer: Medicare Other

## 2021-10-08 LAB — TYPE AND SCREEN
ABO/RH(D): B POS
Antibody Screen: NEGATIVE
Unit division: 0

## 2021-10-08 LAB — CBC
HCT: 24.3 % — ABNORMAL LOW (ref 39.0–52.0)
Hemoglobin: 7.8 g/dL — ABNORMAL LOW (ref 13.0–17.0)
MCH: 30.1 pg (ref 26.0–34.0)
MCHC: 32.1 g/dL (ref 30.0–36.0)
MCV: 93.8 fL (ref 80.0–100.0)
Platelets: 290 10*3/uL (ref 150–400)
RBC: 2.59 MIL/uL — ABNORMAL LOW (ref 4.22–5.81)
RDW: 16.9 % — ABNORMAL HIGH (ref 11.5–15.5)
WBC: 20.1 10*3/uL — ABNORMAL HIGH (ref 4.0–10.5)
nRBC: 0.1 % (ref 0.0–0.2)

## 2021-10-08 LAB — BPAM RBC
Blood Product Expiration Date: 202303292359
ISSUE DATE / TIME: 202303090933
Unit Type and Rh: 7300

## 2021-10-08 LAB — BASIC METABOLIC PANEL
Anion gap: 6 (ref 5–15)
BUN: 43 mg/dL — ABNORMAL HIGH (ref 8–23)
CO2: 30 mmol/L (ref 22–32)
Calcium: 8.1 mg/dL — ABNORMAL LOW (ref 8.9–10.3)
Chloride: 112 mmol/L — ABNORMAL HIGH (ref 98–111)
Creatinine, Ser: 0.61 mg/dL (ref 0.61–1.24)
GFR, Estimated: >60 mL/min (ref >60)
Glucose, Bld: 129 mg/dL — ABNORMAL HIGH (ref 70–99)
Potassium: 4.2 mmol/L (ref 3.5–5.1)
Sodium: 148 mmol/L — ABNORMAL HIGH (ref 135–145)

## 2021-10-08 LAB — CULTURE, RESPIRATORY W GRAM STAIN

## 2021-10-08 LAB — GLUCOSE, CAPILLARY
Glucose-Capillary: 148 mg/dL — ABNORMAL HIGH (ref 70–99)
Glucose-Capillary: 122 mg/dL — ABNORMAL HIGH (ref 70–99)
Glucose-Capillary: 105 mg/dL — ABNORMAL HIGH (ref 70–99)
Glucose-Capillary: 134 mg/dL — ABNORMAL HIGH (ref 70–99)
Glucose-Capillary: 147 mg/dL — ABNORMAL HIGH (ref 70–99)
Glucose-Capillary: 134 mg/dL — ABNORMAL HIGH (ref 70–99)

## 2021-10-08 MED ORDER — FUROSEMIDE 10 MG/ML IJ SOLN
40.0000 mg | Freq: Once | INTRAMUSCULAR | Status: AC
  Administered 2021-10-08: 40 mg via INTRAVENOUS
  Filled 2021-10-08: qty 4

## 2021-10-08 MED ORDER — IOHEXOL 300 MG/ML  SOLN
80.0000 mL | Freq: Once | INTRAMUSCULAR | Status: AC | PRN
  Administered 2021-10-08: 80 mL via INTRAVENOUS

## 2021-10-08 MED ORDER — SODIUM CHLORIDE 0.9 % IV SOLN
2.0000 g | Freq: Three times a day (TID) | INTRAVENOUS | Status: DC
  Administered 2021-10-08 – 2021-10-09 (×3): 2 g via INTRAVENOUS
  Filled 2021-10-08 (×3): qty 2

## 2021-10-08 MED ORDER — FREE WATER
100.0000 mL | Freq: Four times a day (QID) | Status: DC
  Administered 2021-10-08 (×4): 100 mL

## 2021-10-08 NOTE — Progress Notes (Signed)
?  [facility] ? ? ?Reason for visit: Follow up on fever ? ?Interval History: he has developed a new fever up to 102.1 and leukocytosis up to 20.1.  family at bedside.  Off antibiotics now 3 days.  CXR with worsening aeration with pulmonary edema and multifocal opacities.   ? ? ?Physical Exam: ?Constitutional:  ?Vitals:  ? 10/08/21 0729 10/08/21 0800  ?BP:  135/70  ?Pulse:  (!) 116  ?Resp:  (!) 25  ?Temp:  99.1 ?F (37.3 ?C)  ?SpO2: 100% 98%  ? patient appears in NAD, alert ?Respiratory: Normal respiratory effort; + left lower rales ?Cardiovascular: RRR ? ? ?Review of Systems: ?Constitutional: negative for chills ? ?Lab Results  ?Component Value Date  ? WBC 20.1 (H) 10/08/2021  ? HGB 7.8 (L) 10/08/2021  ? HCT 24.3 (L) 10/08/2021  ? MCV 93.8 10/08/2021  ? PLT 290 10/08/2021  ?  ?Lab Results  ?Component Value Date  ? CREATININE 0.61 10/08/2021  ? BUN 43 (H) 10/08/2021  ? NA 148 (H) 10/08/2021  ? K 4.2 10/08/2021  ? CL 112 (H) 10/08/2021  ? CO2 30 10/08/2021  ?  ?Lab Results  ?Component Value Date  ? ALT 84 (H) 10/02/2021  ? AST 131 (H) 10/02/2021  ? ALKPHOS 180 (H) 10/02/2021  ?  ? ?Microbiology: ?Recent Results (from the past 240 hour(s))  ?Culture, Respiratory w Gram Stain     Status: None (Preliminary result)  ? Collection Time: 10/06/21  9:55 AM  ? Specimen: Tracheal Aspirate; Respiratory  ?Result Value Ref Range Status  ? Specimen Description TRACHEAL ASPIRATE  Final  ? Special Requests NONE  Final  ? Gram Stain   Final  ?  ABUNDANT WBC PRESENT, PREDOMINANTLY PMN ?RARE BUDDING YEAST SEEN ?  ? Culture   Final  ?  CULTURE REINCUBATED FOR BETTER GROWTH ?Performed at [facility], [place] [address], [place], [place] [address] ?  ? Report Status PENDING  Incomplete  ?Culture, blood (routine x 2)     Status: None (Preliminary result)  ? Collection Time: 10/07/21  8:25 AM  ? Specimen: BLOOD  ?Result Value Ref Range Status  ? Specimen Description BLOOD THUMB  Final  ? Special Requests    Final  ?  BOTTLES DRAWN AEROBIC ONLY Blood Culture results may not be optimal due to an inadequate volume of blood received in culture bottles  ? Culture   Final  ?  NO GROWTH <12 HOURS ?Performed at [facility], [place] [address], [place], [place] [address] ?  ? Report Status PENDING  Incomplete  ?Culture, blood (routine x 2)     Status: None (Preliminary result)  ? Collection Time: 10/07/21  8:36 AM  ? Specimen: BLOOD LEFT HAND  ?Result Value Ref Range Status  ? Specimen Description BLOOD LEFT HAND  Final  ? Special Requests   Final  ?  BOTTLES DRAWN AEROBIC ONLY Blood Culture adequate volume  ? Culture   Final  ?  NO GROWTH <12 HOURS ?Performed at [facility], [place] [address] [place], [place] [address] ?  ? Report Status PENDING  Incomplete  ? ? ?Impression/Plan:  ?1. Fever - he has a fever, remains on the vent and CXR with worsening findings including pulmonary edema and multifocal opacities concerning for new pneumonia vs evolving empyema.  I agree with CT scan which is ordered, cultures, which have been sent and will start cefepime for the possibility it is something  new vs the same Pneumococcus that has evolved.   ? ?2. Acute hypoxic and hypercapnic respiratory failure - now trach dependent for now and has remained on mechanical ventilation with PSV trials.   ?Will continue to monitor ? ?3.  Chronic hepatitis C - he has a positive viral load of 27 million and is genotype 1a.   He can get treatment as an outpatient at some point if indicated.  ? ?4.  Liver masses - multiple liver masses noted on CT scan in multiple lobes.  No obvious cirrhosis and his platelets are normal so is less likely to be hepatocellular carcinoma.  It may be metastatic disease but no primary area noted on CT of the c/a/p.   ?Will consider further work up when he is more stable.   ? ?  ?

## 2021-10-08 NOTE — TOC Progression Note (Signed)
Transition of Care (TOC) - Initial/Assessment Note  ? ? ?Patient Details  ?Name: Troy Sampson ?MRN: [id number] ?Date of Birth: Jun 01, 1956 ? ?Transition of Care (TOC) CM/SW Contact:    ?Catalina Pizza Siddalee Vanderheiden, LCSWA ?Phone Number: ?10/08/2021, 1:43 PM ? ?Clinical Narrative:                 ?CSW was contacted by Cassell Clement with [facility] and informed that that the patient's [facility] insurance has termed and the patient only has Medicare Part A which does not cover LTACH ? ?CSW contacted the patient's wife to inquire about insurance coverage. The patient's wife was unaware about whether th patient had any other insurance coverage outside of Medicare Part A.  The spouse reported that she would speak to the family and discuss possibly adding Medicare Part B to the patient's insurance.   ? ?Expected Discharge Plan: Long Term Nursing Home ?  ? ? ?Patient Goals and CMS Choice ?  ?  ?  ? ?Expected Discharge Plan and Services ?Expected Discharge Plan: Long Term Nursing Home ?  ?  ?  ?  ?                ?  ?  ?  ?  ?  ?  ?  ?  ?  ?  ? ?Prior Living Arrangements/Services ?  ?  ?  ?       ?  ?  ?  ?  ? ?Activities of Daily Living ?  ?  ? ?Permission Sought/Granted ?  ?  ?   ?   ?   ?   ? ?Emotional Assessment ?  ?  ?  ?  ?  ?  ? ?Admission diagnosis:  Acute respiratory failure with hypoxia (HCC) [J96.01] ?Community acquired pneumonia of left lower lobe of lung [J18.9] ?Sepsis, due to unspecified organism, unspecified whether acute organ dysfunction present (HCC) [A41.9] ?Acute respiratory failure (HCC) [J96.00] ?Patient Active Problem List  ? Diagnosis Date Noted  ? Status post tracheostomy (HCC)   ? Protein-calorie malnutrition, severe 10/01/2021  ? Pressure injury of skin 09/30/2021  ? Pneumonia, pneumococcal (HCC) 09/30/2021  ? Pneumococcal bacteremia 09/30/2021  ? Acute metabolic encephalopathy 09/30/2021  ? Alteration in electrolyte and fluid balance 09/30/2021  ? Hepatitis C 09/30/2021  ? Liver mass 09/30/2021  ?  Anemia 09/30/2021  ? NSTEMI (non-ST elevated myocardial infarction) (HCC) 09/30/2021  ? Thrombocytopenia (HCC) 09/30/2021  ? Acute respiratory failure (HCC) 09/22/2021  ? Protein calorie malnutrition (HCC) 09/22/2021  ? Acute respiratory failure with hypoxia (HCC)   ? Sepsis (HCC)   ? Community acquired pneumonia of left lower lobe of lung 09/21/2021  ? ?PCP:  Pcp, No ?Pharmacy:   ?[id number] [place], [place] - [address] ?[address] ?[place] [address] ?Phone: [phone] Fax: [phone] ? ? ? ? ?Social Determinants of Health (SDOH) Interventions ?  ? ?Readmission Risk Interventions ?No flowsheet data found. ? ? ?

## 2021-10-08 NOTE — Progress Notes (Signed)
Physical Therapy Treatment ?Patient Details ?Name: Troy Sampson ?MRN: [id number] ?DOB: [birth year] ?Today's Date: 10/08/2021 ? ? ?History of Present Illness Pt is a 66 y/o male who presented to the ED 2/22 with SOB. After arrival, pt became obtunded due to severe hypoxia and subsequently intubated emergently. Pt admitted with dx of acute hypoxic/hypercapnic respiratory failure 2nd to community acquired pneumonia.  Further testing revealed Hep C, suspected COPD, and potential underlying malignancy. Pt underwent tracheostomy 3/4. PMHx: duodenal ulcer, IDA, nephrolithiasis, ETOH ? ?  ?PT Comments  ? ? Pt making limited progress toward goals today. After requiring total A to come to EOB, pt able to sit with max posterior support for approximately 3 minutes before, pt requiring increased accessory muscle use for breathing. Returned to supine with total A. Pending progress d/c plans remain appropriate at this time. PT will continue to follow acutely.  ?   ?Recommendations for follow up therapy are one component of a multi-disciplinary discharge planning process, led by the attending physician.  Recommendations may be updated based on patient status, additional functional criteria and insurance authorization. ? ?Follow Up Recommendations ? Acute inpatient rehab (3hours/day) (pending progress) ?  ?  ?Assistance Recommended at Discharge Frequent or constant Supervision/Assistance  ?Patient can return home with the following Two people to help with walking and/or transfers;Two people to help with bathing/dressing/bathroom;Assistance with cooking/housework;Assistance with feeding;Direct supervision/assist for medications management;Direct supervision/assist for financial management;Assist for transportation;Help with stairs or ramp for entrance;Other (comment) (hoyer lift) ?  ?Equipment Recommendations ? Hospital bed;BSC/3in1;Wheelchair (measurements PT);Wheelchair cushion (measurements PT) (hoyer lift)  ?  ?Recommendations for  Other Services Rehab consult ? ? ?  ?Precautions / Restrictions Precautions ?Precautions: Other (comment) ?Precaution Comments: vent via trach, NG tube ?Restrictions ?Weight Bearing Restrictions: No  ?  ? ?Mobility ? Bed Mobility ?Overal bed mobility: Needs Assistance ?Bed Mobility: Supine to Sit, Sit to Supine ?  ?  ?Supine to sit: Total assist, +2 for physical assistance ?  ?  ?General bed mobility comments: assist for all aspects. cues for sequencing. decreased inititation. ?  ? ?Transfers ?  ?  ?  ?  ?  ?  ?  ?  ?  ?General transfer comment: deferred d/t increased WOB sitting EOB ?  ? ? ?  ? ? ?  ?Balance Overall balance assessment: Needs assistance ?Sitting-balance support: Bilateral upper extremity supported, Feet supported ?Sitting balance-Leahy Scale: Zero ?  ?  ?  ?  ?  ?  ?  ?  ?  ?  ?  ?  ?  ?  ?  ?  ?  ? ?  ?Cognition Arousal/Alertness: Lethargic ?Behavior During Therapy: Flat affect ?Overall Cognitive Status: Difficult to assess ?  ?  ?  ?  ?  ?  ?  ?  ?  ?  ?  ?  ?  ?  ?  ?  ?General Comments: lethargic. inconsistent one step command following. ?  ?  ? ?  ?Exercises Other Exercises ?Other Exercises: composite flexion/extension 2-3 reps each side, spouse educated on exercise ?Other Exercises: encouraged BLE movement, discussed this with spouse as well ? ?  ?General Comments General comments (skin integrity, edema, etc.): RT present in room at start of session. PEEP 8, 40% O2 with SpO2 100, HR 98 in supine at start of session ?  ?  ? ?Pertinent Vitals/Pain Pain Assessment ?Pain Assessment: Faces ?Faces Pain Scale: Hurts little more ?Pain Location: generalized with movement ?Pain Descriptors / Indicators: Grimacing, Discomfort ?Pain Intervention(s):  Limited activity within patient's tolerance, Monitored during session, Repositioned  ? ? ? ?PT Goals (current goals can now be found in the care plan section) Acute Rehab PT Goals ?Patient Stated Goal: unable to state ?PT Goal Formulation: Patient unable to  participate in goal setting ?Time For Goal Achievement: 10/17/21 ?Potential to Achieve Goals: Poor ?Progress towards PT goals: Not progressing toward goals - comment (increased lethargy) ? ?  ?Frequency ? ? ? Min 3X/week ? ? ? ?  ?PT Plan Discharge plan needs to be updated  ? ? ?Co-evaluation PT/OT/SLP Co-Evaluation/Treatment: Yes ?Reason for Co-Treatment: Complexity of the patient's impairments (multi-system involvement) ?PT goals addressed during session: Mobility/safety with mobility ?OT goals addressed during session: ADL's and self-care;Strengthening/ROM ?  ? ?  ?AM-PAC PT "6 Clicks" Mobility   ?Outcome Measure ? Help needed turning from your back to your side while in a flat bed without using bedrails?: Total ?Help needed moving from lying on your back to sitting on the side of a flat bed without using bedrails?: Total ?Help needed moving to and from a bed to a chair (including a wheelchair)?: Total ?Help needed standing up from a chair using your arms (e.g., wheelchair or bedside chair)?: Total ?Help needed to walk in hospital room?: Total ?Help needed climbing 3-5 steps with a railing? : Total ?6 Click Score: 6 ? ?  ?End of Session Equipment Utilized During Treatment: Oxygen (vent via trach) ?Activity Tolerance: Patient limited by lethargy;Patient limited by fatigue ?Patient left: with call bell/phone within reach;with chair alarm set;in bed;with bed alarm set;with nursing/sitter in room ?Nurse Communication: Mobility status ?PT Visit Diagnosis: Other abnormalities of gait and mobility (R26.89);Muscle weakness (generalized) (M62.81) ?  ? ? ?Time: 1340-1405 ?PT Time Calculation (min) (ACUTE ONLY): 25 min ? ?Charges:  $Therapeutic Activity: 8-22 mins          ?          ? ?Juwan Vences B. Beverely Risen PT, DPT ?Acute Rehabilitation Services ?Pager [phone] ?Office [phone] ? ? ? ?Elon Alas Fleet ?10/08/2021, 2:39 PM ? ?

## 2021-10-08 NOTE — Progress Notes (Incomplete)
Pharmacy Antibiotic Note  Troy Sampson is a 66 y.o. male admitted on 09/21/2021 with recurrent fevers, possible HCAP.  Pharmacy has been consulted for cefepime dosing.  WBC elevated 20.1, Tm 101F 3/10 SCr 0.61 - at baseline  Plan: Cefepime 2g IV every 8 hours Monitor renal function, CBC, cultures/sensitivities, LOT and de-escalate as able  Temp (24hrs), Avg:99.7 F (37.6 C), Min:97.9 F (36.6 C), Max:102.1 F (38.9 C)  Recent Labs  Lab 10/04/21 0414 10/04/21 1043 10/05/21 0553 10/06/21 0512 10/07/21 0518 10/08/21 0204  WBC 13.1* 15.2* 17.8* 18.7* 19.0* 20.1*  CREATININE 0.68  --  0.63 0.60* 0.73 0.61    Estimated Creatinine Clearance: 66.9 mL/min (by C-G formula based on SCr of 0.61 mg/dL).    No Known Allergies  Antimicrobials this admission: ***   Dose adjustments this admission: none  Microbiology results: 3/9 BCx: NGTD 3/8 TA: NGTD - reincubated  2/25 BCx: NGF  Thank you for allowing pharmacy to be a part of this patients care.  Filbert Schilder, PharmD PGY1 Pharmacy Resident 10/08/2021  9:25 AM  Please check [email or web address] for unit-specific pharmacy phone numbers.

## 2021-10-08 NOTE — Progress Notes (Signed)
? ?NAME:  Troy Sampson, MRN:  [id number], DOB:  May 15, 1956, LOS: 3 ?ADMISSION DATE:  09/21/2021, CONSULTATION DATE:  09/22/2021 ?REFERRING MD:  [facility], CHIEF COMPLAINT:  Shortness of Breath  ? ?History of Present Illness:  ?66 y/o male with a PMHx of duodenal ulcer, IDA, nephrolithiasis and a current smoker who presented from [facility] with c/o SOB x 1 week. History obtained by his wife due to patient arrived obtunded, markedly hypoxic oxygen sats in the low 80s. Patient placed on BiPAP and given Ativan. He was fairly tachypnis with RR in 37s as well as tachycardia and was ultimately intubated for acute hypoxic respiratory failure and increased work of breathing. Noted to have a fever of 101.8. CXR show a LLL pneumonia. PCCM consulted for ICU admission.  ? 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 ?Significant Hospital Events: ?Including procedures, antibiotic start and stop dates in addition to other pertinent events   ?2/22: Admitted overnight to ICU. Blood cultures 3/4 positive for strep pneumo. Strep pneumo antigen positive, TTE with EF 50 to 55% no wall motion abnormalities ?2/26: New onset fevers. Antibiotics broadened to Cefepime and Vancomycin. Propofol switched to Precedex.  ?2/27: Bradycardia noted on Precedex with increased tachypnea.  ?2/28, antibiotics changed to penicillin G per infectious disease recommendation ?3/1: Vasoactive drip requirements improving, received 1 unit of blood for hemoglobin of 6.1 ?3/2 follow-up hemoglobin 6.8, received second unit of blood, has also received 3 units of platelets since admission.  Off norepinephrine  ?3/3 EGD> grade D esophagitis with bleeding ?3/4 tracheostomy ?3/7 ongoing tachypnea limiting SBT. Unable to SBT at all this morning. Overnight episode of bradycardia when being turned requiring atropine ? ?Interim History / Subjective:  ?Patient denies any change overnight.  Denies  worsening shortness of breath or abdominal pain. ? ?Objective   ?Blood pressure (!) 144/72, pulse 84, temperature 99.4 ?F (37.4 ?C), temperature source Oral, resp. rate (!) 26, height 5\' 7"  (1.702 m), weight 51.4 kg, SpO2 100 %. ?   ?Vent Mode: PSV;CPAP ?FiO2 (%):  [40 %] 40 % ?Set Rate:  [24 bmp] 24 bmp ?Vt Set:  [500 mL] 500 mL ?PEEP:  [8 cmH20] 8 cmH20 ?Pressure Support:  [8 cmH20] 8 cmH20 ?Plateau Pressure:  [16 cmH20-22 cmH20] 16 cmH20  ? ?Intake/Output Summary (Last 24 hours) at 10/08/2021 0737 ?Last data filed at 10/08/2021 0600 ?Gross per 24 hour  ?Intake 2096 ml  ?Output 2850 ml  ?Net -754 ml  ? ? ?Filed Weights  ? 10/06/21 0452 10/07/21 0500 10/08/21 0500  ?Weight: 53.6 kg 54.9 kg 51.4 kg  ? ? ?Examination: ?General:   ill appearing male patient on mech vent ?HEENT: trach in place.  Does not appear in acute respiratory distress ?Neuro: Awake and alert, nonverbal ?CV: s1s2, RRR, no m/r/g ?PULM: No wheezing heard on anterior lungs ?GI: soft, bsx4 active  ?Extremities: warm/dry, +1 bilateral LE and UE edema ?Skin: no rashes or lesions   ? ?Resolved Hospital Problem list   ?Septic shock ?Lactic acidosis ?Acute urinary retention ?Thrombocytopenia ?Type 2 MI ?Urinary retention ? ?Assessment & Plan:  ?Acute hypoxic and hypercapnic respiratory failure ?Tracheostomy dependent for prolonged respiratory failure ?Probable COPD ?-continue mech vent PRVC ?-will check abg and adjust as needed ?-PSV trials as tolerated; consider TCT if doing well on PSV trials ?-wean fio2 for sats >92% ?-vap prevention place ?-CPT scheduled ?-trach care per protocol ? ?Severe sepsis 2/2 Strep pneumo CAP +  bacteremia; blood cultures cleared with treatment.  ?He spiked a fever of 102.9 overnight with trending up white blood count.  His antibiotics were stopped 2 days ago. ?Pending repeat blood culture.  UA looks clean. Chest x-ray concerning for worsening left infiltrates, questionable necrotizing pneumonia.  Will obtain chest CT  today ?-Appreciate ID recommendations ? ?Acute blood loss anemia ?Upper GI bleeding from esophagitis, grade D ?Hemoglobin trending down slowly.  No obvious acute bleeding at this time. ?-If hemoglobin continues to trend down, will reconsult GI ?-holding ASA and subcu heparin ?-SCDs for dvt prophylaxis ?-trend cbc and transfuse for hgb <7 or hemodynamically unstable ? ?Acute metabolic encephalopathy due to sepsis: improved ?Anxiety? ?-continue seroquel and oxy.  Switch Seroquel to bedtime only to avoid oversedation ?-prn fentanyl ? ?Severe protein calorie malnutrition, cachexia ?Hypernatremia ?-continue TF through cortrak ?-Added free water flushes ? ?Hypertension:  ?HLD ?-continue lopressor and Norvasc ?-prn hydralazine ?-cont statin ?  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 PSA minimally elevated so low suspicion for that being primary. AFP negative. Discussed pursuing IR biopsy in the future with family but given critical illness, patient is not currently stable enough. Suspect this is chronic given last IVDA occurred over 30 years ago. Viral load elevated at 27, 200,000.  ?-will need biopsy when more stable ? ?History of tobacco abuse ?-cont nicotine patch ?-will need smoking cessation counseling ?  ?Arm edema: dopplers largely negative but unable to visualize bilateral basilic and cephalic veins. More likely represents anasarca secondary to third spacing.  ?-1 dose of Lasix 40 mg ? ?Best Practice (right click and "Reselect all SmartList Selections" daily)  ? ?Diet/type: tubefeeds ?DVT prophylaxis: prophylactic heparin  ?GI prophylaxis: PPI ?Lines: N/A ?Foley:  N/A ?Code Status:  full code ?Last date of multidisciplinary goals of care discussion [ Daughter updated] ? ?Critical care time: ?  ? ?Gaylan Gerold, DO ?

## 2021-10-08 NOTE — Progress Notes (Signed)
RT NOTE: RT transported patient on ventilator from room 3M08 to CT and back to room 3M08 with no complications. Patient placed on full support for scan and then placed back on wean when back to room. Vitals are stable. RT will continue to monitor.  ?

## 2021-10-08 NOTE — Progress Notes (Signed)
Occupational Therapy Treatment ?Patient Details ?Name: Troy Sampson ?MRN: [id number] ?DOB: 1956/01/22 ?Today's Date: 10/08/2021 ? ? ?History of present illness Pt is a 66 y/o male who presented to the ED 2/22 with SOB. After arrival, pt became obtunded due to severe hypoxia and subsequently intubated emergently. Pt admitted with dx of acute hypoxic/hypercapnic respiratory failure 2nd to community acquired pneumonia.  Further testing revealed Hep C, suspected COPD, and potential underlying malignancy. Pt underwent tracheostomy 3/4. PMHx: duodenal ulcer, IDA, nephrolithiasis, ETOH ?  ?OT comments ? Pt making limited progress towards acute OT goals. Sat EOB with total assist for about 3 minutes before needing to return to supine d/t increased WOB and pt indicating increased discomfort. Spouse present throughout session. D/c recommendation remains appropriate for now.  ? ?Recommendations for follow up therapy are one component of a multi-disciplinary discharge planning process, led by the attending physician.  Recommendations may be updated based on patient status, additional functional criteria and insurance authorization. ?   ?Follow Up Recommendations ? Acute inpatient rehab (3hours/day)  ?  ?Assistance Recommended at Discharge Frequent or constant Supervision/Assistance  ?Patient can return home with the following ? Two people to help with walking and/or transfers;Two people to help with bathing/dressing/bathroom;Assistance with cooking/housework;Assistance with feeding;Direct supervision/assist for medications management;Direct supervision/assist for financial management;Assist for transportation;Help with stairs or ramp for entrance ?  ?Equipment Recommendations ? Other (comment) (TBD)  ?  ?Recommendations for Other Services   ? ?  ?Precautions / Restrictions Precautions ?Precautions: Other (comment) ?Precaution Comments: vent via trach, NG tube ?Restrictions ?Weight Bearing Restrictions: No  ? ? ?  ? ?Mobility Bed  Mobility ?Overal bed mobility: Needs Assistance ?Bed Mobility: Supine to Sit, Sit to Supine ?  ?  ?Supine to sit: Total assist, +2 for physical assistance ?  ?  ?General bed mobility comments: assist for all aspects. cues for sequencing. decreased inititation. ?  ? ?Transfers ?  ?  ?  ?  ?  ?  ?  ?  ?  ?General transfer comment: deferred d/t increased WOB sitting EOB ?  ?  ?Balance Overall balance assessment: Needs assistance ?Sitting-balance support: Bilateral upper extremity supported, Feet supported ?Sitting balance-Leahy Scale: Zero ?  ?  ?  ?  ?  ?  ?  ?  ?  ?  ?  ?  ?  ?  ?  ?  ?   ? ?ADL either performed or assessed with clinical judgement  ? ?ADL Overall ADL's : Needs assistance/impaired ?  ?  ?  ?  ?  ?  ?  ?  ?  ?  ?  ?  ?  ?  ?  ?  ?  ?  ?  ?General ADL Comments: Fair toleration sitting EOB with total assist. Cues for sequencing and hand placement EOB. Increased WOB, returned to supine after about 3 minutes EOB. ?  ? ?Extremity/Trunk Assessment Upper Extremity Assessment ?Upper Extremity Assessment: Generalized weakness;RUE deficits/detail;LUE deficits/detail (bilateral edema noted. able to complete 2 reps of composite flexion/extension AROM, otherwise no AROM observed or initiated by pt) ?  ?Lower Extremity Assessment ?Lower Extremity Assessment: Defer to PT evaluation ?  ?  ?  ? ?Vision   ?  ?  ?Perception   ?  ?Praxis   ?  ? ?Cognition Arousal/Alertness: Lethargic ?Behavior During Therapy: Flat affect ?Overall Cognitive Status: Difficult to assess ?  ?  ?  ?  ?  ?  ?  ?  ?  ?  ?  ?  ?  ?  ?  ?  ?  General Comments: lethargic. inconsistent one step command following. ?  ?  ?   ?Exercises Exercises: Other exercises ?Other Exercises ?Other Exercises: composite flexion/extension 2-3 reps each side, spouse educated on exercise ?Other Exercises: encouraged BLE movement, discussed this with spouse as well ? ?  ?Shoulder Instructions   ? ? ?  ?General Comments RT present in room at start of session. PEEP 8, 40%  O2 with SpO2 100, HR 98 in supine at start of session  ? ? ?Pertinent Vitals/ Pain       Pain Assessment ?Pain Assessment: Faces ?Faces Pain Scale: Hurts little more ?Pain Location: generalized with movement ?Pain Descriptors / Indicators: Grimacing, Discomfort ?Pain Intervention(s): Monitored during session, Limited activity within patient's tolerance, Repositioned ? ?Home Living   ?  ?  ?  ?  ?  ?  ?  ?  ?  ?  ?  ?  ?  ?  ?  ?  ?  ?  ? ?  ?Prior Functioning/Environment    ?  ?  ?  ?   ? ?Frequency ? Min 3X/week  ? ? ? ? ?  ?Progress Toward Goals ? ?OT Goals(current goals can now be found in the care plan section) ? Progress towards OT goals: Progressing toward goals (limited progress) ? ?Acute Rehab OT Goals ?Patient Stated Goal: pt indicating eagerness to mobilize. spouse open to AIR consult ?OT Goal Formulation: With patient/family ?Time For Goal Achievement: 10/20/21 ?Potential to Achieve Goals: Good ?ADL Goals ?Pt Will Perform Grooming: with max assist;sitting;bed level ?Pt Will Perform Upper Body Bathing: with max assist;bed level;sitting ?Pt Will Perform Lower Body Bathing: sitting/lateral leans;with mod assist ?Pt/caregiver will Perform Home Exercise Program: Increased ROM;Both right and left upper extremity;With minimal assist;With written HEP provided ?Additional ADL Goal #1: Pt will tolerate sitting EOB five minutes with mod A for balance.  ?Plan Discharge plan remains appropriate   ? ?Co-evaluation ? ? ? PT/OT/SLP Co-Evaluation/Treatment: Yes ?Reason for Co-Treatment: Complexity of the patient's impairments (multi-system involvement);For patient/therapist safety ?  ?OT goals addressed during session: ADL's and self-care;Strengthening/ROM ?  ? ?  ?AM-PAC OT "6 Clicks" Daily Activity     ?Outcome Measure ? ? Help from another person eating meals?: Total ?Help from another person taking care of personal grooming?: Total ?Help from another person toileting, which includes using toliet, bedpan, or urinal?:  Total ?Help from another person bathing (including washing, rinsing, drying)?: Total ?Help from another person to put on and taking off regular upper body clothing?: Total ?Help from another person to put on and taking off regular lower body clothing?: Total ?6 Click Score: 6 ? ?  ?End of Session Equipment Utilized During Treatment: Oxygen ? ?OT Visit Diagnosis: Other abnormalities of gait and mobility (R26.89);Unsteadiness on feet (R26.81);Muscle weakness (generalized) (M62.81);Feeding difficulties (R63.3);Other symptoms and signs involving the nervous system (R29.898);Other symptoms and signs involving cognitive function;Pain ?  ?Activity Tolerance Patient limited by lethargy;Patient limited by pain;Other (comment) (increased WOB sitting EOB with total assist) ?  ?Patient Left in bed;with call bell/phone within reach;with SCD's reapplied ?  ?Nurse Communication Mobility status;Need for lift equipment ?  ? ?   ? ?Time: [id number] ?OT Time Calculation (min): 25 min ? ?Charges: OT General Charges ?$OT Visit: 1 Visit ?OT Treatments ?$Self Care/Home Management : 8-22 mins ? ?Tyrone Schimke, OT ?Acute Rehabilitation Services ?Office: [phone] ? ? ?Tyrone Schimke H ?10/08/2021, 2:20 PM ?

## 2021-10-09 DIAGNOSIS — B182 Chronic viral hepatitis C: Secondary | ICD-10-CM

## 2021-10-09 LAB — CBC
HCT: 25.1 % — ABNORMAL LOW (ref 39.0–52.0)
Hemoglobin: 7.9 g/dL — ABNORMAL LOW (ref 13.0–17.0)
MCH: 30.3 pg (ref 26.0–34.0)
MCHC: 31.5 g/dL (ref 30.0–36.0)
MCV: 96.2 fL (ref 80.0–100.0)
Platelets: 140 10*3/uL — ABNORMAL LOW (ref 150–400)
RBC: 2.61 MIL/uL — ABNORMAL LOW (ref 4.22–5.81)
RDW: 17.1 % — ABNORMAL HIGH (ref 11.5–15.5)
WBC: 16.9 10*3/uL — ABNORMAL HIGH (ref 4.0–10.5)
nRBC: 0.1 % (ref 0.0–0.2)

## 2021-10-09 LAB — BASIC METABOLIC PANEL
Anion gap: 8 (ref 5–15)
BUN: 47 mg/dL — ABNORMAL HIGH (ref 8–23)
CO2: 30 mmol/L (ref 22–32)
Calcium: 8.4 mg/dL — ABNORMAL LOW (ref 8.9–10.3)
Chloride: 113 mmol/L — ABNORMAL HIGH (ref 98–111)
Creatinine, Ser: 0.56 mg/dL — ABNORMAL LOW (ref 0.61–1.24)
GFR, Estimated: >60 mL/min (ref >60)
Glucose, Bld: 140 mg/dL — ABNORMAL HIGH (ref 70–99)
Potassium: 3.9 mmol/L (ref 3.5–5.1)
Sodium: 151 mmol/L — ABNORMAL HIGH (ref 135–145)

## 2021-10-09 LAB — GLUCOSE, CAPILLARY
Glucose-Capillary: 121 mg/dL — ABNORMAL HIGH (ref 70–99)
Glucose-Capillary: 135 mg/dL — ABNORMAL HIGH (ref 70–99)
Glucose-Capillary: 136 mg/dL — ABNORMAL HIGH (ref 70–99)
Glucose-Capillary: 139 mg/dL — ABNORMAL HIGH (ref 70–99)
Glucose-Capillary: 145 mg/dL — ABNORMAL HIGH (ref 70–99)
Glucose-Capillary: 153 mg/dL — ABNORMAL HIGH (ref 70–99)
Glucose-Capillary: 164 mg/dL — ABNORMAL HIGH (ref 70–99)

## 2021-10-09 LAB — POCT I-STAT 7, (LYTES, BLD GAS, ICA,H+H)
Acid-Base Excess: 10 mmol/L — ABNORMAL HIGH (ref 0.0–2.0)
Bicarbonate: 33.8 mmol/L — ABNORMAL HIGH (ref 20.0–28.0)
Calcium, Ion: 1.31 mmol/L (ref 1.15–1.40)
HCT: 23 % — ABNORMAL LOW (ref 39.0–52.0)
Hemoglobin: 7.8 g/dL — ABNORMAL LOW (ref 13.0–17.0)
O2 Saturation: 94 %
Patient temperature: 98.1
Potassium: 3.8 mmol/L (ref 3.5–5.1)
Sodium: 155 mmol/L — ABNORMAL HIGH (ref 135–145)
TCO2: 35 mmol/L — ABNORMAL HIGH (ref 22–32)
pCO2 arterial: 43.4 mmHg (ref 32–48)
pH, Arterial: 7.498 — ABNORMAL HIGH (ref 7.35–7.45)
pO2, Arterial: 64 mmHg — ABNORMAL LOW (ref 83–108)

## 2021-10-09 LAB — PROCALCITONIN: Procalcitonin: 0.78 ng/mL

## 2021-10-09 LAB — LACTIC ACID, PLASMA: Lactic Acid, Venous: 1.5 mmol/L (ref 0.5–1.9)

## 2021-10-09 MED ORDER — ACETAMINOPHEN 160 MG/5ML PO SOLN
650.0000 mg | ORAL | Status: DC | PRN
  Administered 2021-10-10 (×2): 650 mg
  Filled 2021-10-09 (×2): qty 20.3

## 2021-10-09 MED ORDER — FREE WATER
200.0000 mL | Status: DC
  Administered 2021-10-09 – 2021-10-11 (×24): 200 mL

## 2021-10-09 MED ORDER — SODIUM CHLORIDE 0.9 % IV SOLN
2.0000 g | INTRAVENOUS | Status: DC
  Administered 2021-10-09: 2 g via INTRAVENOUS
  Filled 2021-10-09: qty 20

## 2021-10-09 MED ORDER — FENTANYL CITRATE (PF) 100 MCG/2ML IJ SOLN
12.5000 ug | INTRAMUSCULAR | Status: DC | PRN
  Administered 2021-10-14 – 2021-10-17 (×5): 12.5 ug via INTRAVENOUS
  Filled 2021-10-09 (×5): qty 2

## 2021-10-09 NOTE — Progress Notes (Addendum)
eLink Physician-Brief Progress Note ?Patient Name: Troy Sampson ?DOB: Feb 17, 1956 ?MRN: [id number] ? ? ?Date of Service ? 10/09/2021  ?HPI/Events of Note ? Notified of altered mental status.  ? ?Pt is a 65/M with acute hypoxic and hypercapnic respiratory failure and now has a trache in place.  Pt with fever and worsening findings on CXR, worsening leukocytosis.  ID recommended restarting cefepime.   ? ?Pt received his usual meds - klonopin and seroquel (oxycodone held as patient not in pain) but patient is unresponsive even with noxious stimuli.  He is otherwise hemodynamically stable.  ?eICU Interventions ? Get ABG stat.  Get AM labs now, including CBC, BMP, lactic acid, procalcitonin. ?Consider getting CT head.  ? ? ? ?Intervention Category ?Major Interventions: Change in mental status - evaluation and management ? ?Larinda Buttery ?10/09/2021, 1:03 AM ? ?1:32 AM ?ABG 7.498/43.4/64. ?Pt is trying to open his eyes now and more responsive to sternal rub.  ? ?Plan> ?Hold off on CT head.  ?Hold all sedating medications - seroquel, oxycodone and klonopin.  Decrease fentanyl to 12.5 PRN. ?Decrease minute ventilation to RR down to 18 from 24, increase Fio2 to 60%. ? ?1:54 AM ?Na at 155 from 151. Free water deficit is 2.1L to improve serum Na to 145 within 24 hours.  ? ?Plan> ?Increase free water to q2hrs.  ?

## 2021-10-09 NOTE — Progress Notes (Signed)
? ?NAME:  Troy Sampson, MRN:  [id number], DOB:  June 05, 1956, LOS: 17 ?ADMISSION DATE:  09/21/2021, CONSULTATION DATE:  09/22/2021 ?REFERRING MD:  [facility], CHIEF COMPLAINT:  Shortness of Breath  ? ?History of Present Illness:  ?66 y/o male with a PMHx of duodenal ulcer, IDA, nephrolithiasis and a current smoker who presented from [facility] with c/o SOB x 1 week. History obtained by his wife due to patient arrived obtunded, markedly hypoxic oxygen sats in the low 80s. Patient placed on BiPAP and given Ativan. He was fairly tachypnis with RR in 40s as well as tachycardia and was ultimately intubated for acute hypoxic respiratory failure and increased work of breathing. Noted to have a fever of 101.8. CXR show a LLL pneumonia. PCCM consulted for ICU admission.  ? 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 ?Significant Hospital Events: ?Including procedures, antibiotic start and stop dates in addition to other pertinent events   ?2/22: Admitted overnight to ICU. Blood cultures 3/4 positive for strep pneumo. Strep pneumo antigen positive, TTE with EF 50 to 55% no wall motion abnormalities ?2/26: New onset fevers. Antibiotics broadened to Cefepime and Vancomycin. Propofol switched to Precedex.  ?2/27: Bradycardia noted on Precedex with increased tachypnea.  ?2/28, antibiotics changed to penicillin G per infectious disease recommendation ?3/1: Vasoactive drip requirements improving, received 1 unit of blood for hemoglobin of 6.1 ?3/2 follow-up hemoglobin 6.8, received second unit of blood, has also received 3 units of platelets since admission.  Off norepinephrine  ?3/3 EGD> grade D esophagitis with bleeding ?3/4 tracheostomy ?3/7 ongoing tachypnea limiting SBT. Unable to SBT at all this morning. Overnight episode of bradycardia when being turned requiring atropine ? ?Interim History / Subjective:  ?Patient denies any change overnight.  He is more  somnolent this morning but not in acute distress. ? ?Objective   ?Blood pressure 134/69, pulse (!) 125, temperature (!) 101.6 ?F (38.7 ?C), temperature source Oral, resp. rate (!) 25, height 5\' 7"  (1.702 m), weight 50.7 kg, SpO2 100 %. ?   ?Vent Mode: PRVC ?FiO2 (%):  [40 %-60 %] 60 % ?Set Rate:  [18 bmp-24 bmp] 18 bmp ?Vt Set:  [500 mL] 500 mL ?PEEP:  [8 cmH20] 8 cmH20 ?Pressure Support:  [8 cmH20] 8 cmH20 ?Plateau Pressure:  [17 cmH20-20 cmH20] 20 cmH20  ? ?Intake/Output Summary (Last 24 hours) at 10/09/2021 0727 ?Last data filed at 10/09/2021 0600 ?Gross per 24 hour  ?Intake 1524.73 ml  ?Output 3850 ml  ?Net -2325.27 ml  ? ? ?Filed Weights  ? 10/07/21 0500 10/08/21 0500 10/09/21 0500  ?Weight: 54.9 kg 51.4 kg 50.7 kg  ? ? ?Examination: ?General:   ill appearing male patient on mech vent ?HEENT: trach in place.  Does not appear in acute respiratory distress ?Neuro: Somnolent but awakes to verbal stimulation ?CV: s1s2, RRR, no m/r/g ?PULM: No wheezing heard on anterior lungs ?GI: soft, bsx4 active  ?Extremities: warm/dry ?Skin: no rashes or lesions   ? ?Resolved Hospital Problem list   ?Septic shock ?Lactic acidosis ?Acute urinary retention ?Thrombocytopenia ?Type 2 MI ?Urinary retention ? ?Assessment & Plan:  ?Acute hypoxic and hypercapnic respiratory failure ?Tracheostomy dependent for prolonged respiratory failure ?Probable COPD ?-continue mech vent PRVC ?-will check abg and adjust as needed ?-PSV trials as tolerated; consider TCT if doing well on PSV trials ?-wean fio2 for sats >92% ?-vap prevention place ?-CPT scheduled ?-trach care per protocol ? ?SStrep pneumo CAP + bacteremia-finished  14 dpenicillin G ?New fever and leukocytosis ?Tmax of 101.3 overnight and white count trending down.  CT showed improved variation but persistent consolidation and atelectasis.  Bronchoscopy will not be able to clear mucous plugs distally. ?Blood culture negative to date.  Tracheal aspirate did not show any new  organism. ?-Continue cefepime ?-Appreciate ID recommendations ? ?Acute blood loss anemia ?Upper GI bleeding from esophagitis, grade D ?Hemoglobin stable today.  Elevated BUN likely secondary to upper GI bleed. ?-If hemoglobin continues to trend down, will reconsult GI ?-holding ASA and subcu heparin ?-SCDs for dvt prophylaxis ?-PPI 40 mg twice daily ?-trend cbc and transfuse for hgb <7 or hemodynamically unstable ? ?Acute metabolic encephalopathy due to sepsis ?Anxiety ?We will hold Seroquel and Klonopin.  Will resume slowly if needed ?-prn fentanyl ? ?Severe protein calorie malnutrition, cachexia ?Hypernatremia ?-continue TF through cortrak ?-Increase free water flushes ? ?Hypertension:  ?HLD ?-continue lopressor and Norvasc ?-prn hydralazine ?-cont statin ?  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 PSA minimally elevated so low suspicion for that being primary. AFP negative. Discussed pursuing IR biopsy in the future with family but given critical illness, patient is not currently stable enough. Suspect this is chronic given last IVDA occurred over 30 years ago. Viral load elevated at 27, 200,000.  ?-will need biopsy when more stable ? ?History of tobacco abuse ?-cont nicotine patch ?-will need smoking cessation counseling ?  ?Arm edema: dopplers largely negative but unable to visualize bilateral basilic and cephalic veins. More likely represents anasarca secondary to third spacing.  He is net negative accumulative.  We will hold Lasix ? ?Best Practice (right click and "Reselect all SmartList Selections" daily)  ? ?Diet/type: tubefeeds ?DVT prophylaxis: prophylactic heparin  ?GI prophylaxis: PPI ?Lines: N/A ?Foley:  N/A ?Code Status:  full code ?Last date of multidisciplinary goals of care discussion [ Daughter updated] ? ?Critical care  time: ?  ? ?Doran Stabler, DO ?

## 2021-10-09 NOTE — Progress Notes (Signed)
eLink Physician-Brief Progress Note ?Patient Name: Troy Sampson ?DOB: 03-22-1956 ?MRN: [id number] ? ? ?Date of Service ? 10/09/2021  ?HPI/Events of Note ? Fever. ID following and was changed to Ceftriaxone. RN asked to modify Tylenol frequency and place a cooling blanket for fever control  ?eICU Interventions ? Changed to q4 Tylenol dosing as needed ?Cooling blanket ordered ?Also ordered AM Liver enzymes. Last checked on the 4th and had mild elevation so will need follow up   ? ? ? ?Intervention Category ?Major Interventions: Respiratory failure - evaluation and management ? ?Brycelynn Stampley G Monteen Toops ?10/09/2021, 11:50 PM ?

## 2021-10-09 NOTE — Progress Notes (Signed)
?  [facility] ? ? ?Reason for visit: Follow up on pneumonia ? ?Interval History: up in chair, on trach collar.  WBC trending down, now 16.9; Tmax 101.6.  day 2 cefepime ?  ? ?Physical Exam: ?Constitutional:  ?Vitals:  ? 10/09/21 1053 10/09/21 1113  ?BP:    ?Pulse: 95   ?Resp: (!) 30   ?Temp:  99.2 ?F (37.3 ?C)  ?SpO2: 95%   ? patient appears in NAD; up in chair ?Eyes: anicteric ?HENT: + trach collar ?Respiratory: no respiratory distress ? ?Review of Systems: ?Integument/breast: negative for rash ? ?Lab Results  ?Component Value Date  ? WBC 16.9 (H) 10/09/2021  ? HGB 7.8 (L) 10/09/2021  ? HCT 23.0 (L) 10/09/2021  ? MCV 96.2 10/09/2021  ? PLT 140 (L) 10/09/2021  ?  ?Lab Results  ?Component Value Date  ? CREATININE 0.56 (L) 10/09/2021  ? BUN 47 (H) 10/09/2021  ? NA 155 (H) 10/09/2021  ? K 3.8 10/09/2021  ? CL 113 (H) 10/09/2021  ? CO2 30 10/09/2021  ?  ?Lab Results  ?Component Value Date  ? ALT 84 (H) 10/02/2021  ? AST 131 (H) 10/02/2021  ? ALKPHOS 180 (H) 10/02/2021  ?  ? ?Microbiology: ?Recent Results (from the past 240 hour(s))  ?Culture, Respiratory w Gram Stain     Status: None  ? Collection Time: 10/06/21  9:55 AM  ? Specimen: Tracheal Aspirate; Respiratory  ?Result Value Ref Range Status  ? Specimen Description TRACHEAL ASPIRATE  Final  ? Special Requests NONE  Final  ? Gram Stain   Final  ?  ABUNDANT WBC PRESENT, PREDOMINANTLY PMN ?RARE BUDDING YEAST SEEN ?  ? Culture   Final  ?  FEW CANDIDA TROPICALIS ?NO STAPHYLOCOCCUS AUREUS ISOLATED ?No Pseudomonas species isolated ?Performed at [facility], [address], [place], [place] [address] ?  ? Report Status 10/08/2021 FINAL  Final  ?Culture, blood (routine x 2)     Status: None (Preliminary result)  ? Collection Time: 10/07/21  8:25 AM  ? Specimen: BLOOD  ?Result Value Ref Range Status  ? Specimen Description BLOOD THUMB  Final  ? Special Requests   Final  ?  BOTTLES DRAWN AEROBIC ONLY Blood Culture results may not be optimal  due to an inadequate volume of blood received in culture bottles  ? Culture   Final  ?  NO GROWTH 2 DAYS ?Performed at [facility], [address], [place], [place] [address] ?  ? Report Status PENDING  Incomplete  ?Culture, blood (routine x 2)     Status: None (Preliminary result)  ? Collection Time: 10/07/21  8:36 AM  ? Specimen: BLOOD LEFT HAND  ?Result Value Ref Range Status  ? Specimen Description BLOOD LEFT HAND  Final  ? Special Requests   Final  ?  BOTTLES DRAWN AEROBIC ONLY Blood Culture adequate volume  ? Culture   Final  ?  NO GROWTH 2 DAYS ?Performed at [facility], [address], [place], [place] [address] ?  ? Report Status PENDING  Incomplete  ? ? ?Impression/Plan:  ?1. Pneumonia - he has had more fever up to 101, CT scan of the chest with significant for ground-glass density and consolidation and bronchiectasis with mucous plugging.  Culture from the trach aspirate noted and no Pseudomonas or STaph.  I do not feel the Candida is significant.  ?I will change him to ceftriaxone.  ? ?2.  Acute respiratory failure -  s/p tracheostomy and on a trach collar now with attempts at weaning.  ?Will continue to monitor.  ? ?3.  Thrombocytopenia - platelets down to 140 this am.  Previously have been wnl.  Can be related to cefepime but seems to quick for it to be cefepime-related with just starting it yesterday.  Will monitor trend.   ? ? ?  ?

## 2021-10-10 LAB — BLOOD CULTURE ID PANEL (REFLEXED) - BCID2

## 2021-10-10 LAB — CBC
HCT: 25.5 % — ABNORMAL LOW (ref 39.0–52.0)
Hemoglobin: 7.8 g/dL — ABNORMAL LOW (ref 13.0–17.0)
MCH: 30 pg (ref 26.0–34.0)
MCHC: 30.6 g/dL (ref 30.0–36.0)
MCV: 98.1 fL (ref 80.0–100.0)
Platelets: 373 10*3/uL (ref 150–400)
RBC: 2.6 MIL/uL — ABNORMAL LOW (ref 4.22–5.81)
RDW: 17.1 % — ABNORMAL HIGH (ref 11.5–15.5)
WBC: 26.5 10*3/uL — ABNORMAL HIGH (ref 4.0–10.5)
nRBC: 0.1 % (ref 0.0–0.2)

## 2021-10-10 LAB — GLUCOSE, CAPILLARY
Glucose-Capillary: 135 mg/dL — ABNORMAL HIGH (ref 70–99)
Glucose-Capillary: 136 mg/dL — ABNORMAL HIGH (ref 70–99)
Glucose-Capillary: 139 mg/dL — ABNORMAL HIGH (ref 70–99)
Glucose-Capillary: 139 mg/dL — ABNORMAL HIGH (ref 70–99)
Glucose-Capillary: 144 mg/dL — ABNORMAL HIGH (ref 70–99)
Glucose-Capillary: 152 mg/dL — ABNORMAL HIGH (ref 70–99)

## 2021-10-10 LAB — BASIC METABOLIC PANEL
Anion gap: 13 (ref 5–15)
BUN: 43 mg/dL — ABNORMAL HIGH (ref 8–23)
CO2: 24 mmol/L (ref 22–32)
Calcium: 7.9 mg/dL — ABNORMAL LOW (ref 8.9–10.3)
Chloride: 112 mmol/L — ABNORMAL HIGH (ref 98–111)
Creatinine, Ser: 0.72 mg/dL (ref 0.61–1.24)
GFR, Estimated: >60 mL/min (ref >60)
Glucose, Bld: 141 mg/dL — ABNORMAL HIGH (ref 70–99)
Potassium: 4.2 mmol/L (ref 3.5–5.1)
Sodium: 149 mmol/L — ABNORMAL HIGH (ref 135–145)

## 2021-10-10 LAB — HEPATIC FUNCTION PANEL
ALT: 96 U/L — ABNORMAL HIGH (ref 0–44)
AST: 158 U/L — ABNORMAL HIGH (ref 15–41)
Albumin: <1.5 g/dL — ABNORMAL LOW (ref 3.5–5.0)
Alkaline Phosphatase: 314 U/L — ABNORMAL HIGH (ref 38–126)
Bilirubin, Direct: 0.4 mg/dL — ABNORMAL HIGH (ref 0.0–0.2)
Indirect Bilirubin: 0.7 mg/dL (ref 0.3–0.9)
Total Bilirubin: 1.1 mg/dL (ref 0.3–1.2)
Total Protein: 6.5 g/dL (ref 6.5–8.1)

## 2021-10-10 LAB — PROCALCITONIN: Procalcitonin: 0.84 ng/mL

## 2021-10-10 MED ORDER — METOPROLOL TARTRATE 50 MG PO TABS
75.0000 mg | ORAL_TABLET | Freq: Two times a day (BID) | ORAL | Status: DC
  Administered 2021-10-10 – 2021-10-12 (×5): 75 mg
  Filled 2021-10-10 (×6): qty 1

## 2021-10-10 MED ORDER — SODIUM CHLORIDE 0.9 % IV SOLN
3.0000 g | Freq: Four times a day (QID) | INTRAVENOUS | Status: AC
  Administered 2021-10-10 – 2021-10-15 (×22): 3 g via INTRAVENOUS
  Filled 2021-10-10 (×23): qty 8

## 2021-10-10 MED ORDER — SODIUM CHLORIDE 0.9 % IV SOLN: INTRAVENOUS | Status: DC

## 2021-10-10 MED ORDER — HEPARIN SODIUM (PORCINE) 5000 UNIT/ML IJ SOLN
5000.0000 [IU] | Freq: Two times a day (BID) | INTRAMUSCULAR | Status: DC
  Administered 2021-10-10 – 2021-10-11 (×3): 5000 [IU] via SUBCUTANEOUS
  Filled 2021-10-10 (×3): qty 1

## 2021-10-10 MED ORDER — SODIUM CHLORIDE 0.9 % IV SOLN
INTRAVENOUS | Status: DC | PRN
  Administered 2021-10-10: 100 mL via INTRAVENOUS
  Administered 2021-10-21: 10 mL/h via INTRAVENOUS

## 2021-10-10 NOTE — Progress Notes (Signed)
?  [facility] ? ? ?Reason for visit: Follow up on pneumonia ? ?Interval History: in bed; WBC up to 26.5; Tmax 102.9; no acute events ?  ? ?Physical Exam: ?Constitutional:  ?Vitals:  ? 10/10/21 1200 10/10/21 1324  ?BP: 137/73   ?Pulse: 94   ?Resp: (!) 25   ?Temp:    ?SpO2: 94% 96%  ? patient appears in NAD; up in chair ?Eyes: anicteric ?HENT: + trach collar ?Respiratory: no respiratory distress ? ?Review of Systems: ?Integument/breast: negative for rash ? ?Lab Results  ?Component Value Date  ? WBC 26.5 (H) 10/10/2021  ? HGB 7.8 (L) 10/10/2021  ? HCT 25.5 (L) 10/10/2021  ? MCV 98.1 10/10/2021  ? PLT 373 10/10/2021  ?  ?Lab Results  ?Component Value Date  ? CREATININE 0.72 10/10/2021  ? BUN 43 (H) 10/10/2021  ? NA 149 (H) 10/10/2021  ? K 4.2 10/10/2021  ? CL 112 (H) 10/10/2021  ? CO2 24 10/10/2021  ?  ?Lab Results  ?Component Value Date  ? ALT 96 (H) 10/10/2021  ? AST 158 (H) 10/10/2021  ? ALKPHOS 314 (H) 10/10/2021  ?  ? ?Microbiology: ?Recent Results (from the past 240 hour(s))  ?Culture, Respiratory w Gram Stain     Status: None  ? Collection Time: 10/06/21  9:55 AM  ? Specimen: Tracheal Aspirate; Respiratory  ?Result Value Ref Range Status  ? Specimen Description TRACHEAL ASPIRATE  Final  ? Special Requests NONE  Final  ? Gram Stain   Final  ?  ABUNDANT WBC PRESENT, PREDOMINANTLY PMN ?RARE BUDDING YEAST SEEN ?  ? Culture   Final  ?  FEW CANDIDA TROPICALIS ?NO STAPHYLOCOCCUS AUREUS ISOLATED ?No Pseudomonas species isolated ?Performed at [facility], [address], [place], [place] [address] ?  ? Report Status 10/08/2021 FINAL  Final  ?Culture, blood (routine x 2)     Status: None (Preliminary result)  ? Collection Time: 10/07/21  8:25 AM  ? Specimen: BLOOD  ?Result Value Ref Range Status  ? Specimen Description BLOOD THUMB  Final  ? Special Requests   Final  ?  BOTTLES DRAWN AEROBIC ONLY Blood Culture results may not be optimal due to an inadequate volume of blood received in  culture bottles  ? Culture   Final  ?  NO GROWTH 3 DAYS ?Performed at [facility], [address], [place], [place] [address] ?  ? Report Status PENDING  Incomplete  ?Culture, blood (routine x 2)     Status: None (Preliminary result)  ? Collection Time: 10/07/21  8:36 AM  ? Specimen: BLOOD LEFT HAND  ?Result Value Ref Range Status  ? Specimen Description BLOOD LEFT HAND  Final  ? Special Requests   Final  ?  BOTTLES DRAWN AEROBIC ONLY Blood Culture adequate volume  ? Culture   Final  ?  NO GROWTH 3 DAYS ?Performed at [facility], [address] [place], [place] [address] ?  ? Report Status PENDING  Incomplete  ? ? ?Impression/Plan:  ?1. Pneumonia - he is here due to Streptococcal pneumonia and treated and now with fever and leukocytosis again.  Cultures from the trach aspirate not revealing but no Staph or Pseudomonas noted.  He has been getting fluid boluses and some bilateral opacifications on recent CT.  ? If he is aspirating and I will change him to ampicillin/sulbactam.   ? ?2.  Acute respiratory failure - continue with a trach and on vent.  Did stay on trach collar yesterday for 6 hours.  Continue to monitor.  ? ?3.  Thrombocytopenia - platelets back up to normal today.   ? ?4.  Leukocytosis - may be related to #1 or other etiology that is not clear at this time.  Will continue to monitor after change of antibiotics as above.   ? ? ?  ?

## 2021-10-10 NOTE — Progress Notes (Signed)
? ?NAME:  Troy Sampson, MRN:  [id number], DOB:  25-Nov-1955, LOS: 18 ?ADMISSION DATE:  09/21/2021, CONSULTATION DATE:  09/22/2021 ?REFERRING MD:  [facility], CHIEF COMPLAINT:  Shortness of Breath  ? ?History of Present Illness:  ?66 y/o male with a PMHx of duodenal ulcer, IDA, nephrolithiasis and a current smoker who presented from [facility] with c/o SOB x 1 week. History obtained by his wife due to patient arrived obtunded, markedly hypoxic oxygen sats in the low 80s. Patient placed on BiPAP and given Ativan. He was fairly tachypnis with RR in 40s as well as tachycardia and was ultimately intubated for acute hypoxic respiratory failure and increased work of breathing. Noted to have a fever of 101.8. CXR show a LLL pneumonia. PCCM consulted for ICU admission.  ? 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 ?Significant Hospital Events: ?Including procedures, antibiotic start and stop dates in addition to other pertinent events   ?2/22: Admitted overnight to ICU. Blood cultures 3/4 positive for strep pneumo. Strep pneumo antigen positive, TTE with EF 50 to 55% no wall motion abnormalities ?2/26: New onset fevers. Antibiotics broadened to Cefepime and Vancomycin. Propofol switched to Precedex.  ?2/27: Bradycardia noted on Precedex with increased tachypnea.  ?2/28, antibiotics changed to penicillin G per infectious disease recommendation ?3/1: Vasoactive drip requirements improving, received 1 unit of blood for hemoglobin of 6.1 ?3/2 follow-up hemoglobin 6.8, received second unit of blood, has also received 3 units of platelets since admission.  Off norepinephrine  ?3/3 EGD> grade D esophagitis with bleeding ?3/4 tracheostomy ?3/7 ongoing tachypnea limiting SBT. Unable to SBT at all this morning. Overnight episode of bradycardia when being turned requiring atropine ? ?Interim History / Subjective:  ?Patient was placed on trach collar yesterday,  tolerated for 6 hours before getting tired out ?Started spiking fever again, Tmax 102 ?White count is trending up ? ?Objective   ?Blood pressure 140/88, pulse (!) 115, temperature (!) 100.5 ?F (38.1 ?C), temperature source Oral, resp. rate (!) 31, height 5\' 7"  (1.702 m), weight 52.9 kg, SpO2 96 %. ?   ?Vent Mode: PRVC ?FiO2 (%):  [40 %] 40 % ?Set Rate:  [18 bmp] 18 bmp ?Vt Set:  [500 mL] 500 mL ?PEEP:  [8 cmH20] 8 cmH20 ?Plateau Pressure:  [19 cmH20-24 cmH20] 19 cmH20  ? ?Intake/Output Summary (Last 24 hours) at 10/10/2021 0818 ?Last data filed at 10/10/2021 0800 ?Gross per 24 hour  ?Intake 4435 ml  ?Output 2450 ml  ?Net 1985 ml  ? ?Filed Weights  ? 10/08/21 0500 10/09/21 0500 10/10/21 0325  ?Weight: 51.4 kg 50.7 kg 52.9 kg  ? ? ?  ?Physical exam: ?General: Crtitically ill-appearing male, s/p trach ?HEENT: [place]/AT, eyes anicteric. Cortrak in place ?Neuro: Opens eyes with vocal stimuli, intermittently following commands ?Chest: Bilateral basal crackles left more than right, no wheezes or rhonchi ?Heart: Tachycardic, regular rhythm, no murmurs or gallops ?Abdomen: Soft, nontender, nondistended, bowel sounds present ?Skin: No rash ? ?Resolved Hospital Problem list   ?Septic shock ?Lactic acidosis ?Acute urinary retention ?Thrombocytopenia ?Type 2 MI ?Urinary retention ? ?Assessment & Plan:  ?Acute hypoxic and hypercapnic respiratory failure s/p trach ?Probable COPD ?Continue lung protective ventilation ?We will try SBT again, then will place him on trach collar ?He tolerated 6-hour trach collar yesterday ?vap prevention place ?CPT scheduled ?Continue trach care per protocol ? ?Severe sepsis due to Strep pneumo CAP + bacteremia-finished 14 dpenicillin G ?New fever and  leukocytosis with recurrence/persistence of pneumonia ?Patient continued to spike fever now Tmax is 102 ?His white count trended up to 26,000 ?His respiratory cultures showing Candida, likely colonization ?ID recommends switching antibiotic from cefepime to  ceftriaxone ?Chest CT confirmed persistent consolidation and atelectasis ?Appreciate ID recommendations ? ?Acute on chronic blood loss anemia ?Upper GI bleeding from esophagitis, grade D ?Hemoglobin stable today.  Elevated BUN likely secondary to upper GI bleed. ?If hemoglobin continues to trend down, will reconsult GI ?Restart subcu heparin for DVT prophylaxis ?Hold aspirin ?Continue PPI 40 mg twice daily ? ?Acute metabolic/septic encephalopathy  ?Mental status has improved ?Continue prn fentanyl ? ?Severe protein calorie malnutrition, cachexia ?Hypernatremia ?Continue TF through cortrak ?Continue free water flushes, serum sodium is trending down from 155-149 ? ?Hypertension:  ?HLDX ?Continue lopressor and Norvasc ?prn hydralazine ?cont statin ?  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 PSA minimally elevated so low suspicion for that being primary. AFP negative. Discussed pursuing IR biopsy in the future with family but given critical illness, patient is not currently stable enough. Suspect this is chronic given last IVDA occurred over 30 years ago. Viral load elevated at 27, 200,000.  ?LFTs are elevated ?Holding Tylenol ? ?Tobacco dependence ?Continue nicotine patch ?Smoking cessation counseling provided ? ? ?Best Practice (right click and "Reselect all SmartList Selections" daily)  ? ?Diet/type: tubefeeds ?DVT prophylaxis: prophylactic heparin  ?GI prophylaxis: PPI ?Lines: N/A ?Foley:  N/A ?Code Status:  full code ?Last date of multidisciplinary goals of care discussion [ Daughter updated] ? ?Critical care time: ?  ? ?Total critical care time: 33 minutes ? ?Performed by: Cheri Fowler ?  ?Critical care time was exclusive of separately billable procedures and treating other patients. ?  ?Critical care was necessary to treat or prevent  imminent or life-threatening deterioration. ?  ?Critical care was time spent personally by me on the following activities: development of treatment plan with patient and/or surrogate as well as nursing, discussions with consultants, evaluation of patient's response to treatment, examination of patient, obtaining history from patient or surrogate, ordering and performing treatments and interventions, ordering and review of laboratory studies, ordering and review of radiographic studies, pulse oximetry and re-evaluation of patient's condition. ?  ?Cheri Fowler MD ?[facility] ?See Amion for pager ?If no response to pager, please call [phone] until 7pm ?After 7pm, Please call E-link [phone] ? ?

## 2021-10-10 NOTE — Progress Notes (Signed)
? ? ?  [facility] has been requested to perform a transesophageal echocardiogram on 10/11/21 for rule out endocarditis.  After careful review of history and examination, the risks and benefits of transesophageal echocardiogram have been explained to his wife, Gaige Behrmann via telephone, including risks of esophageal damage, perforation (1:10,000 risk), bleeding, pharyngeal hematoma as well as other potential complications associated with conscious sedation including aspiration, arrhythmia, respiratory failure and death. Alternatives to treatment were discussed, questions were answered. Patient is willing to proceed. Lilia Pro, RN provided witness ? ?TEE tentatively scheduled for 10/11/21 at 1pm with Dr. Gardiner Rhyme at bedside.  ? ?Roby Lofts, PA-C ?10/10/2021 5:20 PM  ? ?

## 2021-10-10 NOTE — Progress Notes (Signed)
Pharmacy Antibiotic Note ? ?Troy Sampson is a 65 y.o. male admitted on 09/21/2021 with recurrent fevers, possible HCAP.  Pharmacy has been consulted for Unasyn dosing. ? ?WBC elevated 26.5, Tm 102.9 ?SCr 0.61 - at baseline ? ?Plan: ?Unasyn 3gm q6hr per ID ?Monitor renal function, CBC, cultures/sensitivities, LOT and de-escalate as able ? ?Temp (24hrs), Avg:100.8 ?F (38.2 ?C), Min:98.4 ?F (36.9 ?C), Max:102.9 ?F (39.4 ?C) ? ?Recent Labs  ?Lab 10/06/21 ?0254 10/07/21 ?0518 10/08/21 ?2706 10/09/21 ?0102 10/10/21 ?2376 10/10/21 ?2831  ?WBC 18.7* 19.0* 20.1* 16.9*  --  26.5*  ?CREATININE 0.60* 0.73 0.61 0.56* 0.72  --   ?LATICACIDVEN  --   --   --  1.5  --   --   ? ?  ?Estimated Creatinine Clearance: 68.9 mL/min (by C-G formula based on SCr of 0.72 mg/dL).   ? ?No Known Allergies ? ?Thank you for allowing pharmacy to be a part of this patient?s care. ? ?Tomie China, PharmD ?Clinical Pharmacist ? ?Please check AMION for all [facility] Pharmacy numbers ?After 10:00 PM, call Main Pharmacy [phone] ? ? ?

## 2021-10-11 ENCOUNTER — Encounter (HOSPITAL_COMMUNITY)
Admission: EM | Disposition: A | Discharge status: Nursing Home (Includes ICF) | Payer: Self-pay | Source: Home / Self Care | Attending: Internal Medicine

## 2021-10-11 ENCOUNTER — Inpatient Hospital Stay (HOSPITAL_COMMUNITY): Payer: Medicare Other

## 2021-10-11 ENCOUNTER — Other Ambulatory Visit (HOSPITAL_COMMUNITY): Payer: Self-pay

## 2021-10-11 LAB — CBC
HCT: 25 % — ABNORMAL LOW (ref 39.0–52.0)
Hemoglobin: 7.9 g/dL — ABNORMAL LOW (ref 13.0–17.0)
MCH: 30.7 pg (ref 26.0–34.0)
MCHC: 31.6 g/dL (ref 30.0–36.0)
MCV: 97.3 fL (ref 80.0–100.0)
Platelets: 402 10*3/uL — ABNORMAL HIGH (ref 150–400)
RBC: 2.57 MIL/uL — ABNORMAL LOW (ref 4.22–5.81)
RDW: 16.5 % — ABNORMAL HIGH (ref 11.5–15.5)
WBC: 33 10*3/uL — ABNORMAL HIGH (ref 4.0–10.5)
nRBC: 0.2 % (ref 0.0–0.2)

## 2021-10-11 LAB — COMPREHENSIVE METABOLIC PANEL
ALT: 92 U/L — ABNORMAL HIGH (ref 0–44)
AST: 117 U/L — ABNORMAL HIGH (ref 15–41)
Albumin: <1.5 g/dL — ABNORMAL LOW (ref 3.5–5.0)
Alkaline Phosphatase: 281 U/L — ABNORMAL HIGH (ref 38–126)
Anion gap: 9 (ref 5–15)
BUN: 32 mg/dL — ABNORMAL HIGH (ref 8–23)
CO2: 31 mmol/L (ref 22–32)
Calcium: 8.4 mg/dL — ABNORMAL LOW (ref 8.9–10.3)
Chloride: 113 mmol/L — ABNORMAL HIGH (ref 98–111)
Creatinine, Ser: 0.5 mg/dL — ABNORMAL LOW (ref 0.61–1.24)
GFR, Estimated: >60 mL/min (ref >60)
Glucose, Bld: 99 mg/dL (ref 70–99)
Potassium: 3 mmol/L — ABNORMAL LOW (ref 3.5–5.1)
Sodium: 153 mmol/L — ABNORMAL HIGH (ref 135–145)
Total Bilirubin: 0.8 mg/dL (ref 0.3–1.2)
Total Protein: 6.9 g/dL (ref 6.5–8.1)

## 2021-10-11 LAB — CULTURE, BLOOD (ROUTINE X 2): Special Requests: ADEQUATE

## 2021-10-11 LAB — GLUCOSE, CAPILLARY
Glucose-Capillary: 120 mg/dL — ABNORMAL HIGH (ref 70–99)
Glucose-Capillary: 129 mg/dL — ABNORMAL HIGH (ref 70–99)
Glucose-Capillary: 139 mg/dL — ABNORMAL HIGH (ref 70–99)
Glucose-Capillary: 144 mg/dL — ABNORMAL HIGH (ref 70–99)
Glucose-Capillary: 79 mg/dL (ref 70–99)
Glucose-Capillary: 96 mg/dL (ref 70–99)

## 2021-10-11 LAB — MAGNESIUM: Magnesium: 2 mg/dL (ref 1.7–2.4)

## 2021-10-11 LAB — PROCALCITONIN: Procalcitonin: 0.73 ng/mL

## 2021-10-11 SURGERY — ECHOCARDIOGRAM, TRANSESOPHAGEAL
Anesthesia: Monitor Anesthesia Care

## 2021-10-11 MED ORDER — POTASSIUM CHLORIDE 20 MEQ PO PACK
40.0000 meq | PACK | Freq: Once | ORAL | Status: AC
  Administered 2021-10-11: 40 meq
  Filled 2021-10-11: qty 2

## 2021-10-11 MED ORDER — FREE WATER
300.0000 mL | Status: DC
  Administered 2021-10-11 – 2021-10-12 (×10): 300 mL

## 2021-10-11 MED ORDER — POTASSIUM CHLORIDE 10 MEQ/100ML IV SOLN
10.0000 meq | INTRAVENOUS | Status: AC
  Administered 2021-10-11 (×4): 10 meq via INTRAVENOUS
  Filled 2021-10-11 (×4): qty 100

## 2021-10-11 NOTE — Progress Notes (Signed)
Physical Therapy Treatment ?Patient Details ?Name: Troy Sampson ?MRN: [id number] ?DOB: 19-Aug-1955 ?Today's Date: 10/11/2021 ? ? ?History of Present Illness Pt is a 66 y/o male who presented to the ED 2/22 with SOB. After arrival, pt became obtunded due to severe hypoxia and subsequently intubated emergently. Pt admitted with dx of acute hypoxic/hypercapnic respiratory failure 2nd to community acquired pneumonia.  Further testing revealed Hep C, suspected COPD, and potential underlying malignancy. Pt underwent tracheostomy 3/4. PMHx: duodenal ulcer, IDA, nephrolithiasis, ETOH ? ?  ?PT Comments  ? ? Pt is much more alert on entry and agreeable to out of bed. Daughter arrives as room is being set up and provides increased encouragement throughout. Pt with improved command follow and initiation but ultimately requires total A for bed mobility and sit <>stand from the [place] x2. Pt continues to have increased work of breathing and respiratory rate in 40s with movement. Cued for slowed deep breathing throughout. Pt obviously exhausted at end of session. D/c plans remain appropriate at this time. PT will continue to follow acutely. ?   ?Recommendations for follow up therapy are one component of a multi-disciplinary discharge planning process, led by the attending physician.  Recommendations may be updated based on patient status, additional functional criteria and insurance authorization. ? ?Follow Up Recommendations ? Acute inpatient rehab (3hours/day) (pending progress) ?  ?  ?Assistance Recommended at Discharge Frequent or constant Supervision/Assistance  ?Patient can return home with the following Two people to help with walking and/or transfers;Two people to help with bathing/dressing/bathroom;Assistance with cooking/housework;Assistance with feeding;Direct supervision/assist for medications management;Direct supervision/assist for financial management;Assist for transportation;Help with stairs or ramp for entrance;Other  (comment) (hoyer lift) ?  ?Equipment Recommendations ? Hospital bed;BSC/3in1;Wheelchair (measurements PT);Wheelchair cushion (measurements PT) (hoyer lift)  ?  ?Recommendations for Other Services Rehab consult ? ? ?  ?Precautions / Restrictions Precautions ?Precautions: Other (comment) ?Precaution Comments: vent via trach, NG tube ?Restrictions ?Weight Bearing Restrictions: No  ?  ? ?Mobility ? Bed Mobility ?Overal bed mobility: Needs Assistance ?Bed Mobility: Supine to Sit, Sit to Supine ?  ?  ?Supine to sit: Total assist, +2 for physical assistance ?  ?  ?General bed mobility comments: pt able to activate all 4 exteremities more in today's session, however he still requires total A for to come to sitting EoB ?  ? ?Transfers ?Overall transfer level: Needs assistance ?  ?Transfers: Bed to chair/wheelchair/BSC, Sit to/from Stand ?Sit to Stand: Total assist, From elevated surface, +2 physical assistance ?  ?  ?  ?  ?  ?General transfer comment: pt UE guided to [facility] bar where he was able to maintain hold, pt does have some activation but ultimately requires totalAx2 for coming to standing in [place] and for coming to standing from [place] pads. ?Transfer via Lift Equipment: [place] ? ?Ambulation/Gait ?  ?  ?  ?  ?  ?  ?  ?General Gait Details: unable ? ? ? ?  ? ? ?  ?Balance Overall balance assessment: Needs assistance ?Sitting-balance support: Bilateral upper extremity supported, Feet supported ?Sitting balance-Leahy Scale: Poor ?Sitting balance - Comments: has more core activation but still requires outside support to maintain balance ?  ?  ?  ?  ?  ?  ?  ?  ?  ?  ?  ?  ?  ?  ?  ?  ? ?  ?Cognition Arousal/Alertness: Lethargic ?Behavior During Therapy: Flat affect ?Overall Cognitive Status: Difficult to assess ?  ?  ?  ?  ?  ?  ?  ?  ?  ?  ?  ?  ?  ?  ?  ?  ?  General Comments: improved alertness, daughter in room providing encouragement,  inconsistent one step command following. ?  ?  ? ?  ?   ?General Comments General  comments (skin integrity, edema, etc.): max HR noted 135 bpm, RR 44 with activity, cues for slowing breath ?  ?  ? ?Pertinent Vitals/Pain Pain Assessment ?Pain Assessment: Faces ?Faces Pain Scale: Hurts little more ?Pain Location: generalized with movement ?Pain Descriptors / Indicators: Grimacing, Discomfort ?Pain Intervention(s): Limited activity within patient's tolerance, Monitored during session, Repositioned  ? ? ? ?PT Goals (current goals can now be found in the care plan section) Acute Rehab PT Goals ?Patient Stated Goal: unable to state ?PT Goal Formulation: Patient unable to participate in goal setting ?Time For Goal Achievement: 10/17/21 ?Potential to Achieve Goals: Poor ? ?  ?Frequency ? ? ? Min 3X/week ? ? ? ?  ?PT Plan Discharge plan needs to be updated  ? ? ?Co-evaluation PT/OT/SLP Co-Evaluation/Treatment: Yes ?Reason for Co-Treatment: For patient/therapist safety ?PT goals addressed during session: Mobility/safety with mobility ?  ?  ? ?  ?AM-PAC PT "6 Clicks" Mobility   ?Outcome Measure ? Help needed turning from your back to your side while in a flat bed without using bedrails?: Total ?Help needed moving from lying on your back to sitting on the side of a flat bed without using bedrails?: Total ?Help needed moving to and from a bed to a chair (including a wheelchair)?: Total ?Help needed standing up from a chair using your arms (e.g., wheelchair or bedside chair)?: Total ?Help needed to walk in hospital room?: Total ?Help needed climbing 3-5 steps with a railing? : Total ?6 Click Score: 6 ? ?  ?End of Session Equipment Utilized During Treatment: Oxygen (vent via trach) ?Activity Tolerance: Patient limited by lethargy;Patient limited by fatigue ?Patient left: with call bell/phone within reach;with chair alarm set;in bed;with bed alarm set;with nursing/sitter in room ?Nurse Communication: Mobility status ?PT Visit Diagnosis: Other abnormalities of gait and mobility (R26.89);Muscle weakness  (generalized) (M62.81) ?  ? ? ?Time: 1610-9604 ?PT Time Calculation (min) (ACUTE ONLY): 29 min ? ?Charges:  $Therapeutic Activity: 8-22 mins          ?          ? ?Denitra Donaghey B. Beverely Risen PT, DPT ?Acute Rehabilitation Services ?Pager [phone] ?Office [phone] ? ? ? ?Elon Alas Fleet ?10/11/2021, 3:22 PM ? ?

## 2021-10-11 NOTE — Progress Notes (Signed)
SLP Cancellation Note ? ?Patient Details ?Name: Troy Sampson ?MRN: [id number] ?DOB: 1956-06-28 ? ? ?Cancelled treatment:       Reason Eval/Treat Not Completed: Patient not medically ready. No ATC trials today ? ? ?Angelys Yetman, Riley Nearing ?10/11/2021, 2:02 PM ?

## 2021-10-11 NOTE — Progress Notes (Signed)
?  [facility] ? ? ?Reason for visit: Follow up on pneumonia ? ?Interval History: fever curve trending down, WBC up to 33.  He is up in a chair again today.  Day 2 ampicillin/sulbactam;' some minimal improvement in his CXR.  Left base consolidation.   ?  ? ?Physical Exam: ?Constitutional:  ?Vitals:  ? 10/11/21 1130 10/11/21 1131  ?BP: 133/71   ?Pulse: 83   ?Resp: (!) 36   ?Temp:    ?SpO2: 100% 100%  ? patient appears in NAD ?Eyes: anicteric ?HENT: + trach ?Respiratory: some increased respiratory effort on CPAP ?Cardiovascular: RRR ? ?Review of Systems: ?Unable to be assessed due to patient factors ? ?Lab Results  ?Component Value Date  ? WBC 33.0 (H) 10/11/2021  ? HGB 7.9 (L) 10/11/2021  ? HCT 25.0 (L) 10/11/2021  ? MCV 97.3 10/11/2021  ? PLT 402 (H) 10/11/2021  ?  ?Lab Results  ?Component Value Date  ? CREATININE 0.50 (L) 10/11/2021  ? BUN 32 (H) 10/11/2021  ? NA 153 (H) 10/11/2021  ? K 3.0 (L) 10/11/2021  ? CL 113 (H) 10/11/2021  ? CO2 31 10/11/2021  ?  ?Lab Results  ?Component Value Date  ? ALT 92 (H) 10/11/2021  ? AST 117 (H) 10/11/2021  ? ALKPHOS 281 (H) 10/11/2021  ?  ? ?Microbiology: ?Recent Results (from the past 240 hour(s))  ?Culture, Respiratory w Gram Stain     Status: None  ? Collection Time: 10/06/21  9:55 AM  ? Specimen: Tracheal Aspirate; Respiratory  ?Result Value Ref Range Status  ? Specimen Description TRACHEAL ASPIRATE  Final  ? Special Requests NONE  Final  ? Gram Stain   Final  ?  ABUNDANT WBC PRESENT, PREDOMINANTLY PMN ?RARE BUDDING YEAST SEEN ?  ? Culture   Final  ?  FEW CANDIDA TROPICALIS ?NO STAPHYLOCOCCUS AUREUS ISOLATED ?No Pseudomonas species isolated ?Performed at [facility], [address] [place], [place] [address] ?  ? Report Status 10/08/2021 FINAL  Final  ?Culture, blood (routine x 2)     Status: None (Preliminary result)  ? Collection Time: 10/07/21  8:25 AM  ? Specimen: BLOOD  ?Result Value Ref Range Status  ? Specimen Description BLOOD THUMB   Final  ? Special Requests   Final  ?  BOTTLES DRAWN AEROBIC ONLY Blood Culture results may not be optimal due to an inadequate volume of blood received in culture bottles  ? Culture   Final  ?  NO GROWTH 4 DAYS ?Performed at [facility], [address] [place], [place] [address] ?  ? Report Status PENDING  Incomplete  ?Culture, blood (routine x 2)     Status: None (Preliminary result)  ? Collection Time: 10/07/21  8:36 AM  ? Specimen: BLOOD LEFT HAND  ?Result Value Ref Range Status  ? Specimen Description BLOOD LEFT HAND  Final  ? Special Requests   Final  ?  BOTTLES DRAWN AEROBIC ONLY Blood Culture adequate volume ?Performed at [facility], [address], [place], [place] [address] ?  ? Culture  Setup Time GRAM POSITIVE COCCI ?AEROBIC BOTTLE ONLY ?  Final  ? Culture GRAM POSITIVE COCCI  Final  ? Report Status PENDING  Incomplete  ?Blood Culture ID Panel (Reflexed)     Status: None  ? Collection Time: 10/07/21  8:36 AM  ?Result Value Ref Range Status  ? Enterococcus faecalis NOT DETECTED NOT DETECTED Final  ? Enterococcus Faecium NOT DETECTED  NOT DETECTED Final  ? Listeria monocytogenes NOT DETECTED NOT DETECTED Final  ? Staphylococcus species NOT DETECTED NOT DETECTED Final  ? Staphylococcus aureus (BCID) NOT DETECTED NOT DETECTED Final  ? Staphylococcus epidermidis NOT DETECTED NOT DETECTED Final  ? Staphylococcus lugdunensis NOT DETECTED NOT DETECTED Final  ? Streptococcus species NOT DETECTED NOT DETECTED Final  ? Streptococcus agalactiae NOT DETECTED NOT DETECTED Final  ? Streptococcus pneumoniae NOT DETECTED NOT DETECTED Final  ? Streptococcus pyogenes NOT DETECTED NOT DETECTED Final  ? A.calcoaceticus-baumannii NOT DETECTED NOT DETECTED Final  ? Bacteroides fragilis NOT DETECTED NOT DETECTED Final  ? Enterobacterales NOT DETECTED NOT DETECTED Final  ? Enterobacter cloacae complex NOT DETECTED NOT DETECTED Final  ? Escherichia coli NOT DETECTED NOT DETECTED Final  ? Klebsiella aerogenes NOT  DETECTED NOT DETECTED Final  ? Klebsiella oxytoca NOT DETECTED NOT DETECTED Final  ? Klebsiella pneumoniae NOT DETECTED NOT DETECTED Final  ? Proteus species NOT DETECTED NOT DETECTED Final  ? Salmonella species NOT DETECTED NOT DETECTED Final  ? Serratia marcescens NOT DETECTED NOT DETECTED Final  ? Haemophilus influenzae NOT DETECTED NOT DETECTED Final  ? Neisseria meningitidis NOT DETECTED NOT DETECTED Final  ? Pseudomonas aeruginosa NOT DETECTED NOT DETECTED Final  ? Stenotrophomonas maltophilia NOT DETECTED NOT DETECTED Final  ? Candida albicans NOT DETECTED NOT DETECTED Final  ? Candida auris NOT DETECTED NOT DETECTED Final  ? Candida glabrata NOT DETECTED NOT DETECTED Final  ? Candida krusei NOT DETECTED NOT DETECTED Final  ? Candida parapsilosis NOT DETECTED NOT DETECTED Final  ? Candida tropicalis NOT DETECTED NOT DETECTED Final  ? Cryptococcus neoformans/gattii NOT DETECTED NOT DETECTED Final  ?  Comment: Performed at [facility], [address], [place], [place] [address]  ? ? ?Impression/Plan:  ?1. Pneumonia - previously with Strep pneumoniae pneumonia and completed prolonged treatment with penicillin and now with continued opacification.  Could be aspiration and changed yesterday to ampicillin/sulbactam from cefepime.  No growth on previous trach aspirate.   ?Will continue with ampicillin/sulbactam.   ?To repeat TTE ? ?2.  Leukocytosis - has increased though the fever curve has trended down. Some increased stool output but in the setting of tube feeds.  I less suspect C diff at this time but will continue to follow.   ? ?3. Positive blood cultures - repeat blood cultures with 1/2 with gram positive cocci and BCID negative.  Most c/w a contaminate.  Will continue to monitor.   ? ?  ?

## 2021-10-11 NOTE — Progress Notes (Signed)
TEE ordered to assess for endocarditis.  Presented with severe sepsis due to strep pneumo bacteremia.  TTE 09/22/21 was unremarkable.  His course was complicated by GI bleed.  Underwent EGD 3/3 which showed severe esophagitis with bleeding.  Started PPI BID.  Hgb down to 6.3, currently improved at 7.9.  Given his severe esophagitis, this would be a contraindication to TEE.  Recommend continuing to treat with PPI and can revisit TEE at later date, but would recommend holding off at this time. ? ?Troy Ishikawa, MD ? ?

## 2021-10-11 NOTE — Progress Notes (Addendum)
? ?NAME:  Troy Sampson, MRN:  [id number], DOB:  09-Jul-1956, LOS: 19 ?ADMISSION DATE:  09/21/2021, CONSULTATION DATE:  09/22/2021 ?REFERRING MD:  [facility], CHIEF COMPLAINT:  Shortness of Breath  ? ?History of Present Illness:  ?66 y/o male with a PMHx of duodenal ulcer, IDA, nephrolithiasis and a current smoker who presented from [facility] with c/o SOB x 1 week. History obtained by his wife due to patient arrived obtunded, markedly hypoxic oxygen sats in the low 80s. Patient placed on BiPAP and given Ativan. He was fairly tachypnis with RR in 40s as well as tachycardia and was ultimately intubated for acute hypoxic respiratory failure and increased work of breathing. Noted to have a fever of 101.8. CXR show a LLL pneumonia. PCCM consulted for ICU admission.  ? 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 ?Significant Hospital Events: ?Including procedures, antibiotic start and stop dates in addition to other pertinent events   ?2/22: Admitted overnight to ICU. Blood cultures 3/4 positive for strep pneumo. Strep pneumo antigen positive, TTE with EF 50 to 55% no wall motion abnormalities ?2/26: New onset fevers. Antibiotics broadened to Cefepime and Vancomycin. Propofol switched to Precedex.  ?2/27: Bradycardia noted on Precedex with increased tachypnea.  ?2/28, antibiotics changed to penicillin G per infectious disease recommendation ?3/1: Vasoactive drip requirements improving, received 1 unit of blood for hemoglobin of 6.1 ?3/2 follow-up hemoglobin 6.8, received second unit of blood, has also received 3 units of platelets since admission.  Off norepinephrine  ?3/3 EGD> grade D esophagitis with bleeding ?3/4 tracheostomy ?3/7 ongoing tachypnea limiting SBT. Unable to SBT at all this morning. Overnight episode of bradycardia when being turned requiring atropine ? ?Interim History / Subjective:  ?He denies any events overnight.  Denies any  abdominal pain or or discomfort. ? ?Objective   ?Blood pressure (!) 158/97, pulse (!) 113, temperature 98.8 ?F (37.1 ?C), temperature source Oral, resp. rate (!) 31, height 5\' 7"  (1.702 m), weight 48 kg, SpO2 95 %. ?   ?Vent Mode: PRVC ?FiO2 (%):  [40 %] 40 % ?Set Rate:  [18 bmp] 18 bmp ?Vt Set:  [500 mL] 500 mL ?PEEP:  [5 cmH20-8 cmH20] 5 cmH20 ?Pressure Support:  [8 cmH20] 8 cmH20 ?Plateau Pressure:  [18 cmH20-23 cmH20] 19 cmH20  ? ?Intake/Output Summary (Last 24 hours) at 10/11/2021 0743 ?Last data filed at 10/11/2021 0600 ?Gross per 24 hour  ?Intake 5956.49 ml  ?Output 2700 ml  ?Net 3256.49 ml  ? ? ?Filed Weights  ? 10/09/21 0500 10/10/21 0325 10/11/21 0340  ?Weight: 50.7 kg 52.9 kg 48 kg  ? ? ?  ?Physical exam: ?General: Crtitically ill-appearing male, trach dependence ?HEENT: [place]/AT, eyes anicteric. Cortrak in place ?Neuro: Awake and alert.  Nodding to answer questions.   ?Chest: No wheezes ?Heart: Tachycardic, regular rhythm, no murmurs or gallops ?Abdomen: Soft, nontender, nondistended, bowel sounds present ?Skin: No rash ? ?Resolved Hospital Problem list   ?Septic shock ?Lactic acidosis ?Acute urinary retention ?Thrombocytopenia ?Type 2 MI ?Urinary retention ? ?Assessment & Plan:  ?Acute hypoxic and hypercapnic respiratory failure s/p trach ?Probable COPD ?Continue lung protective ventilation ?Continue SBT trial.  ?VAP prevention place ?CPT scheduled ?Continue trach care per protocol ? ?Severe sepsis due to Strep pneumo CAP + bacteremia-finished 14 dpenicillin G ?New fever and leukocytosis with recurrence/persistence of pneumonia + aspiration ?Patient continued to spike fever now Tmax 101.2.  But overall fever is trending down. ?Chest CT confirmed  persistent consolidation and atelectasis but improved.  Tracheal aspirate only grew Candida likely colonization.  Repeat chest x-ray today showed persistent left lower lobe consolidation but not much worse. ?Cardiology would like to get a TTE before proceed with a  TEE to rule out endocarditis, though less likely.  ?Continue Unasyn per ID for high risk of aspiration. Appreciate ID recommendations. ?If continues to be febrile, consider testing for C. difficile ? ?Acute on chronic blood loss anemia ?Upper GI bleeding from esophagitis, grade D ?Elevated BUN likely secondary to upper GI bleed.  Pending CBC today  ?If hemoglobin continues to trend down, will reconsult GI ?Restart subcu heparin for DVT prophylaxis ?Hold aspirin ?Continue PPI 40 mg twice daily ? ?Severe protein calorie malnutrition, cachexia ?Hypernatremia ?Continue TF through cortrak ?Continue free water flushes ? ?Hypertension:  ?HLDX ?Continue lopressor and Norvasc ?prn hydralazine ?cont statin ?  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 PSA minimally elevated so low suspicion for that being primary. AFP negative. Discussed pursuing IR biopsy in the future with family but given critical illness, patient is not currently stable enough. Suspect this is chronic given last IVDA occurred over 30 years ago. Viral load elevated at 27, 200,000.  ?LFTs are elevated ?Holding Tylenol ? ?Tobacco dependence ?Continue nicotine patch ?Smoking cessation counseling provided ? ?Best Practice (right click and "Reselect all SmartList Selections" daily)  ? ?Diet/type: tubefeeds ?DVT prophylaxis: prophylactic heparin  ?GI prophylaxis: PPI ?Lines: N/A ?Foley:  N/A ?Code Status:  full code ?Last date of multidisciplinary goals of care discussion [ Daughter updated] ? ?Critical care time: ?  ? ?Doran Stabler, DO ? ?

## 2021-10-11 NOTE — Progress Notes (Signed)
Occupational Therapy Treatment Patient Details Name: Troy Sampson MRN: [id number] DOB: 06-25-1956 Today's Date: 10/11/2021   History of present illness 66 y/o male who presented to the ED 2/22 with SOB. After arrival, pt became obtunded due to severe hypoxia and subsequently intubated emergently. Pt admitted with dx of acute hypoxic/hypercapnic respiratory failure 2nd to community acquired pneumonia.  Further testing revealed Hep C, suspected COPD, and potential underlying malignancy. Pt underwent tracheostomy 3/4. PMHx: duodenal ulcer, IDA, nephrolithiasis, ETOH   OT comments  Pt progressing towards established OT goals. Facilitating BUE AAROM exercises at bed level.Pt performing bed mobility with Max A +2 and sitting at EOB with Min-Mod A. Pt performing sit<>stand with Max a +2 and use of stedy. Pt's daughter present and very supportive; providing encouragement and cues for lifting his head and chest. RR elevaitng to 45 and HR 135 max during activity. Continue to recommend dc to AIR for intensive OT and will continue to follow acutely as admitted.    Recommendations for follow up therapy are one component of a multi-disciplinary discharge planning process, led by the attending physician.  Recommendations may be updated based on patient status, additional functional criteria and insurance authorization.    Follow Up Recommendations  Acute inpatient rehab (3hours/day)    Assistance Recommended at Discharge Frequent or constant Supervision/Assistance  Patient can return home with the following  Two people to help with walking and/or transfers;Two people to help with bathing/dressing/bathroom;Assistance with cooking/housework;Assistance with feeding;Direct supervision/assist for medications management;Direct supervision/assist for financial management;Assist for transportation;Help with stairs or ramp for entrance   Equipment Recommendations  Other (comment)    Recommendations for Other Services       Precautions / Restrictions Precautions Precautions: Other (comment) Precaution Comments: vent via trach, NG tube Restrictions Weight Bearing Restrictions: No       Mobility Bed Mobility Overal bed mobility: Needs Assistance Bed Mobility: Supine to Sit     Supine to sit: Max assist, +2 for physical assistance     General bed mobility comments: pt able to activate all 4 exteremities more in today's session, however he still requires total A for to come to sitting EoB    Transfers Overall transfer level: Needs assistance   Transfers: Bed to chair/wheelchair/BSC, Sit to/from Stand Sit to Stand: Max assist, +2 physical assistance, From elevated surface           General transfer comment: pt UE guided to [facility] bar where he was able to maintain hold, pt does have some activation but ultimately requires Max A +2 for power up into standing with stedy. Cues for elevating chest and holding head up high Transfer via Lift Equipment: Stedy   Balance Overall balance assessment: Needs assistance Sitting-balance support: Bilateral upper extremity supported, Feet supported Sitting balance-Leahy Scale: Poor Sitting balance - Comments: has more core activation but still requires outside support to maintain balance                                   ADL either performed or assessed with clinical judgement   ADL Overall ADL's : Needs assistance/impaired                     Lower Body Dressing: Total assistance;Bed level Lower Body Dressing Details (indicate cue type and reason): donning socks Toilet Transfer: Maximal assistance;+2 for physical assistance Toilet Transfer Details (indicate cue type and reason): Max A +2 for power  up into standing. Requiring assistance to bring hips forward and lift chest. Use of stedy for pivot to recliner         Functional mobility during ADLs: Total assistance General ADL Comments: Pt performing bed mobility and then  sit<>stand with stedy. Decreased strength, balance, and activity tolerance    Extremity/Trunk Assessment Upper Extremity Assessment Upper Extremity Assessment: Generalized weakness RUE Deficits / Details: Limited ROM due to weakness. Edema noted. R stronger than L. LUE Deficits / Details: Limited ROM due to weakness. Edema noted. R stronger than L.   Lower Extremity Assessment Lower Extremity Assessment: Defer to PT evaluation        Vision       Perception     Praxis      Cognition Arousal/Alertness: Lethargic Behavior During Therapy: Flat affect Overall Cognitive Status: Difficult to assess                                 General Comments: Generally flat affect; smiling at his daughter. Inconsistently following one step commands. Requiring increased time.        Exercises Exercises: General Upper Extremity General Exercises - Upper Extremity Shoulder Flexion: AAROM, Both, 5 reps, Supine Elbow Flexion: AAROM, Both, 5 reps, Supine Wrist Flexion: PROM, Both, 5 reps, Supine Digit Composite Flexion: PROM, Both, 5 reps, Supine Composite Extension: PROM, Both, 5 reps, Supine    Shoulder Instructions       General Comments RN and daughter present. VSS. RR elevaitng to 45 and HR 135 max during activity    Pertinent Vitals/ Pain       Pain Assessment Pain Assessment: Faces Faces Pain Scale: Hurts little more Pain Location: generalized with movement Pain Descriptors / Indicators: Grimacing, Discomfort Pain Intervention(s): Monitored during session, Limited activity within patient's tolerance, Repositioned  Home Living                                          Prior Functioning/Environment              Frequency  Min 3X/week        Progress Toward Goals  OT Goals(current goals can now be found in the care plan section)  Progress towards OT goals: Progressing toward goals  Acute Rehab OT Goals OT Goal Formulation: With  patient/family Time For Goal Achievement: 10/20/21 Potential to Achieve Goals: Good ADL Goals Pt Will Perform Grooming: with max assist;sitting;bed level Pt Will Perform Upper Body Bathing: with max assist;bed level;sitting Pt Will Perform Lower Body Bathing: sitting/lateral leans;with mod assist Pt/caregiver will Perform Home Exercise Program: Increased ROM;Both right and left upper extremity;With minimal assist;With written HEP provided Additional ADL Goal #1: Pt will tolerate sitting EOB five minutes with mod A for balance.  Plan Discharge plan remains appropriate    Co-evaluation    PT/OT/SLP Co-Evaluation/Treatment: Yes Reason for Co-Treatment: For patient/therapist safety;To address functional/ADL transfers PT goals addressed during session: Mobility/safety with mobility OT goals addressed during session: ADL's and self-care      AM-PAC OT "6 Clicks" Daily Activity     Outcome Measure   Help from another person eating meals?: Total Help from another person taking care of personal grooming?: Total Help from another person toileting, which includes using toliet, bedpan, or urinal?: Total Help from another person bathing (including washing, rinsing, drying)?: Total  Help from another person to put on and taking off regular upper body clothing?: Total Help from another person to put on and taking off regular lower body clothing?: Total 6 Click Score: 6    End of Session Equipment Utilized During Treatment: Oxygen  OT Visit Diagnosis: Other abnormalities of gait and mobility (R26.89);Unsteadiness on feet (R26.81);Muscle weakness (generalized) (M62.81);Feeding difficulties (R63.3);Other symptoms and signs involving the nervous system (R29.898);Other symptoms and signs involving cognitive function;Pain   Activity Tolerance Patient limited by fatigue   Patient Left in chair;with call bell/phone within reach;with chair alarm set;with family/visitor present;with nursing/sitter in  room   Nurse Communication Mobility status        Time: [id number] OT Time Calculation (min): 31 min  Charges: OT General Charges $OT Visit: 1 Visit OT Treatments $Therapeutic Activity: 8-22 mins  [place], OTR/L Acute Rehab Pager: [phone] Office: [place] 10/11/2021, 11:34 AM

## 2021-10-11 NOTE — Progress Notes (Incomplete)
Nutrition Follow-up  DOCUMENTATION CODES:   Severe malnutrition in context of chronic illness  INTERVENTION:   Continue tube feeds via Cortrak: - Increase Vital 1.5 to 60 ml/hr (1440 ml/day) - ProSource TF 45 ml daily  Tube feeding regimen provides 2200 kcal, 108 grams of protein, and 1100 ml of H2O.  - Continue MVI with minerals daily per tube  - Continue 1 packet Juven BID per tube, each packet provides 95 calories, 2.5 grams of protein, and 9.8 grams of carbohydrate; also contains L-arginine and L-glutamine, vitamin C, vitamin E, vitamin B-12, zinc, calcium, and calcium Beta-hydroxy-Beta-methylbutyrate to support wound healing  NUTRITION DIAGNOSIS:   Severe Malnutrition related to chronic illness as evidenced by severe muscle depletion, severe fat depletion.  Ongoing, being addressed via TF  GOAL:   Patient will meet greater than or equal to 90% of their needs  Met via TF  MONITOR:   Vent status, Labs, Weight trends, TF tolerance, Skin, I & O's  REASON FOR ASSESSMENT:   Consult, Ventilator Enteral/tube feeding initiation and management  ASSESSMENT:   Pt with active tobacco dependence and PMH of duodenal ulcer, IDA, and nephrolithiasis admitted with severe acute hypoxic and hypercapnic respiratory failure due to CAP.  02/27 - Cortrak placed (tip gastric, near pylorus) 03/02 - pt with 2 episodes of emesis 03/03 - s/p EGD showing friable esophageal mucosa with active signs of bleeding from gastric fundus and esophagus 03/04 - s/p tracheostomy 03/05 - Cortrak clogged, NG tube placed 03/07 - NG tube removed, Cortrak started working again (tip in third portion of duodenum)  Discussed pt with RN and during ICU rounds. Pt tolerated trach collar for 6 hours on 3/11 per notes.  Current TF: Vital 1.5 @ 50 ml/hr, ProSource TF 45 ml BID, free water flushes of 300 ml q 2 hours  Admit weight: 46.3 kg Current weight: *** kg  Patient is on ventilator support via trach MV:  *** L/min Temp (24hrs), Avg:99.7 F (37.6 C), Min:98.8 F (37.1 C), Max:101.2 F (38.4 C)  Medications reviewed and include: folic acid, SSI q 4 hours, MVI with minerals, Juven BID, protonix, thiamine, IV abx IVF: ***  Vitamin/Mineral Profile: Vitamin B12: 707 (WNL) Folate B9: 5.4 (low) Ferritin: 674 (high)  Labs reviewed: *** CBG's: *** x 24 hours  UOP: *** ml x 24 hours I/O's: *** L since admit  Diet Order:   Diet Order             Diet NPO time specified  Diet effective midnight                   EDUCATION NEEDS:   Not appropriate for education at this time  Skin:  Skin Assessment: Skin Integrity Issues: Stage II: sacrum  Last BM:  10/04/21 type 6  Height:   Ht Readings from Last 1 Encounters:  09/22/21 _0  (1.702 m)    Weight:   Wt Readings from Last 1 Encounters:  10/11/21 48 kg    BMI:  Body mass index is 16.57 kg/m.  Estimated Nutritional Needs:   Kcal:  2000-2200  Protein:  90-110 grams  Fluid:  >1.8 L    Gustavus Bryant, MS, RD, LDN Inpatient Clinical Dietitian Please see AMiON for contact information.

## 2021-10-11 NOTE — TOC Progression Note (Signed)
Transition of Care (TOC) - Initial/Assessment Note  ? ? ?Patient Details  ?Name: Troy Sampson ?MRN: [id number] ?Date of Birth: [birth year] ? ?Transition of Care (TOC) CM/SW Contact:    ?Catalina Pizza Kailynn Satterly, LCSWA ?Phone Number: ?10/11/2021, 11:08 AM ? ?Clinical Narrative:                 ?CSW spoke with the patient's spouse to follow up on the discussion last week between CSW and the patient's spouse about insurance coverage.  The spouse reports that she has not had time to look into this as of yet, but will research tomorrow on her day off.   ? ?Expected Discharge Plan: Long Term Nursing Home ?  ? ? ?Patient Goals and CMS Choice ?  ?  ?  ? ?Expected Discharge Plan and Services ?Expected Discharge Plan: Long Term Nursing Home ?  ?  ?  ?  ?                ?  ?  ?  ?  ?  ?  ?  ?  ?  ?  ? ?Prior Living Arrangements/Services ?  ?  ?  ?       ?  ?  ?  ?  ? ?Activities of Daily Living ?  ?  ? ?Permission Sought/Granted ?  ?  ?   ?   ?   ?   ? ?Emotional Assessment ?  ?  ?  ?  ?  ?  ? ?Admission diagnosis:  Acute respiratory failure with hypoxia (HCC) [J96.01] ?Community acquired pneumonia of left lower lobe of lung [J18.9] ?Sepsis, due to unspecified organism, unspecified whether acute organ dysfunction present (HCC) [A41.9] ?Acute respiratory failure (HCC) [J96.00] ?Patient Active Problem List  ? Diagnosis Date Noted  ? Status post tracheostomy (HCC)   ? Protein-calorie malnutrition, severe 10/01/2021  ? Pressure injury of skin 09/30/2021  ? Pneumonia, pneumococcal (HCC) 09/30/2021  ? Pneumococcal bacteremia 09/30/2021  ? Acute metabolic encephalopathy 09/30/2021  ? Alteration in electrolyte and fluid balance 09/30/2021  ? Hepatitis C 09/30/2021  ? Liver mass 09/30/2021  ? Anemia 09/30/2021  ? NSTEMI (non-ST elevated myocardial infarction) (HCC) 09/30/2021  ? Thrombocytopenia (HCC) 09/30/2021  ? Acute respiratory failure (HCC) 09/22/2021  ? Protein calorie malnutrition (HCC) 09/22/2021  ? Acute respiratory failure with hypoxia  (HCC)   ? Sepsis (HCC)   ? Community acquired pneumonia of left lower lobe of lung 09/21/2021  ? ?PCP:  Pcp, No ?Pharmacy:   ?[id number] - [place], [place] - [address] ?[address] ?[place] [address] ?Phone: [phone] Fax: [phone] ? ? ? ? ?Social Determinants of Health (SDOH) Interventions ?  ? ?Readmission Risk Interventions ?No flowsheet data found. ? ? ?

## 2021-10-12 ENCOUNTER — Inpatient Hospital Stay (HOSPITAL_COMMUNITY): Payer: Medicare Other

## 2021-10-12 DIAGNOSIS — R7881 Bacteremia: Secondary | ICD-10-CM

## 2021-10-12 LAB — GLUCOSE, CAPILLARY
Glucose-Capillary: 122 mg/dL — ABNORMAL HIGH (ref 70–99)
Glucose-Capillary: 130 mg/dL — ABNORMAL HIGH (ref 70–99)
Glucose-Capillary: 122 mg/dL — ABNORMAL HIGH (ref 70–99)
Glucose-Capillary: 106 mg/dL — ABNORMAL HIGH (ref 70–99)
Glucose-Capillary: 121 mg/dL — ABNORMAL HIGH (ref 70–99)

## 2021-10-12 LAB — PREPARE RBC (CROSSMATCH)

## 2021-10-12 LAB — ECHOCARDIOGRAM COMPLETE
AR max vel: 2.02 cm2
AV Area VTI: 2.06 cm2
AV Area mean vel: 2.04 cm2
AV Mean grad: 6 mmHg
AV Peak grad: 11.6 mmHg
Ao pk vel: 1.7 m/s
Area-P 1/2: 4.57 cm2
Calc EF: 60 %
Height: 67 in
S' Lateral: 3.1 cm
Single Plane A2C EF: 61.8 %
Single Plane A4C EF: 62.3 %
Weight: 1777.79 oz

## 2021-10-12 LAB — HEMOGLOBIN AND HEMATOCRIT, BLOOD
HCT: 22.6 % — ABNORMAL LOW (ref 39.0–52.0)
HCT: 27 % — ABNORMAL LOW (ref 39.0–52.0)
Hemoglobin: 6.6 g/dL — CL (ref 13.0–17.0)
Hemoglobin: 8.2 g/dL — ABNORMAL LOW (ref 13.0–17.0)

## 2021-10-12 LAB — CBC
HCT: 23.6 % — ABNORMAL LOW (ref 39.0–52.0)
Hemoglobin: 7 g/dL — ABNORMAL LOW (ref 13.0–17.0)
MCH: 30.2 pg (ref 26.0–34.0)
MCHC: 29.7 g/dL — ABNORMAL LOW (ref 30.0–36.0)
MCV: 101.7 fL — ABNORMAL HIGH (ref 80.0–100.0)
Platelets: 380 10*3/uL (ref 150–400)
RBC: 2.32 MIL/uL — ABNORMAL LOW (ref 4.22–5.81)
RDW: 16.3 % — ABNORMAL HIGH (ref 11.5–15.5)
WBC: 28.7 10*3/uL — ABNORMAL HIGH (ref 4.0–10.5)
nRBC: 0.3 % — ABNORMAL HIGH (ref 0.0–0.2)

## 2021-10-12 LAB — OCCULT BLOOD X 1 CARD TO LAB, STOOL: Fecal Occult Bld: POSITIVE — AB

## 2021-10-12 LAB — CULTURE, BLOOD (ROUTINE X 2): Culture: NO GROWTH

## 2021-10-12 LAB — COMPREHENSIVE METABOLIC PANEL
ALT: 89 U/L — ABNORMAL HIGH (ref 0–44)
AST: 118 U/L — ABNORMAL HIGH (ref 15–41)
Albumin: <1.5 g/dL — ABNORMAL LOW (ref 3.5–5.0)
Alkaline Phosphatase: 280 U/L — ABNORMAL HIGH (ref 38–126)
Anion gap: 5 (ref 5–15)
BUN: 42 mg/dL — ABNORMAL HIGH (ref 8–23)
CO2: 28 mmol/L (ref 22–32)
Calcium: 8.3 mg/dL — ABNORMAL LOW (ref 8.9–10.3)
Chloride: 118 mmol/L — ABNORMAL HIGH (ref 98–111)
Creatinine, Ser: 0.54 mg/dL — ABNORMAL LOW (ref 0.61–1.24)
GFR, Estimated: >60 mL/min (ref >60)
Glucose, Bld: 129 mg/dL — ABNORMAL HIGH (ref 70–99)
Potassium: 4.4 mmol/L (ref 3.5–5.1)
Sodium: 151 mmol/L — ABNORMAL HIGH (ref 135–145)
Total Bilirubin: 0.7 mg/dL (ref 0.3–1.2)
Total Protein: 6.6 g/dL (ref 6.5–8.1)

## 2021-10-12 LAB — PROTIME-INR
INR: 1.1 (ref 0.8–1.2)
Prothrombin Time: 14.1 seconds (ref 11.4–15.2)

## 2021-10-12 LAB — APTT: aPTT: 32 seconds (ref 24–36)

## 2021-10-12 LAB — MAGNESIUM: Magnesium: 2 mg/dL (ref 1.7–2.4)

## 2021-10-12 MED ORDER — DOPAMINE-DEXTROSE 3.2-5 MG/ML-% IV SOLN
2.5000 ug/kg/min | INTRAVENOUS | Status: DC
  Administered 2021-10-12: 2.5 ug/kg/min via INTRAVENOUS
  Filled 2021-10-12: qty 250

## 2021-10-12 MED ORDER — FREE WATER: 400.0000 mL | Status: DC

## 2021-10-12 MED ORDER — DEXTROSE 5 % IV SOLN: INTRAVENOUS | Status: AC

## 2021-10-12 MED ORDER — PANTOPRAZOLE SODIUM 40 MG IV SOLR
40.0000 mg | Freq: Two times a day (BID) | INTRAVENOUS | Status: DC
  Administered 2021-10-12 – 2021-10-25 (×27): 40 mg via INTRAVENOUS
  Filled 2021-10-12 (×27): qty 10

## 2021-10-12 MED ORDER — ONDANSETRON HCL 4 MG/2ML IJ SOLN
4.0000 mg | Freq: Four times a day (QID) | INTRAMUSCULAR | Status: AC | PRN
  Administered 2021-10-12 – 2021-10-13 (×2): 4 mg via INTRAVENOUS
  Filled 2021-10-12 (×2): qty 2

## 2021-10-12 MED ORDER — FREE WATER
200.0000 mL | Status: DC
  Administered 2021-10-12 – 2021-10-13 (×12): 200 mL

## 2021-10-12 MED ORDER — SODIUM CHLORIDE 0.9% IV SOLUTION: Freq: Once | INTRAVENOUS | Status: DC

## 2021-10-12 MED ORDER — VITAL 1.5 CAL PO LIQD
1000.0000 mL | ORAL | Status: DC
  Administered 2021-10-12 – 2021-10-18 (×6): 1000 mL
  Filled 2021-10-12 (×2): qty 1000

## 2021-10-12 NOTE — Progress Notes (Signed)
eLink Physician-Brief Progress Note ?Patient Name: Troy Sampson ?DOB: [birth year] ?MRN: [id number] ? ? ?Date of Service ? 10/12/2021  ?HPI/Events of Note ? Patient with recurrent episodes of bradycardia into the 40's, he's also been nauseated.  ?eICU Interventions ? 10 PM dose of Metoprolol held, PRN Zofran ordered. Will monitor closely and treat bradycardia if < 40 bpm or accompanied by hypotension.  ? ? ? ?  ? ?Migdalia Dk ?10/12/2021, 10:19 PM ?

## 2021-10-12 NOTE — Progress Notes (Signed)
Inpatient Rehab Admissions Coordinator:  ? ?Per therapy recommendations, patient was screened for CIR candidacy by Clemens Catholic, MS, CCC-SLP ?Marland Kitchen At this time, Pt. is remains vent dependent, not an appropriate CIR candidate..  Pt. may have potential to progress to becoming a potential CIR candidate if liberated from the vent, so CIR admissions team will follow and monitor for progress nd place consult order if Pt. appears to be an appropriate candidate. At this time, LTACH appears a more appropriate venue.  Please contact me with any questions.  ? ?Clemens Catholic, MS, CCC-SLP ?Rehab Admissions Coordinator  ?[phone] (celll) ?[phone] (office) ? ?

## 2021-10-12 NOTE — Progress Notes (Signed)
eLink Physician-Brief Progress Note ?Patient Name: Troy Sampson ?DOB: 1956/06/11 ?MRN: [id number] ? ? ?Date of Service ? 10/12/2021  ?HPI/Events of Note ? Patient dropped his hemoglobin from 7.9 gm / dl to 7.0 gm / dl, he's has some blood tinged tracheal secretions with suctioning but is hemodynamically stable.  ?eICU Interventions ? Type and screen, PT-INR, PTT check, H & H at 11 AM.  ? ? ? ?  ? ?Migdalia Dk ?10/12/2021, 4:52 AM ?

## 2021-10-12 NOTE — Progress Notes (Signed)
PCCM interval progress note: ? ?Asked to evaluate patient secondary to bradycardia and secretions with clots, seen with Dr. Celine Mans. Pt now with HR in the 90's without dopamine.  Sats fine, has a hx of GIB, CXR with patchy airspace disease, low suspicion for significant pulmonary hemorrhage.  Dopamine ordered if becomes bradycardic again.  ? ?Troy Sampson Lucan Riner, PA-C ? ?

## 2021-10-12 NOTE — Progress Notes (Signed)
SLP Cancellation Note ? ?Patient Details ?Name: Troy Sampson ?MRN: [id number] ?DOB: 03-Dec-1955 ? ? ?Cancelled treatment:       Reason Eval/Treat Not Completed: Patient not medically ready. D/W RN - she will reach out to SLP if he weans to ATC today. ? ?Troy Sampson L. Aadhav Uhlig, MA CCC/SLP ?Acute Rehabilitation Services ?Office number [phone] ?Pager [phone] ? ? ? ?[name] ?10/12/2021, 11:04 AM ?

## 2021-10-12 NOTE — Progress Notes (Addendum)
? ?NAME:  Troy Sampson, MRN:  [id number], DOB:  April 07, 1956, LOS: 20 ?ADMISSION DATE:  09/21/2021, CONSULTATION DATE:  09/22/2021 ?REFERRING MD:  [facility] [place], CHIEF COMPLAINT:  Shortness of Breath  ? ?History of Present Illness:  ?66 y/o male with a PMHx of duodenal ulcer, IDA, nephrolithiasis and a current smoker who presented from [facility] with c/o SOB x 1 week. History obtained by his wife due to patient arrived obtunded, markedly hypoxic oxygen sats in the low 80s. Patient placed on BiPAP and given Ativan. He was fairly tachypnis with RR in 40s as well as tachycardia and was ultimately intubated for acute hypoxic respiratory failure and increased work of breathing. Noted to have a fever of 101.8. CXR show a LLL pneumonia. PCCM consulted for ICU admission.  ? 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 ?Significant Hospital Events: ?Including procedures, antibiotic start and stop dates in addition to other pertinent events   ?2/22: Admitted overnight to ICU. Blood cultures 3/4 positive for strep pneumo. Strep pneumo antigen positive, TTE with EF 50 to 55% no wall motion abnormalities ?2/26: New onset fevers. Antibiotics broadened to Cefepime and Vancomycin. Propofol switched to Precedex.  ?2/27: Bradycardia noted on Precedex with increased tachypnea.  ?2/28, antibiotics changed to penicillin G per infectious disease recommendation ?3/1: Vasoactive drip requirements improving, received 1 unit of blood for hemoglobin of 6.1 ?3/2 follow-up hemoglobin 6.8, received second unit of blood, has also received 3 units of platelets since admission.  Off norepinephrine  ?3/3 EGD> grade D esophagitis with bleeding ?3/4 tracheostomy ?3/7 ongoing tachypnea limiting SBT. Unable to SBT at all this morning. Overnight episode of bradycardia when being turned requiring atropine ? ?Interim History / Subjective:  ?Patient endorses abdominal discomfort this morning.   ? ?Objective   ?Blood pressure 112/67, pulse 93, temperature 98.4 ?F (36.9 ?C), temperature source Oral, resp. rate (!) 26, height 5\' 7"  (1.702 m), weight 48 kg, SpO2 99 %. ?   ?Vent Mode: PRVC ?FiO2 (%):  [40 %] 40 % ?Set Rate:  [18 bmp] 18 bmp ?Vt Set:  [500 mL] 500 mL ?PEEP:  [8 cmH20] 8 cmH20 ?Pressure Support:  [8 cmH20] 8 cmH20 ?Plateau Pressure:  [23 cmH20-26 cmH20] 26 cmH20  ? ?Intake/Output Summary (Last 24 hours) at 10/12/2021 0728 ?Last data filed at 10/12/2021 0600 ?Gross per 24 hour  ?Intake 2398.25 ml  ?Output 2525 ml  ?Net -126.75 ml  ? ? ?Filed Weights  ? 10/09/21 0500 10/10/21 0325 10/11/21 0340  ?Weight: 50.7 kg 52.9 kg 48 kg  ? ?Physical exam: ?General: Crtitically ill-appearing male, trach dependence ?HEENT: [place]/AT, eyes anicteric. Cortrak in place ?Neuro: Awake and alert.  Nodding to answer questions.   ?Chest: No wheezes ?Heart: regular rate and rhythm, no murmurs or gallops ?Abdomen: mild distention, hyperactive bowel sound. Dark loose stool in rectal tube.  ?GU: blood tinge urine  ?Skin: No rash ? ?Resolved Hospital Problem list   ?Septic shock ?Lactic acidosis ?Acute urinary retention ?Thrombocytopenia ?Type 2 MI ?Urinary retention ? ?Assessment & Plan:  ?Acute hypoxic and hypercapnic respiratory failure s/p trach ?Probable COPD ?Continue lung protective ventilation ?Continue SBT trial.  ?VAP prevention place ?CPT scheduled ?Continue trach care per protocol ? ?Severe sepsis due to Strep pneumo CAP + bacteremia-finished 14 dpenicillin G ?New fever and leukocytosis with recurrence/persistence of pneumonia + aspiration ?Afebrile overnight. Fever curve and WBC trending down.  ?Blood culture grew Kocuria, likely contamination. Pending TTE to  rule out endocarditis  ?Continue Unasyn per ID for high risk of aspiration. Appreciate ID recommendations. ?Patient did have high stool output overnight, but with improvement of fever and WBC, will hold off on C. Diff testing.  ? ?Acute on chronic blood loss  anemia ?Upper GI bleeding from esophagitis, grade D ?Hgb trending down to 7. Will give 1 unit of blood. FOBT remains positive.  ?Will re-consult GI.  Spoke with Dr. Elnoria Howard.  He does not have any additional recommendation.  No aggressive interventions.  Continue supportive care. ?Hold heparin for DVT proph until hemoglobin stable ?Hold aspirin ?Continue PPI 40 mg twice daily, switch IV ? ?Severe protein calorie malnutrition, cachexia ?Hypernatremia ?Continue TF through cortrak ?Continue free water flushes. Will continue 200 cc Q2h ?Added D5 30 cc/h ? ?Hypertension:  ?HLDX ?Continue lopressor and Norvasc ?prn hydralazine ?cont statin ?  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 PSA minimally elevated so low suspicion for that being primary. AFP negative. Discussed pursuing IR biopsy in the future with family but given critical illness, patient is not currently stable enough. Suspect this is chronic given last IVDA occurred over 30 years ago. Viral load elevated at 27, 200,000.  ?LFTs are elevated ?Holding Tylenol ? ?Tobacco dependence ?Continue nicotine patch ?Smoking cessation counseling provided ? ?Addendum: I called and spoke to patient's daughter this morning.  I explained the overall poor prognosis given ongoing infection, active GI bleed and liver masses concerning for cancer.  Patient states that the ultimate goal for the family this is for him to go home but it may not be a realistic goal.  I expressed my concern that patient may be ventilator dependent for the rest of his life.  She verbalizes understanding and agree with a palliative consult to help facilitate goal of care conversation. ? ?Best Practice (right click and "Reselect all SmartList Selections" daily)  ? ?Diet/type: tubefeeds ?DVT prophylaxis: SCD ?GI prophylaxis:  PPI ?Lines: N/A ?Foley:  N/A ?Code Status:  full code ?Last date of multidisciplinary goals of care discussion: will update family ?Critical care time: ?  ? ?Doran Stabler, DO ? ?

## 2021-10-12 NOTE — Progress Notes (Signed)
eLink Physician-Brief Progress Note ?Patient Name: Troy Sampson ?DOB: 1956-07-01 ?MRN: [id number] ? ? ?Date of Service ? 10/12/2021  ?HPI/Events of Note ? Patient became bradycardic down into the 20's, per bedside RN there was bloody secretions and clots suctioned from his airway.  ?eICU Interventions ? Stat portable CXR, 12 lead EKG, Dopamine 2.5 mcg gtt ordered, pacing pads placed on the patient, D-Dimer to screen for possible PE,  H & H, PCCM ground crew asked to evaluate for possible airway bleeding.  ? ? ? ?  ? ?Frederik Pear ?10/12/2021, 11:26 PM ?

## 2021-10-12 NOTE — Progress Notes (Signed)
Upon entry to room, PT rate into 40's with increased WOB. Placed pt back on FS. TX given. PT suction. Trach care done. CPT done. Will continue to monitor.  ?

## 2021-10-12 NOTE — Progress Notes (Signed)
?  [facility] ? ? ?Reason for visit: follow up on pneumonia ? ?Interval History: fever curve trending down, WBC down to 28.7.  some increased stool output.   ?  ? ?Physical Exam: ?Constitutional:  ?Vitals:  ? 10/12/21 0905 10/12/21 1000  ?BP: 116/64 122/68  ?Pulse: 96 90  ?Resp: (!) 25 (!) 28  ?Temp: 99.3 ?F (37.4 ?C)   ?SpO2:  99%  ?Appears tired, in nad ?HENT: + trach ?Respiratory: CTA B, anterior exam ? ? ?Review of Systems: ?Unable to be assessed due to patient factors ? ?Lab Results  ?Component Value Date  ? WBC 28.7 (H) 10/12/2021  ? HGB 6.6 (LL) 10/12/2021  ? HCT 22.6 (L) 10/12/2021  ? MCV 101.7 (H) 10/12/2021  ? PLT 380 10/12/2021  ?  ?Lab Results  ?Component Value Date  ? CREATININE 0.54 (L) 10/12/2021  ? BUN 42 (H) 10/12/2021  ? NA 151 (H) 10/12/2021  ? K 4.4 10/12/2021  ? CL 118 (H) 10/12/2021  ? CO2 28 10/12/2021  ?  ?Lab Results  ?Component Value Date  ? ALT 89 (H) 10/12/2021  ? AST 118 (H) 10/12/2021  ? ALKPHOS 280 (H) 10/12/2021  ?  ? ?Microbiology: ?Recent Results (from the past 240 hour(s))  ?Culture, Respiratory w Gram Stain     Status: None  ? Collection Time: 10/06/21  9:55 AM  ? Specimen: Tracheal Aspirate; Respiratory  ?Result Value Ref Range Status  ? Specimen Description TRACHEAL ASPIRATE  Final  ? Special Requests NONE  Final  ? Gram Stain   Final  ?  ABUNDANT WBC PRESENT, PREDOMINANTLY PMN ?RARE BUDDING YEAST SEEN ?  ? Culture   Final  ?  FEW CANDIDA TROPICALIS ?NO STAPHYLOCOCCUS AUREUS ISOLATED ?No Pseudomonas species isolated ?Performed at [facility], [address], [place], [place] [address] ?  ? Report Status 10/08/2021 FINAL  Final  ?Culture, blood (routine x 2)     Status: None  ? Collection Time: 10/07/21  8:25 AM  ? Specimen: BLOOD  ?Result Value Ref Range Status  ? Specimen Description BLOOD THUMB  Final  ? Special Requests   Final  ?  BOTTLES DRAWN AEROBIC ONLY Blood Culture results may not be optimal due to an inadequate volume of blood  received in culture bottles  ? Culture   Final  ?  NO GROWTH 5 DAYS ?Performed at [facility], [address], [place], [place] [address] ?  ? Report Status 10/12/2021 FINAL  Final  ?Culture, blood (routine x 2)     Status: Abnormal  ? Collection Time: 10/07/21  8:36 AM  ? Specimen: BLOOD LEFT HAND  ?Result Value Ref Range Status  ? Specimen Description BLOOD LEFT HAND  Final  ? Special Requests   Final  ?  BOTTLES DRAWN AEROBIC ONLY Blood Culture adequate volume  ? Culture  Setup Time GRAM POSITIVE COCCI ?AEROBIC BOTTLE ONLY ?  Final  ? Culture (A)  Final  ?  KOCURIA SPECIES ?THE SIGNIFICANCE OF ISOLATING THIS ORGANISM FROM A SINGLE SET OF BLOOD CULTURES WHEN MULTIPLE SETS ARE DRAWN IS UNCERTAIN. PLEASE NOTIFY THE MICROBIOLOGY DEPARTMENT WITHIN ONE WEEK IF SPECIATION AND SENSITIVITIES ARE REQUIRED. ?Performed at [facility], [address], [place], [place] [address] ?  ? Report Status 10/11/2021 FINAL  Final  ?Blood Culture ID Panel (Reflexed)     Status: None  ? Collection Time: 10/07/21  8:36 AM  ?Result Value Ref Range Status  ?  Enterococcus faecalis NOT DETECTED NOT DETECTED Final  ? Enterococcus Faecium NOT DETECTED NOT DETECTED Final  ? Listeria monocytogenes NOT DETECTED NOT DETECTED Final  ? Staphylococcus species NOT DETECTED NOT DETECTED Final  ? Staphylococcus aureus (BCID) NOT DETECTED NOT DETECTED Final  ? Staphylococcus epidermidis NOT DETECTED NOT DETECTED Final  ? Staphylococcus lugdunensis NOT DETECTED NOT DETECTED Final  ? Streptococcus species NOT DETECTED NOT DETECTED Final  ? Streptococcus agalactiae NOT DETECTED NOT DETECTED Final  ? Streptococcus pneumoniae NOT DETECTED NOT DETECTED Final  ? Streptococcus pyogenes NOT DETECTED NOT DETECTED Final  ? A.calcoaceticus-baumannii NOT DETECTED NOT DETECTED Final  ? Bacteroides fragilis NOT DETECTED NOT DETECTED Final  ? Enterobacterales NOT DETECTED NOT DETECTED Final  ? Enterobacter cloacae complex NOT DETECTED NOT DETECTED Final  ?  Escherichia coli NOT DETECTED NOT DETECTED Final  ? Klebsiella aerogenes NOT DETECTED NOT DETECTED Final  ? Klebsiella oxytoca NOT DETECTED NOT DETECTED Final  ? Klebsiella pneumoniae NOT DETECTED NOT DETECTED Final  ? Proteus species NOT DETECTED NOT DETECTED Final  ? Salmonella species NOT DETECTED NOT DETECTED Final  ? Serratia marcescens NOT DETECTED NOT DETECTED Final  ? Haemophilus influenzae NOT DETECTED NOT DETECTED Final  ? Neisseria meningitidis NOT DETECTED NOT DETECTED Final  ? Pseudomonas aeruginosa NOT DETECTED NOT DETECTED Final  ? Stenotrophomonas maltophilia NOT DETECTED NOT DETECTED Final  ? Candida albicans NOT DETECTED NOT DETECTED Final  ? Candida auris NOT DETECTED NOT DETECTED Final  ? Candida glabrata NOT DETECTED NOT DETECTED Final  ? Candida krusei NOT DETECTED NOT DETECTED Final  ? Candida parapsilosis NOT DETECTED NOT DETECTED Final  ? Candida tropicalis NOT DETECTED NOT DETECTED Final  ? Cryptococcus neoformans/gattii NOT DETECTED NOT DETECTED Final  ?  Comment: Performed at [facility], [address], [place], [place] [address]  ? ? ?Impression/Plan:  ?1. Pneumonia - Pneumococcal pneumonia and has been treated.  CT with increased opacities and I am concerned with aspiration. Stable since starting ampicillin/sulbactam. ?Will continue with antibiotics.  ? ?2.  Leukocytosis - trending down now since starting above antibiotics.  Will continue to monitor.    ? ?3. Increased stool output - getting tube feeds and otherwise his fever and leukocytosis are improving.  Less likely C diff infection.  Will continue to monitor.   ? ?  ?

## 2021-10-13 ENCOUNTER — Inpatient Hospital Stay (HOSPITAL_COMMUNITY): Payer: Medicare Other

## 2021-10-13 DIAGNOSIS — Z7189 Other specified counseling: Secondary | ICD-10-CM

## 2021-10-13 DIAGNOSIS — D649 Anemia, unspecified: Secondary | ICD-10-CM

## 2021-10-13 DIAGNOSIS — Z515 Encounter for palliative care: Secondary | ICD-10-CM

## 2021-10-13 LAB — TYPE AND SCREEN
ABO/RH(D): B POS
Antibody Screen: NEGATIVE
Unit division: 0

## 2021-10-13 LAB — COMPREHENSIVE METABOLIC PANEL
ALT: 97 U/L — ABNORMAL HIGH (ref 0–44)
AST: 125 U/L — ABNORMAL HIGH (ref 15–41)
Albumin: <1.5 g/dL — ABNORMAL LOW (ref 3.5–5.0)
Alkaline Phosphatase: 252 U/L — ABNORMAL HIGH (ref 38–126)
Anion gap: 10 (ref 5–15)
BUN: 29 mg/dL — ABNORMAL HIGH (ref 8–23)
CO2: 26 mmol/L (ref 22–32)
Calcium: 8 mg/dL — ABNORMAL LOW (ref 8.9–10.3)
Chloride: 109 mmol/L (ref 98–111)
Creatinine, Ser: 0.53 mg/dL — ABNORMAL LOW (ref 0.61–1.24)
GFR, Estimated: >60 mL/min (ref >60)
Glucose, Bld: 136 mg/dL — ABNORMAL HIGH (ref 70–99)
Potassium: 4 mmol/L (ref 3.5–5.1)
Sodium: 145 mmol/L (ref 135–145)
Total Bilirubin: 0.6 mg/dL (ref 0.3–1.2)
Total Protein: 6.2 g/dL — ABNORMAL LOW (ref 6.5–8.1)

## 2021-10-13 LAB — BPAM RBC
Blood Product Expiration Date: 202303202359
ISSUE DATE / TIME: 202303140838
Unit Type and Rh: 1700

## 2021-10-13 LAB — GLUCOSE, CAPILLARY
Glucose-Capillary: 102 mg/dL — ABNORMAL HIGH (ref 70–99)
Glucose-Capillary: 121 mg/dL — ABNORMAL HIGH (ref 70–99)
Glucose-Capillary: 122 mg/dL — ABNORMAL HIGH (ref 70–99)
Glucose-Capillary: 129 mg/dL — ABNORMAL HIGH (ref 70–99)
Glucose-Capillary: 87 mg/dL (ref 70–99)
Glucose-Capillary: 93 mg/dL (ref 70–99)

## 2021-10-13 LAB — CBC
HCT: 24.1 % — ABNORMAL LOW (ref 39.0–52.0)
HCT: 25 % — ABNORMAL LOW (ref 39.0–52.0)
Hemoglobin: 7.6 g/dL — ABNORMAL LOW (ref 13.0–17.0)
Hemoglobin: 8.1 g/dL — ABNORMAL LOW (ref 13.0–17.0)
MCH: 30.8 pg (ref 26.0–34.0)
MCH: 32.7 pg (ref 26.0–34.0)
MCHC: 31.5 g/dL (ref 30.0–36.0)
MCHC: 32.4 g/dL (ref 30.0–36.0)
MCV: 97.6 fL (ref 80.0–100.0)
MCV: 100.8 fL — ABNORMAL HIGH (ref 80.0–100.0)
Platelets: 308 10*3/uL (ref 150–400)
Platelets: 276 10*3/uL (ref 150–400)
RBC: 2.47 MIL/uL — ABNORMAL LOW (ref 4.22–5.81)
RBC: 2.48 MIL/uL — ABNORMAL LOW (ref 4.22–5.81)
RDW: 15.9 % — ABNORMAL HIGH (ref 11.5–15.5)
RDW: 16.1 % — ABNORMAL HIGH (ref 11.5–15.5)
WBC: 27.7 10*3/uL — ABNORMAL HIGH (ref 4.0–10.5)
WBC: 27.7 10*3/uL — ABNORMAL HIGH (ref 4.0–10.5)
nRBC: 0.3 % — ABNORMAL HIGH (ref 0.0–0.2)
nRBC: 0.4 % — ABNORMAL HIGH (ref 0.0–0.2)

## 2021-10-13 LAB — TROPONIN I (HIGH SENSITIVITY)
Troponin I (High Sensitivity): 18 ng/L — ABNORMAL HIGH (ref <18)
Troponin I (High Sensitivity): 19 ng/L — ABNORMAL HIGH (ref <18)

## 2021-10-13 LAB — D-DIMER, QUANTITATIVE: D-Dimer, Quant: 3.15 ug/mL-FEU — ABNORMAL HIGH (ref 0.00–0.50)

## 2021-10-13 MED ORDER — DEXTROSE 5 % IV SOLN: INTRAVENOUS | Status: DC

## 2021-10-13 MED ORDER — FREE WATER
200.0000 mL | Status: DC
  Administered 2021-10-13 – 2021-10-14 (×5): 200 mL

## 2021-10-13 NOTE — Progress Notes (Signed)
?  [facility] ? ? ?Reason for visit: follow up on pneumonia ? ?Interval History: Tmax 100.7, WBC 27.7.  up in chair this am. Palliative care meeting with the family today.   ? Day 4 ampicillin/sulbactam ? ?Physical Exam: ?Constitutional:  ?Vitals:  ? 10/13/21 0728 10/13/21 0800  ?BP:  (!) 105/57  ?Pulse: 76 78  ?Resp: (!) 26 20  ?Temp:  (!) 100.5 ?F (38.1 ?C)  ?SpO2: 100% 98%  ?Appears tired, in nad, up in chair ?HENT: + trach ?Skin: no rashes ? ? ?Review of Systems: ?Unable to be assessed due to patient factors ? ?Lab Results  ?Component Value Date  ? WBC 27.7 (H) 10/13/2021  ? HGB 7.6 (L) 10/13/2021  ? HCT 24.1 (L) 10/13/2021  ? MCV 97.6 10/13/2021  ? PLT 308 10/13/2021  ?  ?Lab Results  ?Component Value Date  ? CREATININE 0.53 (L) 10/13/2021  ? BUN 29 (H) 10/13/2021  ? NA 145 10/13/2021  ? K 4.0 10/13/2021  ? CL 109 10/13/2021  ? CO2 26 10/13/2021  ?  ?Lab Results  ?Component Value Date  ? ALT 97 (H) 10/13/2021  ? AST 125 (H) 10/13/2021  ? ALKPHOS 252 (H) 10/13/2021  ?  ? ?Microbiology: ?Recent Results (from the past 240 hour(s))  ?Culture, Respiratory w Gram Stain     Status: None  ? Collection Time: 10/06/21  9:55 AM  ? Specimen: Tracheal Aspirate; Respiratory  ?Result Value Ref Range Status  ? Specimen Description TRACHEAL ASPIRATE  Final  ? Special Requests NONE  Final  ? Gram Stain   Final  ?  ABUNDANT WBC PRESENT, PREDOMINANTLY PMN ?RARE BUDDING YEAST SEEN ?  ? Culture   Final  ?  FEW CANDIDA TROPICALIS ?NO STAPHYLOCOCCUS AUREUS ISOLATED ?No Pseudomonas species isolated ?Performed at [facility], [place] [address] [place], [place] [address] ?  ? Report Status 10/08/2021 FINAL  Final  ?Culture, blood (routine x 2)     Status: None  ? Collection Time: 10/07/21  8:25 AM  ? Specimen: BLOOD  ?Result Value Ref Range Status  ? Specimen Description BLOOD THUMB  Final  ? Special Requests   Final  ?  BOTTLES DRAWN AEROBIC ONLY Blood Culture results may not be optimal due to an  inadequate volume of blood received in culture bottles  ? Culture   Final  ?  NO GROWTH 5 DAYS ?Performed at [facility], [place] [address] [place], [place] [address] ?  ? Report Status 10/12/2021 FINAL  Final  ?Culture, blood (routine x 2)     Status: Abnormal  ? Collection Time: 10/07/21  8:36 AM  ? Specimen: BLOOD LEFT HAND  ?Result Value Ref Range Status  ? Specimen Description BLOOD LEFT HAND  Final  ? Special Requests   Final  ?  BOTTLES DRAWN AEROBIC ONLY Blood Culture adequate volume  ? Culture  Setup Time GRAM POSITIVE COCCI ?AEROBIC BOTTLE ONLY ?  Final  ? Culture (A)  Final  ?  KOCURIA SPECIES ?THE SIGNIFICANCE OF ISOLATING THIS ORGANISM FROM A SINGLE SET OF BLOOD CULTURES WHEN MULTIPLE SETS ARE DRAWN IS UNCERTAIN. PLEASE NOTIFY THE MICROBIOLOGY DEPARTMENT WITHIN ONE WEEK IF SPECIATION AND SENSITIVITIES ARE REQUIRED. ?Performed at [facility], [place] [address], [place], [place] [address] ?  ? Report Status 10/11/2021 FINAL  Final  ?Blood Culture ID Panel (Reflexed)     Status: None  ? Collection Time: 10/07/21  8:36 AM  ?Result  Value Ref Range Status  ? Enterococcus faecalis NOT DETECTED NOT DETECTED Final  ? Enterococcus Faecium NOT DETECTED NOT DETECTED Final  ? Listeria monocytogenes NOT DETECTED NOT DETECTED Final  ? Staphylococcus species NOT DETECTED NOT DETECTED Final  ? Staphylococcus aureus (BCID) NOT DETECTED NOT DETECTED Final  ? Staphylococcus epidermidis NOT DETECTED NOT DETECTED Final  ? Staphylococcus lugdunensis NOT DETECTED NOT DETECTED Final  ? Streptococcus species NOT DETECTED NOT DETECTED Final  ? Streptococcus agalactiae NOT DETECTED NOT DETECTED Final  ? Streptococcus pneumoniae NOT DETECTED NOT DETECTED Final  ? Streptococcus pyogenes NOT DETECTED NOT DETECTED Final  ? A.calcoaceticus-baumannii NOT DETECTED NOT DETECTED Final  ? Bacteroides fragilis NOT DETECTED NOT DETECTED Final  ? Enterobacterales NOT DETECTED NOT DETECTED Final  ? Enterobacter cloacae complex NOT  DETECTED NOT DETECTED Final  ? Escherichia coli NOT DETECTED NOT DETECTED Final  ? Klebsiella aerogenes NOT DETECTED NOT DETECTED Final  ? Klebsiella oxytoca NOT DETECTED NOT DETECTED Final  ? Klebsiella pneumoniae NOT DETECTED NOT DETECTED Final  ? Proteus species NOT DETECTED NOT DETECTED Final  ? Salmonella species NOT DETECTED NOT DETECTED Final  ? Serratia marcescens NOT DETECTED NOT DETECTED Final  ? Haemophilus influenzae NOT DETECTED NOT DETECTED Final  ? Neisseria meningitidis NOT DETECTED NOT DETECTED Final  ? Pseudomonas aeruginosa NOT DETECTED NOT DETECTED Final  ? Stenotrophomonas maltophilia NOT DETECTED NOT DETECTED Final  ? Candida albicans NOT DETECTED NOT DETECTED Final  ? Candida auris NOT DETECTED NOT DETECTED Final  ? Candida glabrata NOT DETECTED NOT DETECTED Final  ? Candida krusei NOT DETECTED NOT DETECTED Final  ? Candida parapsilosis NOT DETECTED NOT DETECTED Final  ? Candida tropicalis NOT DETECTED NOT DETECTED Final  ? Cryptococcus neoformans/gattii NOT DETECTED NOT DETECTED Final  ?  Comment: Performed at [facility], [address], [place], [place] [address]  ? ? ?Impression/Plan:  ?1.aspiration pneumonia - overall improving with an improved CXR since changing to amp/sulbactam.  WBC stable.   ?Plan to continue with ampicillin/sulbactam through 3/17 and then stop.  ? ?2.  Fever - some mild fever and may have some ongoing aspiration pneumonitis or fever from other interventions.  No signs of a new infection at this time otherwise. ?Will continue to monitor.  ? ?3. Stool output - improved and doubt C diff.  Likely from tube feeds and less output now.   ?Will continue to monitor.  ? ?  ?

## 2021-10-13 NOTE — Progress Notes (Signed)
Cardiac Monitoring Event ? ?Dysrhythmia:  Sinus bradycardia ? ?Symptoms:  Asymptomatic ? ?Level of Consciousness:  Arousable ? ?Last set of vital signs taken: ? Temp: 99.1 ?F (37.3 ?C) ? Pulse Rate: 89 ? Resp: (!) 25 ? BP: (!) 93/53 ? SpO2: 100 % ? ?Name of MD Notified:  Elink  ? ?Time MD Notified:   ?2335 ?Comments/Actions Taken:  see new orders ?

## 2021-10-13 NOTE — Progress Notes (Addendum)
Occupational Therapy Treatment ?Patient Details ?Name: Troy Sampson ?MRN: [id number] ?DOB: 11-01-1955 ?Today's Date: 10/13/2021 ? ? ?History of present illness Pt is a 66 y/o male who presented to the ED 2/22 with SOB. After arrival, pt became obtunded due to severe hypoxia and subsequently intubated emergently. Pt admitted with dx of acute hypoxic/hypercapnic respiratory failure 2nd to community acquired pneumonia.  Further testing revealed Hep C, suspected COPD, and potential underlying malignancy. Pt underwent tracheostomy 3/4. PMHx: duodenal ulcer, IDA, nephrolithiasis, ETOH ?  ?OT comments ? Pt with slower progress towards OT goals though improved alertness and command following noted. Pt overall Total A x 2 for bed mobility and squat pivot to recliner chair but able to maintain sitting balance with no more than Mod A  EOB. With fatigue, pt requires increased physical assist and noted with delayed motor planning. Pt's wife present; encouraged her to assist pt with AAROM exercises bed level to combat stiffness. DC recs remain appropriate pending pt's ability to increase activity tolerance.  ? ?VSS on 40% PRVC vent via trach, PEEP 5  ? ?Recommendations for follow up therapy are one component of a multi-disciplinary discharge planning process, led by the attending physician.  Recommendations may be updated based on patient status, additional functional criteria and insurance authorization. ?   ?Follow Up Recommendations ? Acute inpatient rehab (3hours/day)  ?  ?Assistance Recommended at Discharge Frequent or constant Supervision/Assistance  ?Patient can return home with the following ? Two people to help with walking and/or transfers;Two people to help with bathing/dressing/bathroom;Assistance with cooking/housework;Assistance with feeding;Direct supervision/assist for medications management;Direct supervision/assist for financial management;Assist for transportation;Help with stairs or ramp for entrance ?  ?Equipment  Recommendations ? Other (comment) (TBD pending progress)  ?  ?Recommendations for Other Services Rehab consult ? ?  ?Precautions / Restrictions Precautions ?Precautions: Fall;Other (comment) ?Precaution Comments: vent via trach, NG tube, flexiseal ?Restrictions ?Weight Bearing Restrictions: No  ? ? ?  ? ?Mobility Bed Mobility ?Overal bed mobility: Needs Assistance ?Bed Mobility: Supine to Sit ?  ?  ?Supine to sit: Total assist, +2 for physical assistance ?  ?  ?General bed mobility comments: pt able to activate all 4 exteremities more in today's session, however he still requires total A for to come to sitting EoB ?  ? ?Transfers ?Overall transfer level: Needs assistance ?Equipment used: 2 person hand held assist ?Transfers: Bed to chair/wheelchair/BSC ?  ?  ?Squat pivot transfers: Total assist, +2 physical assistance, +2 safety/equipment ?  ?  ?  ?General transfer comment: Opted for squat pivot to chair with pt fatigue sitting EOB, Total A x 2 to scoot towards recliner and then pivot over to drop arm recliner ?  ?  ?Balance Overall balance assessment: Needs assistance ?Sitting-balance support: Bilateral upper extremity supported, Feet supported ?Sitting balance-Leahy Scale: Poor ?Sitting balance - Comments: has more core activation but still requires outside support to maintain balance (overall Min A to Mod A with fatigue) ?  ?  ?  ?  ?  ?  ?  ?  ?  ?  ?  ?  ?  ?  ?  ?   ? ?ADL either performed or assessed with clinical judgement  ? ?ADL Overall ADL's : Needs assistance/impaired ?Eating/Feeding: NPO ?  ?  ?  ?  ?  ?  ?  ?  ?  ?Lower Body Dressing: Total assistance;Bed level ?Lower Body Dressing Details (indicate cue type and reason): donning socks ?  ?  ?  ?  ?  ?  ?  ?  General ADL Comments: focus on sitting balance/tolerance EOB and squat pivot transfer training with noted weakness ?  ? ?Extremity/Trunk Assessment Upper Extremity Assessment ?Upper Extremity Assessment: RUE deficits/detail;LUE deficits/detail ?RUE  Deficits / Details: Limited ROM due to weakness. Edema noted. R stronger than L. ?RUE Coordination: decreased fine motor;decreased gross motor ?LUE Deficits / Details: Limited ROM due to weakness. Edema noted. R stronger than L. ?LUE Coordination: decreased fine motor;decreased gross motor ?  ?Lower Extremity Assessment ?Lower Extremity Assessment: Defer to PT evaluation ?  ?  ?  ? ?Vision   ?Vision Assessment?: No apparent visual deficits ?  ?Perception   ?  ?Praxis   ?  ? ?Cognition Arousal/Alertness: Awake/alert ?Behavior During Therapy: Flat affect ?Overall Cognitive Status: Impaired/Different from baseline ?Area of Impairment: Attention, Following commands, Safety/judgement, Awareness, Problem solving ?  ?  ?  ?  ?  ?  ?  ?  ?  ?Current Attention Level: Sustained ?  ?Following Commands: Follows one step commands with increased time ?Safety/Judgement: Decreased awareness of safety, Decreased awareness of deficits ?Awareness: Intellectual ?Problem Solving: Slow processing, Decreased initiation, Difficulty sequencing, Requires verbal cues, Requires tactile cues ?General Comments: flat affect, follows one step commands relatively well (may also be HOH). slower processing and delayed motor planning at times ?  ?  ?   ?Exercises Exercises: Other exercises ?Other Exercises ?Other Exercises: composite flexion/extension, elbow extension/flexion, shoulder flexion/extension; 2-3 reps each side, spouse educated on exercise ? ?  ?Shoulder Instructions   ? ? ?  ?General Comments Wife initially present then stepped out to speak with palliative staff  ? ? ?Pertinent Vitals/ Pain       Pain Assessment ?Pain Assessment: Faces ?Faces Pain Scale: Hurts a little bit ?Pain Location: generalized with movement ?Pain Descriptors / Indicators: Grimacing, Discomfort ?Pain Intervention(s): Monitored during session, Limited activity within patient's tolerance ? ?Home Living   ?  ?  ?  ?  ?  ?  ?  ?  ?  ?  ?  ?  ?  ?  ?  ?  ?  ?  ? ?   ?Prior Functioning/Environment    ?  ?  ?  ?   ? ?Frequency ? Min 3X/week  ? ? ? ? ?  ?Progress Toward Goals ? ?OT Goals(current goals can now be found in the care plan section) ? Progress towards OT goals: Progressing toward goals ? ?Acute Rehab OT Goals ?Patient Stated Goal: agreeable to get up in chair; also encouraged by wife ?OT Goal Formulation: With patient/family ?Time For Goal Achievement: 10/20/21 ?Potential to Achieve Goals: Good ?ADL Goals ?Pt Will Perform Grooming: with max assist;sitting;bed level ?Pt Will Perform Upper Body Bathing: with max assist;bed level;sitting ?Pt Will Perform Lower Body Bathing: sitting/lateral leans;with mod assist ?Pt/caregiver will Perform Home Exercise Program: Increased ROM;Both right and left upper extremity;With minimal assist;With written HEP provided ?Additional ADL Goal #1: Pt will tolerate sitting EOB five minutes with mod A for balance.  ?Plan Discharge plan remains appropriate   ? ?Co-evaluation ? ? ? PT/OT/SLP Co-Evaluation/Treatment: Yes ?Reason for Co-Treatment: Complexity of the patient's impairments (multi-system involvement);For patient/therapist safety;To address functional/ADL transfers ?  ?OT goals addressed during session: ADL's and self-care;Strengthening/ROM ?  ? ?  ?AM-PAC OT "6 Clicks" Daily Activity     ?Outcome Measure ? ? Help from another person eating meals?: Total ?Help from another person taking care of personal grooming?: Total ?Help from another person toileting, which includes using toliet, bedpan, or urinal?: Total ?  Help from another person bathing (including washing, rinsing, drying)?: Total ?Help from another person to put on and taking off regular upper body clothing?: Total ?Help from another person to put on and taking off regular lower body clothing?: Total ?6 Click Score: 6 ? ?  ?End of Session Equipment Utilized During Treatment: Other (comment) (vent) ? ?OT Visit Diagnosis: Other abnormalities of gait and mobility  (R26.89);Unsteadiness on feet (R26.81);Muscle weakness (generalized) (M62.81);Feeding difficulties (R63.3);Other symptoms and signs involving the nervous system (R29.898);Other symptoms and signs involving cognitive functi

## 2021-10-13 NOTE — Consult Note (Signed)
?Consultation Note ?Date: 10/13/2021  ? ?Patient Name: Troy Sampson  ?DOB: [birth year]  MRN: [id number]  Age / Sex: 66 y.o., male  ?PCP: Pcp, No ?Referring Physician: Collene Gobble, MD ? ?Reason for Consultation: Establishing goals of care ? ?HPI/Patient Profile: 66 y.o. male  with past medical history of Duodenal ulcer, anemia, and current smoker admitted on 09/21/2021 with shortness of breath.  Ultimately required intubation due to respiratory failure.  Found to have left lower lobe pneumonia.  Also found to have positive blood cultures.  On March 3 he had an EGD that revealed esophagitis-ongoing anemia.  Per GI no other interventions to offer.  On March 4 patient had tracheostomy placed.  Patient with episodes of bradycardia throughout hospitalization.  Patient also found to have 3 solitary liver masses suspicious for malignancy.  PMT consulted to discuss goals of care. ? ?Clinical Assessment and Goals of Care: ?I have reviewed medical records including EPIC notes, labs and imaging, received report from RN and Dr. Alfonse Spruce, assessed the patient and then met with patient's wife and daughter to discuss diagnosis prognosis, GOC, EOL wishes, disposition and options. ? ?I introduced Palliative Medicine as specialized medical care for people living with serious illness. It focuses on providing relief from the symptoms and stress of a serious illness. The goal is to improve quality of life for both the patient and the family. ? ?They share with me the shock of current situation as patient was independent prior to this hospitalization.  Tell me he was ambulatory without assistance, had a great appetite, and was generally doing "fine". ? ? We discussed patient's current illness and what it means in the larger context of patient's on-going co-morbidities.  Natural disease trajectory and expectations at EOL were discussed.  We discussed patient's prolonged hospitalization.  We  discussed his weakness, frailty, and malnutrition.  We discussed his recurrent aspiration and pneumonia.  We discussed his esophagitis and anemia.  We discussed his liver masses and concern for malignancy.  We discussed his continued dependence on the ventilator. ? ?I attempted to elicit values and goals of care important to the patient.  Both spouse and daughter expressed they do not believe patient would want to live supported by machine.  We discussed that is unlikely for him to return to his independent baseline. ? ?The difference between aggressive medical intervention and comfort care was considered in light of the patient's goals of care.  We discussed what a transition to comfort measures would entail.  Discussed the use of medications to ensure comfort. ? ?Encouraged family to consider DNR status understanding evidenced based poor outcomes in similar hospitalized patients, as the cause of the arrest is likely associated with chronic/terminal disease rather than a reversible acute cardio-pulmonary event.  Family agrees to DNR status-no CPR, they feel this would be in line with patient's wishes as well. ? ?Discussed with family the importance of continued conversation with family and the medical providers regarding overall plan of care and treatment options, ensuring decisions are within the context of the patient?s values and GOCs.   ? ?Family shares that patient has had periods of alertness and they feel like he may be able to participate in conversation.  I offered to return to room and discuss further with patient however patient had just worked with physical therapy and wife politely asked me to not meet with him today as she feels he is too exhausted.  I asked family to attempt to discuss with patient his goals and  wishes as they see windows of alertness with him. ? ?Questions and concerns were addressed. The family was encouraged to call with questions or concerns. ? ?Primary Decision Maker ?NEXT OF  Trellis Paganini ?  ? ?SUMMARY OF RECOMMENDATIONS   ?-Change CODE STATUS to NO CPR ?-Family seems to indicate patient would not want long-term ventilator support however they do request time to attempt to speak to patient about this and discuss with other family members ?-Briefly introduced comfort focused care ?-We will follow-up tomorrow March 16 ? ?Code Status/Advance Care Planning: ?Limited code-no CPR ? ?  ? ?Primary Diagnoses: ?Present on Admission: ? Acute respiratory failure with hypoxia ([place]) ? Sepsis ([place]) ? Pressure injury of skin ? Pneumonia, pneumococcal ([place]) ? Pneumococcal bacteremia ? Acute metabolic encephalopathy ? Alteration in electrolyte and fluid balance ? Hepatitis C ? Liver mass ? Anemia ? Thrombocytopenia ([place]) ? Protein-calorie malnutrition, severe ? ? ?I have reviewed the medical record, interviewed the patient and family, and examined the patient. The following aspects are pertinent. ? ?History reviewed. No pertinent past medical history. ?Social History  ? ?Socioeconomic History  ? Marital status: Married  ?  Spouse name: Not on file  ? Number of children: Not on file  ? Years of education: Not on file  ? Highest education level: Not on file  ?Occupational History  ? Not on file  ?Tobacco Use  ? Smoking status: Every Day  ?  Types: Cigarettes  ? Smokeless tobacco: Not on file  ?Substance and Sexual Activity  ? Alcohol use: Yes  ? Drug use: Never  ? Sexual activity: Not on file  ?Other Topics Concern  ? Not on file  ?Social History Narrative  ? Not on file  ? ?Social Determinants of Health  ? ?Financial Resource Strain: Not on file  ?Food Insecurity: Not on file  ?Transportation Needs: Not on file  ?Physical Activity: Not on file  ?Stress: Not on file  ?Social Connections: Not on file  ? ?History reviewed. No pertinent family history. ?Scheduled Meds: ? sodium chloride   Intravenous Once  ? atorvastatin  40 mg Per Tube Daily  ? chlorhexidine gluconate (MEDLINE KIT)  15 mL Mouth Rinse BID   ? Chlorhexidine Gluconate Cloth  6 each Topical Daily  ? feeding supplement (PROSource TF)  45 mL Per Tube BID  ? folic acid  1 mg Per Tube Daily  ? free water  200 mL Per Tube Q4H  ? guaiFENesin  10 mL Per Tube Q6H  ? insulin aspart  0-9 Units Subcutaneous Q4H  ? ipratropium-albuterol  3 mL Nebulization Q6H  ? mouth rinse  15 mL Mouth Rinse 10 times per day  ? multivitamin with minerals  1 tablet Per Tube Daily  ? pantoprazole (PROTONIX) IV  40 mg Intravenous Q12H  ? sodium chloride flush  10-40 mL Intracatheter Q12H  ? sodium chloride flush  10-40 mL Intracatheter Q12H  ? thiamine  100 mg Per Tube Daily  ? ?Continuous Infusions: ? sodium chloride 10 mL/hr at 10/01/21 1500  ? sodium chloride 5 mL/hr at 10/13/21 0358  ? sodium chloride Stopped (10/12/21 0932)  ? ampicillin-sulbactam (UNASYN) IV Stopped (10/13/21 0935)  ? dextrose 30 mL/hr at 10/13/21 1204  ? DOPamine Stopped (10/12/21 2349)  ? feeding supplement (VITAL 1.5 CAL) 1,000 mL (10/12/21 1219)  ? ?PRN Meds:.Place/Maintain arterial line **AND** sodium chloride, sodium chloride, albuterol, atropine, docusate, fentaNYL (SUBLIMAZE) injection, hydrALAZINE, ondansetron (ZOFRAN) IV, oxyCODONE, polyethylene glycol, sodium chloride flush, sodium chloride flush,  white petrolatum ?No Known Allergies ?Review of Systems  ?Unable to perform ROS: Intubated  ? ?Physical Exam ?Constitutional:   ?   General: He is not in acute distress. ?   Appearance: He is ill-appearing.  ?   Comments: Frail, thin, unable to lift up head to my voice  ?Pulmonary:  ?   Effort: Pulmonary effort is normal.  ?Skin: ?   General: Skin is warm and dry.  ? ? ?Vital Signs: BP 123/64   Pulse 84   Temp (!) 100.4 ?F (38 ?C) (Axillary)   Resp (!) 23   Ht _0  (1.702 m)   Wt 51.8 kg   SpO2 97%   BMI 17.89 kg/m?  ?Pain Scale: 0-10 ?  ?Pain Score: 0-No pain ? ? ?SpO2: SpO2: 97 % ?O2 Device:SpO2: 97 % ?O2 Flow Rate: .O2 Flow Rate (L/min): 10 L/min ? ?IO: Intake/output summary:  ?Intake/Output  Summary (Last 24 hours) at 10/13/2021 1252 ?Last data filed at 10/13/2021 1204 ?Gross per 24 hour  ?Intake 2135.99 ml  ?Output 1750 ml  ?Net 385.99 ml  ? ? ?LBM: Last BM Date : 10/13/21 ?Baseline Weight: Weight: 46.3 kg ?

## 2021-10-13 NOTE — Progress Notes (Signed)
Physical Therapy Treatment ?Patient Details ?Name: Troy Sampson ?MRN: [id number] ?DOB: 1955-08-18 ?Today's Date: 10/13/2021 ? ? ?History of Present Illness Pt is a 66 y/o male who presented to the ED 2/22 with SOB. After arrival, pt became obtunded due to severe hypoxia and subsequently intubated emergently. Pt admitted with dx of acute hypoxic/hypercapnic respiratory failure 2nd to community acquired pneumonia.  Further testing revealed Hep C, suspected COPD, and potential underlying malignancy. Pt underwent tracheostomy 3/4. PMHx: duodenal ulcer, IDA, nephrolithiasis, ETOH ? ?  ?PT Comments  ? ? Pt wife present during beginning of session, recalling 41 years of marriage and how he was late to the wedding. Pt mostly with eyes closed, however smiles when wife is telling story. Pt able to move all four extremities and agreeable to out of bed. Pt is total A for transfer to EoB where he requires modA for maintaining balance. Pt unable to utilize Stedy due to increased fatigue ultimately requiring total A x 2 for pivot to drop arm recliner. Pending progress d/c plan remains appropriate, however Palliative has been consulted. PT will continue to follow acutely.  ? ?VSS on 40% PRVC vent via trach, PEEP 5  ?  ?Recommendations for follow up therapy are one component of a multi-disciplinary discharge planning process, led by the attending physician.  Recommendations may be updated based on patient status, additional functional criteria and insurance authorization. ? ?Follow Up Recommendations ? Acute inpatient rehab (3hours/day) (pending progress) ?  ?  ?Assistance Recommended at Discharge Frequent or constant Supervision/Assistance  ?Patient can return home with the following Two people to help with walking and/or transfers;Two people to help with bathing/dressing/bathroom;Assistance with cooking/housework;Assistance with feeding;Direct supervision/assist for medications management;Direct supervision/assist for financial  management;Assist for transportation;Help with stairs or ramp for entrance;Other (comment) (hoyer lift) ?  ?Equipment Recommendations ? Hospital bed;BSC/3in1;Wheelchair (measurements PT);Wheelchair cushion (measurements PT) (hoyer lift)  ?  ?Recommendations for Other Services Rehab consult ? ? ?  ?Precautions / Restrictions Precautions ?Precautions: Fall;Other (comment) ?Precaution Comments: vent via trach, NG tube, flexiseal ?Restrictions ?Weight Bearing Restrictions: No  ?  ? ?Mobility ? Bed Mobility ?Overal bed mobility: Needs Assistance ?Bed Mobility: Supine to Sit ?  ?  ?Supine to sit: Total assist, +2 for physical assistance ?  ?  ?General bed mobility comments: pt able to activate all 4 exteremities  however he still requires total A for to come to sitting EoB ?  ? ?Transfers ?Overall transfer level: Needs assistance ?Equipment used: 2 person hand held assist ?Transfers: Bed to chair/wheelchair/BSC ?  ?  ?  ?Squat pivot transfers: Total assist, +2 physical assistance, +2 safety/equipment ?  ?  ?General transfer comment: Opted for squat pivot to chair with pt fatigue sitting EOB, Total A x 2 to scoot towards recliner and then pivot over to drop arm recliner ?  ? ?  ? ? ?  ?Balance Overall balance assessment: Needs assistance ?Sitting-balance support: Bilateral upper extremity supported, Feet supported ?Sitting balance-Leahy Scale: Poor ?Sitting balance - Comments: has more core activation but still requires outside support to maintain balance (overall Min A to Mod A with fatigue) ?  ?  ?  ?  ?  ?  ?  ?  ?  ?  ?  ?  ?  ?  ?  ?  ? ?  ?Cognition Arousal/Alertness: Awake/alert ?Behavior During Therapy: Flat affect ?Overall Cognitive Status: Impaired/Different from baseline ?Area of Impairment: Attention, Following commands, Safety/judgement, Awareness, Problem solving ?  ?  ?  ?  ?  ?  ?  ?  ?  ?  Current Attention Level: Sustained ?  ?Following Commands: Follows one step commands with increased  time ?Safety/Judgement: Decreased awareness of safety, Decreased awareness of deficits ?Awareness: Intellectual ?Problem Solving: Slow processing, Decreased initiation, Difficulty sequencing, Requires verbal cues, Requires tactile cues ?General Comments: flat affect, follows one step commands relatively well (may also be HOH). slower processing and delayed motor planning at times ?  ?  ? ?  ?   ?General Comments General comments (skin integrity, edema, etc.): Wife initially present then stepped out to speak with palliative staff ?  ?  ? ?Pertinent Vitals/Pain Pain Assessment ?Pain Assessment: Faces ?Faces Pain Scale: Hurts a little bit ?Pain Location: generalized with movement ?Pain Descriptors / Indicators: Grimacing, Discomfort ?Pain Intervention(s): Limited activity within patient's tolerance, Monitored during session, Repositioned  ? ? ? ?PT Goals (current goals can now be found in the care plan section) Acute Rehab PT Goals ?PT Goal Formulation: Patient unable to participate in goal setting ?Time For Goal Achievement: 10/17/21 ?Potential to Achieve Goals: Poor ?Progress towards PT goals: Progressing toward goals ? ?  ?Frequency ? ? ? Min 3X/week ? ? ? ?  ?PT Plan Discharge plan needs to be updated  ? ? ?Co-evaluation PT/OT/SLP Co-Evaluation/Treatment: Yes ?Reason for Co-Treatment: Complexity of the patient's impairments (multi-system involvement) ?PT goals addressed during session: Mobility/safety with mobility ?  ?  ? ?  ?AM-PAC PT "6 Clicks" Mobility   ?Outcome Measure ? Help needed turning from your back to your side while in a flat bed without using bedrails?: Total ?Help needed moving from lying on your back to sitting on the side of a flat bed without using bedrails?: Total ?Help needed moving to and from a bed to a chair (including a wheelchair)?: Total ?Help needed standing up from a chair using your arms (e.g., wheelchair or bedside chair)?: Total ?Help needed to walk in hospital room?: Total ?Help  needed climbing 3-5 steps with a railing? : Total ?6 Click Score: 6 ? ?  ?End of Session Equipment Utilized During Treatment: Oxygen (vent via trach) ?Activity Tolerance: Patient limited by lethargy;Patient limited by fatigue ?Patient left: with call bell/phone within reach;with chair alarm set;in bed;with bed alarm set;with nursing/sitter in room ?Nurse Communication: Mobility status ?PT Visit Diagnosis: Other abnormalities of gait and mobility (R26.89);Muscle weakness (generalized) (M62.81) ?  ? ? ?Time: 3382-5053 ?PT Time Calculation (min) (ACUTE ONLY): 35 min ? ?Charges:  $Therapeutic Activity: 8-22 mins          ?          ? ?Clema Skousen B. Beverely Risen PT, DPT ?Acute Rehabilitation Services ?Pager [phone] ?Office [phone] ? ? ? ?Elon Alas Fleet ?10/13/2021, 3:07 PM ? ?

## 2021-10-13 NOTE — Progress Notes (Signed)
? ?NAME:  Troy Sampson, MRN:  [id number], DOB:  04-28-1956, LOS: 21 ?ADMISSION DATE:  09/21/2021, CONSULTATION DATE:  09/22/2021 ?REFERRING MD:  [facility], CHIEF COMPLAINT:  Shortness of Breath  ? ?History of Present Illness:  ?66 y/o male with a PMHx of duodenal ulcer, IDA, nephrolithiasis and a current smoker who presented from [facility] with c/o SOB x 1 week. History obtained by his wife due to patient arrived obtunded, markedly hypoxic oxygen sats in the low 80s. Patient placed on BiPAP and given Ativan. He was fairly tachypnis with RR in 40s as well as tachycardia and was ultimately intubated for acute hypoxic respiratory failure and increased work of breathing. Noted to have a fever of 101.8. CXR show a LLL pneumonia. PCCM consulted for ICU admission.  ? 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 ?Significant Hospital Events: ?Including procedures, antibiotic start and stop dates in addition to other pertinent events   ?2/22: Admitted overnight to ICU. Blood cultures 3/4 positive for strep pneumo. Strep pneumo antigen positive, TTE with EF 50 to 55% no wall motion abnormalities ?2/26: New onset fevers. Antibiotics broadened to Cefepime and Vancomycin. Propofol switched to Precedex.  ?2/27: Bradycardia noted on Precedex with increased tachypnea.  ?2/28, antibiotics changed to penicillin G per infectious disease recommendation ?3/1: Vasoactive drip requirements improving, received 1 unit of blood for hemoglobin of 6.1 ?3/2 follow-up hemoglobin 6.8, received second unit of blood, has also received 3 units of platelets since admission.  Off norepinephrine  ?3/3 EGD> grade D esophagitis with bleeding ?3/4 tracheostomy ?3/7 ongoing tachypnea limiting SBT. Unable to SBT at all this morning. Overnight episode of bradycardia when being turned requiring atropine ? ?Interim History / Subjective:  ?Overnight patient had episode of bradycardia in the  20s.  Stat EKG appears unremarkable.  Troponin elevated but flat.  p.m. metoprolol was held.  He was started on dopamine for a brief period at 11 PM.  External pacing were also applied. ? ?There was also bloody secretion from his airway.  Chest x-ray does show improvement of left pleural effusion. ? ?Objective   ?Blood pressure 123/64, pulse 100, temperature 99.9 ?F (37.7 ?C), temperature source Oral, resp. rate (!) 25, height 5\' 7"  (1.702 m), weight 50.4 kg, SpO2 100 %. ?   ?Vent Mode: PRVC ?FiO2 (%):  [40 %] 40 % ?Set Rate:  [18 bmp] 18 bmp ?Vt Set:  [500 mL] 500 mL ?PEEP:  [8 cmH20] 8 cmH20 ?Pressure Support:  [8 cmH20] 8 cmH20 ?Plateau Pressure:  [16 cmH20-24 cmH20] 24 cmH20  ? ?Intake/Output Summary (Last 24 hours) at 10/13/2021 0724 ?Last data filed at 10/13/2021 0700 ?Gross per 24 hour  ?Intake 6298.26 ml  ?Output 1950 ml  ?Net 4348.26 ml  ? ? ?Filed Weights  ? 10/10/21 0325 10/11/21 0340 10/12/21 0500  ?Weight: 52.9 kg 48 kg 50.4 kg  ? ?Physical exam: ?General: Crtitically ill-appearing male, trach dependence ?HEENT: [place]/AT, eyes anicteric. Cortrak in place ?Neuro: Somnolent but awakes to verbal stimulation.  Fall back to sleep 3.  ?Pul: No wheezes ?Heart: regular rate and rhythm, no murmurs or gallops ?Abdomen: mild distention, bowel sound present. ?GU: blood tinge urine  ?Skin: No rash ?Extremity: +1 edema bilateral upper extremity. ? ?Resolved Hospital Problem list   ?Septic shock ?Lactic acidosis ?Acute urinary retention ?Thrombocytopenia ?Type 2 MI ?Urinary retention ? ?Assessment & Plan:  ?Acute hypoxic and hypercapnic respiratory failure s/p trach ?Probable COPD ?Continue lung  protective ventilation ?Continue SBT trial.  ?VAP prevention place ?CPT scheduled ?Continue trach care per protocol ? ?Severe sepsis due to Strep pneumo CAP + bacteremia-finished 14 dpenicillin G ?New fever and leukocytosis with recurrence/persistence of pneumonia + aspiration ?Tmax 100.7 overnight.  White count stable.   ?Blood  culture grew [place], likely contamination. Pending TTE to rule out endocarditis  ?Continue Unasyn per ID for aspiration. Appreciate ID recommendations.  Chest x-ray showed improvement of the left pleural effusion. ?His stool output has increased.  Lower suspicion for C. difficile. ? ?Acute on chronic blood loss anemia ?Upper GI bleeding from esophagitis, grade D ?Hemoglobin continues to trend down after 1 unit of blood.  Suspect ongoing GI bleed. ?GI does not have any additional recommendation.  No aggressive interventions.  Continue supportive care. ?Hold heparin for DVT proph until hemoglobin stable ?Hold aspirin ?Continue PPI 40 mg twice daily, switch IV ?Check CBC this afternoon ? ?Bradycardia ?Hypotension ?Questionable mucous plug.  He now has an external pacing ?-Hold metoprolol and amlodipine ?-Continue suction.  Keep PEEP low ? ?Severe protein calorie malnutrition, cachexia ?Hypernatremia-resolved ?Continue TF through cortrak ?Continue free water flushes 200 cc Q2h ?Continue D5 30 cc/h ? ?HLDX ?cont statin ?  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 PSA minimally elevated so low suspicion for that being primary. AFP negative. Discussed pursuing IR biopsy in the future with family but given critical illness, patient is not currently stable enough. Suspect this is chronic given last IVDA occurred over 30 years ago. Viral load elevated at 27, 200,000.  ?LFTs are elevated ?Holding Tylenol ? ?Tobacco dependence ?Continue nicotine patch ?Smoking cessation counseling provided ? ?Goal of care ?Hopefully palliative team will meet with family to discussed long-term goal ? ?Best Practice (right click and "Reselect all SmartList Selections" daily)  ? ?Diet/type: tubefeeds ?DVT prophylaxis: SCD ?GI prophylaxis: PPI ?Lines: N/A ?Foley:   N/A ?Code Status:  full code ?Last date of multidisciplinary goals of care discussion: will update family ?Critical care time: ?  ? ?Doran Stabler, DO ? ?

## 2021-10-13 NOTE — Progress Notes (Signed)
eLink Physician-Brief Progress Note ?Patient Name: Troy Sampson ?DOB: 01-02-1956 ?MRN: [id number] ? ? ?Date of Service ? 10/13/2021  ?HPI/Events of Note ? Patient needs D 5 % gtt order renewed to prevent hypoglycemia while enteral nutrition is on hold.  ?eICU Interventions ? D 5 % gtt re-ordered at 30 ml / hour x 24 hours.  ? ? ? ?  ? ?Migdalia Dk ?10/13/2021, 11:37 PM ?

## 2021-10-14 LAB — GLUCOSE, CAPILLARY
Glucose-Capillary: 109 mg/dL — ABNORMAL HIGH (ref 70–99)
Glucose-Capillary: 114 mg/dL — ABNORMAL HIGH (ref 70–99)
Glucose-Capillary: 121 mg/dL — ABNORMAL HIGH (ref 70–99)
Glucose-Capillary: 123 mg/dL — ABNORMAL HIGH (ref 70–99)
Glucose-Capillary: 124 mg/dL — ABNORMAL HIGH (ref 70–99)
Glucose-Capillary: 124 mg/dL — ABNORMAL HIGH (ref 70–99)
Glucose-Capillary: 88 mg/dL (ref 70–99)

## 2021-10-14 LAB — COMPREHENSIVE METABOLIC PANEL
ALT: 99 U/L — ABNORMAL HIGH (ref 0–44)
AST: 142 U/L — ABNORMAL HIGH (ref 15–41)
Albumin: <1.5 g/dL — ABNORMAL LOW (ref 3.5–5.0)
Alkaline Phosphatase: 226 U/L — ABNORMAL HIGH (ref 38–126)
Anion gap: 8 (ref 5–15)
BUN: 22 mg/dL (ref 8–23)
CO2: 26 mmol/L (ref 22–32)
Calcium: 7.5 mg/dL — ABNORMAL LOW (ref 8.9–10.3)
Chloride: 109 mmol/L (ref 98–111)
Creatinine, Ser: 0.41 mg/dL — ABNORMAL LOW (ref 0.61–1.24)
GFR, Estimated: >60 mL/min (ref >60)
Glucose, Bld: 113 mg/dL — ABNORMAL HIGH (ref 70–99)
Potassium: 4.2 mmol/L (ref 3.5–5.1)
Sodium: 143 mmol/L (ref 135–145)
Total Bilirubin: 0.7 mg/dL (ref 0.3–1.2)
Total Protein: 5.1 g/dL — ABNORMAL LOW (ref 6.5–8.1)

## 2021-10-14 LAB — CBC
HCT: 23.2 % — ABNORMAL LOW (ref 39.0–52.0)
Hemoglobin: 7.5 g/dL — ABNORMAL LOW (ref 13.0–17.0)
MCH: 31.4 pg (ref 26.0–34.0)
MCHC: 32.3 g/dL (ref 30.0–36.0)
MCV: 97.1 fL (ref 80.0–100.0)
Platelets: 318 10*3/uL (ref 150–400)
RBC: 2.39 MIL/uL — ABNORMAL LOW (ref 4.22–5.81)
RDW: 15.7 % — ABNORMAL HIGH (ref 11.5–15.5)
WBC: 26.4 10*3/uL — ABNORMAL HIGH (ref 4.0–10.5)
nRBC: 0.2 % (ref 0.0–0.2)

## 2021-10-14 MED ORDER — NICOTINE 7 MG/24HR TD PT24
7.0000 mg | MEDICATED_PATCH | Freq: Every day | TRANSDERMAL | Status: DC
  Administered 2021-10-14 – 2021-10-22 (×9): 7 mg via TRANSDERMAL
  Filled 2021-10-14 (×10): qty 1

## 2021-10-14 MED ORDER — FREE WATER
200.0000 mL | Freq: Four times a day (QID) | Status: DC
  Administered 2021-10-14 – 2021-10-17 (×12): 200 mL

## 2021-10-14 MED ORDER — FAMOTIDINE 20 MG PO TABS
20.0000 mg | ORAL_TABLET | Freq: Every day | ORAL | Status: DC
  Administered 2021-10-14 – 2021-10-22 (×9): 20 mg
  Filled 2021-10-14 (×9): qty 1

## 2021-10-14 NOTE — Progress Notes (Signed)
?  [facility] ? ? ?Reason for visit: follow up on pneumonia ? ?Interval History: Tmax 99.5, WBC 26.4.  Daughter at bedside ? Day 5 ampicillin/sulbactam ? ?Physical Exam: ?Constitutional:  ?Vitals:  ? 10/14/21 0800 10/14/21 0900  ?BP: 112/66 132/74  ?Pulse: 84 96  ?Resp: (!) 21 17  ?Temp:    ?SpO2: 99% 97%  ?In bed, nad ?HENT + trach ?Skin: no rashes ? ? ?Review of Systems: ?Unable to be assessed due to patient factors ? ?Lab Results  ?Component Value Date  ? WBC 26.4 (H) 10/14/2021  ? HGB 7.5 (L) 10/14/2021  ? HCT 23.2 (L) 10/14/2021  ? MCV 97.1 10/14/2021  ? PLT 318 10/14/2021  ?  ?Lab Results  ?Component Value Date  ? CREATININE 0.41 (L) 10/14/2021  ? BUN 22 10/14/2021  ? NA 143 10/14/2021  ? K 4.2 10/14/2021  ? CL 109 10/14/2021  ? CO2 26 10/14/2021  ?  ?Lab Results  ?Component Value Date  ? ALT 99 (H) 10/14/2021  ? AST 142 (H) 10/14/2021  ? ALKPHOS 226 (H) 10/14/2021  ?  ? ?Microbiology: ?Recent Results (from the past 240 hour(s))  ?Culture, Respiratory w Gram Stain     Status: None  ? Collection Time: 10/06/21  9:55 AM  ? Specimen: Tracheal Aspirate; Respiratory  ?Result Value Ref Range Status  ? Specimen Description TRACHEAL ASPIRATE  Final  ? Special Requests NONE  Final  ? Gram Stain   Final  ?  ABUNDANT WBC PRESENT, PREDOMINANTLY PMN ?RARE BUDDING YEAST SEEN ?  ? Culture   Final  ?  FEW CANDIDA TROPICALIS ?NO STAPHYLOCOCCUS AUREUS ISOLATED ?No Pseudomonas species isolated ?Performed at [facility], [address], [place], [place] [address] ?  ? Report Status 10/08/2021 FINAL  Final  ?Culture, blood (routine x 2)     Status: None  ? Collection Time: 10/07/21  8:25 AM  ? Specimen: BLOOD  ?Result Value Ref Range Status  ? Specimen Description BLOOD THUMB  Final  ? Special Requests   Final  ?  BOTTLES DRAWN AEROBIC ONLY Blood Culture results may not be optimal due to an inadequate volume of blood received in culture bottles  ? Culture   Final  ?  NO GROWTH 5 DAYS ?Performed  at [facility], [address] [place], [place] [address] ?  ? Report Status 10/12/2021 FINAL  Final  ?Culture, blood (routine x 2)     Status: Abnormal  ? Collection Time: 10/07/21  8:36 AM  ? Specimen: BLOOD LEFT HAND  ?Result Value Ref Range Status  ? Specimen Description BLOOD LEFT HAND  Final  ? Special Requests   Final  ?  BOTTLES DRAWN AEROBIC ONLY Blood Culture adequate volume  ? Culture  Setup Time GRAM POSITIVE COCCI ?AEROBIC BOTTLE ONLY ?  Final  ? Culture (A)  Final  ?  KOCURIA SPECIES ?THE SIGNIFICANCE OF ISOLATING THIS ORGANISM FROM A SINGLE SET OF BLOOD CULTURES WHEN MULTIPLE SETS ARE DRAWN IS UNCERTAIN. PLEASE NOTIFY THE MICROBIOLOGY DEPARTMENT WITHIN ONE WEEK IF SPECIATION AND SENSITIVITIES ARE REQUIRED. ?Performed at [facility], [address] [place], [place] [address] ?  ? Report Status 10/11/2021 FINAL  Final  ?Blood Culture ID Panel (Reflexed)     Status: None  ? Collection Time: 10/07/21  8:36 AM  ?Result Value Ref Range Status  ? Enterococcus faecalis NOT DETECTED NOT DETECTED Final  ? Enterococcus Faecium NOT DETECTED NOT DETECTED  Final  ? Listeria monocytogenes NOT DETECTED NOT DETECTED Final  ? Staphylococcus species NOT DETECTED NOT DETECTED Final  ? Staphylococcus aureus (BCID) NOT DETECTED NOT DETECTED Final  ? Staphylococcus epidermidis NOT DETECTED NOT DETECTED Final  ? Staphylococcus lugdunensis NOT DETECTED NOT DETECTED Final  ? Streptococcus species NOT DETECTED NOT DETECTED Final  ? Streptococcus agalactiae NOT DETECTED NOT DETECTED Final  ? Streptococcus pneumoniae NOT DETECTED NOT DETECTED Final  ? Streptococcus pyogenes NOT DETECTED NOT DETECTED Final  ? A.calcoaceticus-baumannii NOT DETECTED NOT DETECTED Final  ? Bacteroides fragilis NOT DETECTED NOT DETECTED Final  ? Enterobacterales NOT DETECTED NOT DETECTED Final  ? Enterobacter cloacae complex NOT DETECTED NOT DETECTED Final  ? Escherichia coli NOT DETECTED NOT DETECTED Final  ? Klebsiella aerogenes NOT  DETECTED NOT DETECTED Final  ? Klebsiella oxytoca NOT DETECTED NOT DETECTED Final  ? Klebsiella pneumoniae NOT DETECTED NOT DETECTED Final  ? Proteus species NOT DETECTED NOT DETECTED Final  ? Salmonella species NOT DETECTED NOT DETECTED Final  ? Serratia marcescens NOT DETECTED NOT DETECTED Final  ? Haemophilus influenzae NOT DETECTED NOT DETECTED Final  ? Neisseria meningitidis NOT DETECTED NOT DETECTED Final  ? Pseudomonas aeruginosa NOT DETECTED NOT DETECTED Final  ? Stenotrophomonas maltophilia NOT DETECTED NOT DETECTED Final  ? Candida albicans NOT DETECTED NOT DETECTED Final  ? Candida auris NOT DETECTED NOT DETECTED Final  ? Candida glabrata NOT DETECTED NOT DETECTED Final  ? Candida krusei NOT DETECTED NOT DETECTED Final  ? Candida parapsilosis NOT DETECTED NOT DETECTED Final  ? Candida tropicalis NOT DETECTED NOT DETECTED Final  ? Cryptococcus neoformans/gattii NOT DETECTED NOT DETECTED Final  ?  Comment: Performed at [facility], [address] [place], [place] [address]  ? ? ?Impression/Plan:  ?Aspiration pneumonia - improving WBC and fever curve.  Will continue with ampicillin/sulbactam through tomorrow then stop.  ? ?2.  Fever - fever curve trending down.  Will continue to monitor. ? ?3. Leukocytosis - likely from #1 and from his underlying illness and is stable.  Will continue to monitor.  ? ?  ?

## 2021-10-14 NOTE — Progress Notes (Addendum)
?                                                   ?Daily Progress Note  ? ?Patient Name: Troy Sampson       Date: 10/14/2021 ?DOB: [birth year]  Age: 66 y.o. MRN#: [id number] ?Attending Physician: Collene Gobble, MD ?Primary Care Physician: Pcp, No ?Admit Date: 09/21/2021 ? ?Reason for Consultation/Follow-up: Establishing goals of care ? ?Subjective: ?Patient sitting up in chair, shakes his head no when asked if he is in pain ?Some interaction but difficult to determine what patient attempts to communicate-communication significantly impacted by patient's lethargy ? ?Length of Stay: 22 ? ?Current Medications: ?Scheduled Meds:  ? sodium chloride   Intravenous Once  ? atorvastatin  40 mg Per Tube Daily  ? chlorhexidine gluconate (MEDLINE KIT)  15 mL Mouth Rinse BID  ? Chlorhexidine Gluconate Cloth  6 each Topical Daily  ? famotidine  20 mg Per Tube Daily  ? feeding supplement (PROSource TF)  45 mL Per Tube BID  ? folic acid  1 mg Per Tube Daily  ? free water  200 mL Per Tube Q6H  ? guaiFENesin  10 mL Per Tube Q6H  ? insulin aspart  0-9 Units Subcutaneous Q4H  ? ipratropium-albuterol  3 mL Nebulization Q6H  ? mouth rinse  15 mL Mouth Rinse 10 times per day  ? multivitamin with minerals  1 tablet Per Tube Daily  ? pantoprazole (PROTONIX) IV  40 mg Intravenous Q12H  ? sodium chloride flush  10-40 mL Intracatheter Q12H  ? sodium chloride flush  10-40 mL Intracatheter Q12H  ? thiamine  100 mg Per Tube Daily  ? ? ?Continuous Infusions: ? sodium chloride 10 mL/hr at 10/01/21 1500  ? sodium chloride 5 mL/hr at 10/14/21 0011  ? ampicillin-sulbactam (UNASYN) IV 3 g (10/14/21 0803)  ? feeding supplement (VITAL 1.5 CAL) 1,000 mL (10/13/21 2236)  ? ? ?PRN Meds: ?Place/Maintain arterial line **AND** sodium chloride, sodium chloride, albuterol, atropine, docusate, fentaNYL (SUBLIMAZE) injection,  hydrALAZINE, oxyCODONE, polyethylene glycol, sodium chloride flush, sodium chloride flush, white petrolatum ? ?Physical Exam ?Constitutional:   ?   General: He is not in acute distress. ?   Appearance: He is ill-appearing.  ?   Comments: Frail, cachectic  ?Pulmonary:  ?   Effort: Pulmonary effort is normal.  ?Skin: ?   General: Skin is warm and dry.  ?Neurological:  ?   Comments: Difficult to assess  ?         ? ?Vital Signs: BP 116/67   Pulse 80   Temp 98.2 ?F (36.8 ?C) (Oral)   Resp (!) 22   Ht 5' 7"  (1.702 m)   Wt 52.5 kg   SpO2 98%   BMI 18.13 kg/m?  ?SpO2: SpO2: 98 % ?O2 Device: O2 Device: Ventilator ?O2 Flow Rate: O2 Flow Rate (L/min): 10 L/min ? ?Intake/output summary:  ?Intake/Output Summary (Last 24 hours) at 10/14/2021 1416 ?Last data filed at 10/14/2021 0800 ?Gross per 24 hour  ?Intake 2007.56 ml  ?Output 1250 ml  ?Net 757.56 ml  ? ?LBM: Last BM Date : 10/14/21 ?Baseline Weight: Weight: 46.3 kg ?Most recent weight: Weight: 52.5 kg ? ?    ? ?Flowsheet Rows   ? ?Flowsheet Row Most Recent Value  ?Intake Tab   ?Referral Department Critical care  ?Unit at  Time of Referral ICU  ?Palliative Care Primary Diagnosis Sepsis/Infectious Disease  ?Date Notified 10/12/21  ?Palliative Care Type New Palliative care  ?Reason for referral Clarify Goals of Care  ?Date of Admission 09/22/21  ?Date first seen by Palliative Care 10/13/21  ?# of days Palliative referral response time 1 Day(s)  ?# of days IP prior to Palliative referral 20  ?Clinical Assessment   ?Psychosocial & Spiritual Assessment   ?Palliative Care Outcomes   ? ?  ? ? ?Patient Active Problem List  ? Diagnosis Date Noted  ? Status post tracheostomy ([place])   ? Protein-calorie malnutrition, severe 10/01/2021  ? Pressure injury of skin 09/30/2021  ? Pneumonia, pneumococcal ([place]) 09/30/2021  ? Pneumococcal bacteremia 09/30/2021  ? Acute metabolic encephalopathy 18/29/9371  ? Alteration in electrolyte and fluid balance 09/30/2021  ? Hepatitis C 09/30/2021  ?  Liver mass 09/30/2021  ? Anemia 09/30/2021  ? NSTEMI (non-ST elevated myocardial infarction) ([place]) 09/30/2021  ? Thrombocytopenia ([place]) 09/30/2021  ? Acute respiratory failure ([place]) 09/22/2021  ? Protein calorie malnutrition ([place]) 09/22/2021  ? Acute respiratory failure with hypoxia ([place])   ? Sepsis ([place])   ? Community acquired pneumonia of left lower lobe of lung 09/21/2021  ? ? ?Palliative Care Assessment & Plan  ? ?HPI: ?66 y.o. male  with past medical history of Duodenal ulcer, anemia, and current smoker admitted on 09/21/2021 with shortness of breath.  Ultimately required intubation due to respiratory failure.  Found to have left lower lobe pneumonia.  Also found to have positive blood cultures.  On March 3 he had an EGD that revealed esophagitis-ongoing anemia.  Per GI no other interventions to offer.  On March 4 patient had tracheostomy placed.  Patient with episodes of bradycardia throughout hospitalization.  Patient also found to have 3 solitary liver masses suspicious for malignancy.  PMT consulted to discuss goals of care. ? ?Assessment: ?Follow-up today at patient's bedside.  Patient sitting up in chair, slightly more interactive today than when I saw him yesterday.  He does still seem lethargic.  He attempts to communicate with me through head nods but difficult to interpret his meaning. ? ?Spoke with patient's daughter via telephone.  She tells me she was at bedside this morning and was able to speak with the critical care physician.  She tells me she also attempted to have goals of care conversations with her father but these were also nonproductive.  She also shares she was able to see trials of Passy-Muir valve and was hoping to be able to communicate with him some during this time however this was unable to be achieved. ? ?We reviewed patient's complicated hospital stay.  We reviewed the concern that patient will be unable to be weaned from ventilator.  We discussed that if he remains dependent on  the ventilator he will require a long-term ventilator facility.  I asked patient's daughter to consider what her dad would accept as good quality of life. Daughter shares that she knows her dad well and knows that he would not consider being kept alive on a ventilator good quality of life. ? ?We also again reviewed concern of malignancy and discussed how this further complicates situation. ? ?Daughter asks about what comfort measures only would look like and so I detailed this for her. ? ?She tells me she is coming back to the hospital again later today and is going to try again to discern what her dad wants.  She is open to ongoing conversations with the  palliative medicine team.  We made a plan to follow-up again tomorrow and she also has my phone number to call. ? ?Recommendations/Plan: ?Ongoing conversations with patient and family to determine goals of care ?Family seems to indicate that patient would not want long-term ventilator however they are requesting time for further conversations ?Maintain no CPR ?Briefly introduced comfort focused care ?We will follow-up tomorrow March 17 ? ?Code Status: ?Limited code - no CPR ? ?Care plan was discussed with patient and daughter ? ?Thank you for allowing the Palliative Medicine Team to assist in the care of this patient. ? ?*Please note that this is a verbal dictation therefore any spelling or grammatical errors are due to the "[employer]" system interpretation. ? ?Juel Burrow, DNP, AGNP-C ?Palliative Medicine Team ?Team Phone # [phone]  ?Pager [phone] ? ?

## 2021-10-14 NOTE — Evaluation (Signed)
Passy-Muir Speaking Valve - Evaluation ?Patient Details  ?Name: Troy Sampson ?MRN: [id number] ?Date of Birth: [birth year] ? ?Today's Date: 10/14/2021 ?Time: 1107-1130 ?SLP Time Calculation (min) (ACUTE ONLY): 23 min ? ?Past Medical History: History reviewed. No pertinent past medical history. ?Past Surgical History:  ?Past Surgical History:  ?Procedure Laterality Date  ? ESOPHAGOGASTRODUODENOSCOPY N/A 10/01/2021  ? Procedure: ESOPHAGOGASTRODUODENOSCOPY (EGD);  Surgeon: Jeani Hawking, MD;  Location: [facility] ENDOSCOPY;  Service: Gastroenterology;  Laterality: N/A;  Bedside  ? ?HPI:  ?66 y/o male who presented from [facility] with c/o SOB x 1 week. Pt intubated for acute hypoxic respiratory failure and increased work of breathing. Noted to have a fever of 101.8. CXR show a LLL pneumonia. Intubated from 2/22 to 3/4 when trach placed. 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 Palliative care involved as prognosis for meaningful recovery is poor. Pt has not been able to achieve trach collar.  ? ? ?Assessment / Plan / Recommendation ? ?Clinical Impression ? Pt seen for initial trial with inline PMSV given limited ability to wean from the ventilator. Daughter at bedside. Pt alert but repeatedly reported he was tired and often closed his eyes and did not fully engage with SLP or daughter without cueing and encouragement. Pt on CPAP/PS during trial, RT suctioned pt just prior to slow cuff deflation. Immediate drop in VTe observed indicating adequate upper airway patency. PEEP at 5, vent would not allow a change in PEEP. Pt did report too much air, burning the back of his throat, breathing too fast. SLP encouraged pt to prolong exhalation to control breath. Pt able to return demonstrate and slow RR briefly, but weakness limited pts efforts and bility to control breathing. Pt with  aphonic speech initially. Cues to throat clear and cough to clear secretions resulting in productive coughing and oral expectoration of small bloody mucus chunks.  Pt then able to briefly phonate at word level x4 with verbal cues from SLP to phonate on exhalation during counting. Pt spontaneously uttered two short phrases with improved breath support and soft phonation achieved. Vital signs remained stable. Pt did not attempt any functional communication of wants/needs/questions despite cues from daughter and SLP. Will continue efforts. ?SLP Visit Diagnosis: Aphonia (R49.1)  ?  ?SLP Assessment ? Patient needs continued Speech Lanaguage Pathology Services  ?  ?Recommendations for follow up therapy are one component of a multi-disciplinary discharge planning process, led by the attending physician.  Recommendations may be updated based on patient status, additional functional criteria and insurance authorization. ? ?Follow Up Recommendations ? Long-term institutional care without follow-up therapy ? ?  ?Assistance Recommended at Discharge    ?Functional Status Assessment    ?Frequency and Duration    ?  ?  ? ?PMSV Trial PMSV was placed for: 10 minutes ?Able to redirect subglottic air through upper airway: Yes ?Able to Attain Phonation: Yes ?Voice Quality: Low vocal intensity;Breathy;Hoarse ?Able to Expectorate Secretions: Yes ?Level of Secretion Expectoration with PMSV: Oral ?Breath Support for Phonation: Moderately decreased ?Intelligibility: Intelligibility reduced ?Word: 75-100% accurate ?Phrase: 75-100% accurate ?Sentence: Not tested ?Conversation: Not tested ?Respirations During Trial: 17 ?SpO2 During Trial: 96 % ?Pulse During Trial: 78 ?Behavior: Lethargic ?  ?Tracheostomy Tube ?    ?  ?Vent Dependency ? Vent Dependent: Yes ?Vent Mode: (S) PRVC ?Set Rate: 18 bmp ?PEEP: 5 cmH20 ?Pressure Support: (S) 8 cmH20 ?FiO2 (%): 40 % ?  Vt Set: 500 mL  ?  ?Cuff Deflation Trial Tolerated Cuff Deflation: Yes ?Length of Time  for Cuff Deflation Trial: 1 mintue  ?Harlon Ditty, MA CCC-SLP  ?Acute Rehabilitation Services ?Secure Chat Preferred ?Office [phone] ?   ? ? ?  ?Cosette Prindle, Riley Nearing ?10/14/2021, 12:46 PM ? ?

## 2021-10-14 NOTE — TOC Progression Note (Signed)
Transition of Care (TOC) - Initial/Assessment Note  ? ? ?Patient Details  ?Name: Troy Sampson ?MRN: [id number] ?Date of Birth: 01-24-1956 ? ?Transition of Care (TOC) CM/SW Contact:    ?Catalina Pizza Stela Iwasaki, LCSWA ?Phone Number: ?10/14/2021, 5:13 PM ? ?Clinical Narrative:                 ?Patient's insurance continues to be a barrier to placement.   ? ?TOC will continue to follow and wait for family's GOC decision.   ? ?Expected Discharge Plan: Long Term Nursing Home ?  ? ? ?Patient Goals and CMS Choice ?  ?  ?  ? ?Expected Discharge Plan and Services ?Expected Discharge Plan: Long Term Nursing Home ?  ?  ?  ?  ?                ?  ?  ?  ?  ?  ?  ?  ?  ?  ?  ? ?Prior Living Arrangements/Services ?  ?  ?  ?       ?  ?  ?  ?  ? ?Activities of Daily Living ?  ?  ? ?Permission Sought/Granted ?  ?  ?   ?   ?   ?   ? ?Emotional Assessment ?  ?  ?  ?  ?  ?  ? ?Admission diagnosis:  Acute respiratory failure with hypoxia (HCC) [J96.01] ?Community acquired pneumonia of left lower lobe of lung [J18.9] ?Sepsis, due to unspecified organism, unspecified whether acute organ dysfunction present (HCC) [A41.9] ?Acute respiratory failure (HCC) [J96.00] ?Patient Active Problem List  ? Diagnosis Date Noted  ? Status post tracheostomy (HCC)   ? Protein-calorie malnutrition, severe 10/01/2021  ? Pressure injury of skin 09/30/2021  ? Pneumonia, pneumococcal (HCC) 09/30/2021  ? Pneumococcal bacteremia 09/30/2021  ? Acute metabolic encephalopathy 09/30/2021  ? Alteration in electrolyte and fluid balance 09/30/2021  ? Hepatitis C 09/30/2021  ? Liver mass 09/30/2021  ? Anemia 09/30/2021  ? NSTEMI (non-ST elevated myocardial infarction) (HCC) 09/30/2021  ? Thrombocytopenia (HCC) 09/30/2021  ? Acute respiratory failure (HCC) 09/22/2021  ? Protein calorie malnutrition (HCC) 09/22/2021  ? Acute respiratory failure with hypoxia (HCC)   ? Sepsis (HCC)   ? Community acquired pneumonia of left lower lobe of lung 09/21/2021  ? ?PCP:  Pcp, No ?Pharmacy:    ?[id number] - [place], [place] - [address] ?[address] ?[place] [address] ?Phone: [phone] Fax: [phone] ? ? ? ? ?Social Determinants of Health (SDOH) Interventions ?  ? ?Readmission Risk Interventions ?No flowsheet data found. ? ? ?

## 2021-10-14 NOTE — Progress Notes (Signed)
RT and Speech were at bedside for in-line passy-muir valve (PSV) trail. RT deflated tracheal cuff to 0 during trial. Pt tolerated well with SVS. Pt expressed he was tired. So RT and speech ended trail. RT inflated tracheal cuff and placed pt back on full support for rest. RT will continue to monitor pt. ?

## 2021-10-14 NOTE — Progress Notes (Signed)
? ?NAME:  Troy Sampson, MRN:  [id number], DOB:  Aug 14, 1955, LOS: 22 ?ADMISSION DATE:  09/21/2021, CONSULTATION DATE:  09/22/2021 ?REFERRING MD:  [facility], CHIEF COMPLAINT:  Shortness of Breath  ? ?History of Present Illness:  ?66 y/o male with a PMHx of duodenal ulcer, IDA, nephrolithiasis and a current smoker who presented from [facility] with c/o SOB x 1 week. History obtained by his wife due to patient arrived obtunded, markedly hypoxic oxygen sats in the low 80s. Patient placed on BiPAP and given Ativan. He was fairly tachypnis with RR in 40s as well as tachycardia and was ultimately intubated for acute hypoxic respiratory failure and increased work of breathing. Noted to have a fever of 101.8. CXR show a LLL pneumonia. PCCM consulted for ICU admission.  ? 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 ?Significant Hospital Events: ?Including procedures, antibiotic start and stop dates in addition to other pertinent events   ?2/22: Admitted overnight to ICU. Blood cultures 3/4 positive for strep pneumo. Strep pneumo antigen positive, TTE with EF 50 to 55% no wall motion abnormalities ?2/26: New onset fevers. Antibiotics broadened to Cefepime and Vancomycin. Propofol switched to Precedex.  ?2/27: Bradycardia noted on Precedex with increased tachypnea.  ?2/28, antibiotics changed to penicillin G per infectious disease recommendation ?3/1: Vasoactive drip requirements improving, received 1 unit of blood for hemoglobin of 6.1 ?3/2 follow-up hemoglobin 6.8, received second unit of blood, has also received 3 units of platelets since admission.  Off norepinephrine  ?3/3 EGD> grade D esophagitis with bleeding ?3/4 tracheostomy ?3/7 ongoing tachypnea limiting SBT. Unable to SBT at all this morning. Overnight episode of bradycardia when being turned requiring atropine ? ?Interim History / Subjective:  ?Patient appears more alert today. He states that he  feels warm, otherwise denies any issues.  ? ?Objective   ?Blood pressure 126/72, pulse 88, temperature 99.2 ?F (37.3 ?C), temperature source Oral, resp. rate 20, height 5\' 7"  (1.702 m), weight 52.5 kg, SpO2 97 %. ?   ?Vent Mode: PRVC ?FiO2 (%):  [40 %] 40 % ?Set Rate:  [18 bmp] 18 bmp ?Vt Set:  [500 mL] 500 mL ?PEEP:  [5 cmH20] 5 cmH20 ?Plateau Pressure:  [16 cmH20-23 cmH20] 19 cmH20  ? ?Intake/Output Summary (Last 24 hours) at 10/14/2021 0740 ?Last data filed at 10/14/2021 0400 ?Gross per 24 hour  ?Intake 2554.66 ml  ?Output 1450 ml  ?Net 1104.66 ml  ? ? ?Filed Weights  ? 10/12/21 0500 10/13/21 0500 10/14/21 0500  ?Weight: 50.4 kg 51.8 kg 52.5 kg  ? ?Physical exam: ?General: ill-appearing male, trach dependence, cachetic, frail ?HEENT: [place]/AT, eyes anicteric. Cortrak in place ?Neuro: More awake and alert today ?Pul: Rhonchi heard in bilateral lung bases ?Heart: regular rate and rhythm, no murmurs or gallops ?Abdomen: mild distention, bowel sound present.  Nontender.  Stool output is dark brown. ?Skin: No rash ?Extremity: +1 edema bilateral upper extremity. ? ?Resolved Hospital Problem list   ?Septic shock ?Lactic acidosis ?Acute urinary retention ?Thrombocytopenia ?Type 2 MI ?Urinary retention ? ?Assessment & Plan:  ?Acute hypoxic and hypercapnic respiratory failure s/p trach ?Probable COPD ?Continue lung protective ventilation ?Continue SBT trial.  ?VAP prevention place ?CPT scheduled ?Continue trach care per protocol ? ?Severe sepsis due to Strep pneumo CAP + bacteremia-finished 14 dpenicillin G ?New fever and leukocytosis with recurrence/persistence of pneumonia + aspiration PNA ?Blood culture grew [place], likely contamination. No endocarditis on TTE. ?Afebrile overnight. WBC trend  down slightly.  ?Continue Unasyn per ID for aspiration PNA, end date 10/15/21 ?Lower suspicion for C. difficile. ? ?Acute on chronic blood loss anemia ?Upper GI bleeding from esophagitis, grade D ?GERD ?GI does not have any additional  recommendation.  No aggressive interventions.  Continue supportive care. ?Hgb stable today.  ?Hold heparin for DVT proph until hemoglobin stable ?Hold aspirin ?Continue PPI 40 mg BID. His GERD symptoms are not well controlled. Will add 20 mg Pepcid daily.  ? ?Bradycardia-resolved ?Hypotension ?Blood pressure remains in good range ?-Continue holding metoprolol and amlodipine ?-Continue suction.  Keep PEEP low ? ?Severe protein calorie malnutrition, cachexia ?Hypernatremia-resolved ?Continue TF through cortrak ?Continue free water flushes. Decrease to 200 cc Q6H ? ?HLDX ?cont statin ?  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 PSA minimally elevated so low suspicion for that being primary. AFP negative. Discussed pursuing IR biopsy in the future with family but given critical illness, patient is not currently stable enough. Suspect this is chronic given last IVDA occurred over 30 years ago. Viral load elevated at 27, 200,000.  ?LFTs are elevated ?Holding Tylenol ? ?Tobacco dependence ?Continue nicotine patch ?Smoking cessation counseling provided ? ?Goal of care ?Patient was made DNR yesterday. Palliative team will continue goal of care conversation today ? ?Best Practice (right click and "Reselect all SmartList Selections" daily)  ? ?Diet/type: tubefeeds ?DVT prophylaxis: SCD ?GI prophylaxis: PPI ?Lines: N/A ?Foley:  N/A ?Code Status:  DNR ?Last date of multidisciplinary goals of care discussion: 3/16 ?Critical care time: ?  ? ?Doran Stabler, DO ? ?

## 2021-10-14 NOTE — Progress Notes (Signed)
Physical Therapy Treatment ?Patient Details ?Name: Troy Sampson ?MRN: [id number] ?DOB: Oct 29, 1955 ?Today's Date: 10/14/2021 ? ? ?History of Present Illness Pt is a 66 y/o male who presented to the ED 2/22 with SOB. After arrival, pt became obtunded due to severe hypoxia and subsequently intubated emergently. Pt admitted with dx of acute hypoxic/hypercapnic respiratory failure 2nd to community acquired pneumonia.  Further testing revealed Hep C, suspected COPD, and potential underlying malignancy. Pt underwent tracheostomy 3/4. PMHx: duodenal ulcer, IDA, nephrolithiasis, ETOH ? ?  ?PT Comments  ? ? Pt reluctantly agreeable to transferring to recliner. Pt continues to have increased weakness, limiting ability to assist in movement especially with attempting to stand. Pt ultimately requires totalAx2 for coming to seated EoB and pivot to recliner Pt noted to have increased secretions today asking to be suctioned before transfer and after sitting in chair and attempting to participate in LE exercise.  Pending progress d/c plan remains appropriate. PT will continue to follow acutely. ?  ?Recommendations for follow up therapy are one component of a multi-disciplinary discharge planning process, led by the attending physician.  Recommendations may be updated based on patient status, additional functional criteria and insurance authorization. ? ?Follow Up Recommendations ? Acute inpatient rehab (3hours/day) (pending progress) ?  ?  ?Assistance Recommended at Discharge Frequent or constant Supervision/Assistance  ?Patient can return home with the following Two people to help with walking and/or transfers;Two people to help with bathing/dressing/bathroom;Assistance with cooking/housework;Assistance with feeding;Direct supervision/assist for medications management;Direct supervision/assist for financial management;Assist for transportation;Help with stairs or ramp for entrance;Other (comment) (hoyer lift) ?  ?Equipment  Recommendations ? Hospital bed;BSC/3in1;Wheelchair (measurements PT);Wheelchair cushion (measurements PT) (hoyer lift)  ?  ?Recommendations for Other Services Rehab consult ? ? ?  ?Precautions / Restrictions Precautions ?Precautions: Fall;Other (comment) ?Precaution Comments: vent via trach, NG tube, flexiseal ?Restrictions ?Weight Bearing Restrictions: No  ?  ? ?Mobility ? Bed Mobility ?Overal bed mobility: Needs Assistance ?Bed Mobility: Supine to Sit ?  ?  ?Supine to sit: Total assist, +2 for physical assistance ?  ?  ?General bed mobility comments: pt able to activate all 4 exteremities  however he still requires total A for to come to sitting EoB ?  ? ?Transfers ?Overall transfer level: Needs assistance ?Equipment used: 2 person hand held assist ?Transfers: Bed to chair/wheelchair/BSC ?  ?  ?  ?Squat pivot transfers: Total assist, +2 physical assistance, +2 safety/equipment ?  ?  ?General transfer comment: pt continues to lack strength to participate in stand pivot transfer to recliner, and remains total Ax2 for scooting along bed and pivot over to drop arm recliner ?  ? ?Ambulation/Gait ?  ?  ?  ?  ?  ?  ?  ?General Gait Details: unable ? ? ?  ? ? ?  ?Balance Overall balance assessment: Needs assistance ?Sitting-balance support: Bilateral upper extremity supported, Feet supported ?Sitting balance-Leahy Scale: Poor ?Sitting balance - Comments: has more core activation but still requires outside support to maintain balance (overall Min A to Mod A with fatigue) ?  ?  ?  ?  ?  ?  ?  ?  ?  ?  ?  ?  ?  ?  ?  ?  ? ?  ?Cognition Arousal/Alertness: Awake/alert ?Behavior During Therapy: Flat affect ?Overall Cognitive Status: Impaired/Different from baseline ?Area of Impairment: Attention, Following commands, Safety/judgement, Awareness, Problem solving ?  ?  ?  ?  ?  ?  ?  ?  ?  ?Current Attention Level:  Sustained ?  ?Following Commands: Follows one step commands with increased time ?Safety/Judgement: Decreased  awareness of safety, Decreased awareness of deficits ?Awareness: Intellectual ?Problem Solving: Slow processing, Decreased initiation, Difficulty sequencing, Requires verbal cues, Requires tactile cues ?General Comments: flat affect, follows one step commands relatively well (may also be HOH). slower processing and delayed motor planning at times ?  ?  ? ?  ?Exercises General Exercises - Lower Extremity ?Hip Flexion/Marching: AROM, Both, 5 reps, Seated (limited amplitude due to decreased strength) ? ?  ?General Comments General comments (skin integrity, edema, etc.): pt requesting suction prior to movement, pt with increased blood tinged secretions, reports feeling better after suction, however requires suction again after transfer to recliner. ?  ?  ? ?Pertinent Vitals/Pain Pain Assessment ?Pain Assessment: Faces ?Faces Pain Scale: Hurts little more ?Pain Location: generalized with movement, throat with secretions ?Pain Descriptors / Indicators: Grimacing, Discomfort ?Pain Intervention(s): Limited activity within patient's tolerance, Monitored during session, Repositioned  ? ? ? ?PT Goals (current goals can now be found in the care plan section) Acute Rehab PT Goals ?PT Goal Formulation: Patient unable to participate in goal setting ?Time For Goal Achievement: 10/17/21 ?Potential to Achieve Goals: Poor ?Progress towards PT goals: Not progressing toward goals - comment (very fatigued today, agreeable to transfer to chair as he knows its important) ? ?  ?Frequency ? ? ? Min 3X/week ? ? ? ?  ?PT Plan Discharge plan needs to be updated  ? ? ?   ?AM-PAC PT "6 Clicks" Mobility   ?Outcome Measure ? Help needed turning from your back to your side while in a flat bed without using bedrails?: Total ?Help needed moving from lying on your back to sitting on the side of a flat bed without using bedrails?: Total ?Help needed moving to and from a bed to a chair (including a wheelchair)?: Total ?Help needed standing up from a  chair using your arms (e.g., wheelchair or bedside chair)?: Total ?Help needed to walk in hospital room?: Total ?Help needed climbing 3-5 steps with a railing? : Total ?6 Click Score: 6 ? ?  ?End of Session Equipment Utilized During Treatment: Oxygen (vent via trach) ?Activity Tolerance: Patient limited by lethargy;Patient limited by fatigue ?Patient left: with call bell/phone within reach;with chair alarm set;in bed;with bed alarm set;with nursing/sitter in room ?Nurse Communication: Mobility status ?PT Visit Diagnosis: Other abnormalities of gait and mobility (R26.89);Muscle weakness (generalized) (M62.81) ?  ? ? ?Time: 2409-7353 ?PT Time Calculation (min) (ACUTE ONLY): 29 min ? ?Charges:  $Therapeutic Activity: 23-37 mins          ?          ? ?Meilah Delrosario B. Beverely Risen PT, DPT ?Acute Rehabilitation Services ?Pager [phone] ?Office [phone] ? ? ? ?Elon Alas Fleet ?10/14/2021, 2:05 PM ? ?

## 2021-10-15 ENCOUNTER — Inpatient Hospital Stay (HOSPITAL_COMMUNITY): Payer: Medicare Other

## 2021-10-15 LAB — COMPREHENSIVE METABOLIC PANEL
ALT: 125 U/L — ABNORMAL HIGH (ref 0–44)
AST: 159 U/L — ABNORMAL HIGH (ref 15–41)
Albumin: <1.5 g/dL — ABNORMAL LOW (ref 3.5–5.0)
Alkaline Phosphatase: 265 U/L — ABNORMAL HIGH (ref 38–126)
Anion gap: 8 (ref 5–15)
BUN: 18 mg/dL (ref 8–23)
CO2: 26 mmol/L (ref 22–32)
Calcium: 7.8 mg/dL — ABNORMAL LOW (ref 8.9–10.3)
Chloride: 106 mmol/L (ref 98–111)
Creatinine, Ser: 0.36 mg/dL — ABNORMAL LOW (ref 0.61–1.24)
GFR, Estimated: >60 mL/min (ref >60)
Glucose, Bld: 119 mg/dL — ABNORMAL HIGH (ref 70–99)
Potassium: 3.9 mmol/L (ref 3.5–5.1)
Sodium: 140 mmol/L (ref 135–145)
Total Bilirubin: 0.6 mg/dL (ref 0.3–1.2)
Total Protein: 6.3 g/dL — ABNORMAL LOW (ref 6.5–8.1)

## 2021-10-15 LAB — GLUCOSE, CAPILLARY
Glucose-Capillary: 120 mg/dL — ABNORMAL HIGH (ref 70–99)
Glucose-Capillary: 114 mg/dL — ABNORMAL HIGH (ref 70–99)
Glucose-Capillary: 110 mg/dL — ABNORMAL HIGH (ref 70–99)
Glucose-Capillary: 111 mg/dL — ABNORMAL HIGH (ref 70–99)
Glucose-Capillary: 132 mg/dL — ABNORMAL HIGH (ref 70–99)
Glucose-Capillary: 126 mg/dL — ABNORMAL HIGH (ref 70–99)

## 2021-10-15 LAB — CBC
HCT: 22.7 % — ABNORMAL LOW (ref 39.0–52.0)
Hemoglobin: 7.2 g/dL — ABNORMAL LOW (ref 13.0–17.0)
MCH: 30.9 pg (ref 26.0–34.0)
MCHC: 31.7 g/dL (ref 30.0–36.0)
MCV: 97.4 fL (ref 80.0–100.0)
Platelets: 341 10*3/uL (ref 150–400)
RBC: 2.33 MIL/uL — ABNORMAL LOW (ref 4.22–5.81)
RDW: 15.9 % — ABNORMAL HIGH (ref 11.5–15.5)
WBC: 29.9 10*3/uL — ABNORMAL HIGH (ref 4.0–10.5)
nRBC: 0.1 % (ref 0.0–0.2)

## 2021-10-15 NOTE — Progress Notes (Signed)
?                                                   ?Daily Progress Note  ? ?Patient Name: Troy Sampson       Date: 10/15/2021 ?DOB: 22-Dec-1955  Age: 66 y.o. MRN#: [id number] ?Attending Physician: Marshell Garfinkel, MD ?Primary Care Physician: Pcp, No ?Admit Date: 09/21/2021 ? ?Reason for Consultation/Follow-up: Establishing goals of care ? ?Subjective: ?Patient sitting up in bed with wife and daughter at bedside.  Indicates to me he feels the same as yesterday, no specific complaints. ? ?Length of Stay: 23 ? ?Current Medications: ?Scheduled Meds:  ? sodium chloride   Intravenous Once  ? atorvastatin  40 mg Per Tube Daily  ? chlorhexidine gluconate (MEDLINE KIT)  15 mL Mouth Rinse BID  ? Chlorhexidine Gluconate Cloth  6 each Topical Daily  ? famotidine  20 mg Per Tube Daily  ? feeding supplement (PROSource TF)  45 mL Per Tube BID  ? folic acid  1 mg Per Tube Daily  ? free water  200 mL Per Tube Q6H  ? guaiFENesin  10 mL Per Tube Q6H  ? insulin aspart  0-9 Units Subcutaneous Q4H  ? ipratropium-albuterol  3 mL Nebulization Q6H  ? mouth rinse  15 mL Mouth Rinse 10 times per day  ? multivitamin with minerals  1 tablet Per Tube Daily  ? nicotine  7 mg Transdermal Daily  ? pantoprazole (PROTONIX) IV  40 mg Intravenous Q12H  ? sodium chloride flush  10-40 mL Intracatheter Q12H  ? sodium chloride flush  10-40 mL Intracatheter Q12H  ? thiamine  100 mg Per Tube Daily  ? ? ?Continuous Infusions: ? sodium chloride 10 mL/hr at 10/01/21 1500  ? sodium chloride 5 mL/hr at 10/14/21 0011  ? ampicillin-sulbactam (UNASYN) IV Stopped (10/15/21 0757)  ? feeding supplement (VITAL 1.5 CAL) 1,000 mL (10/13/21 2236)  ? ? ?PRN Meds: ?Place/Maintain arterial line **AND** sodium chloride, sodium chloride, albuterol, atropine, docusate, fentaNYL (SUBLIMAZE) injection, hydrALAZINE, oxyCODONE, polyethylene glycol,  sodium chloride flush, sodium chloride flush, white petrolatum ? ?Physical Exam ?Constitutional:   ?   General: He is not in acute distress. ?   Appearance: He is ill-appearing.  ?   Comments: Frail, cachectic  ?Pulmonary:  ?   Comments: On full support of ventilator during my visit through trach ?Skin: ?   General: Skin is warm and dry.  ?Neurological:  ?   Comments: Difficult to assess  ?         ? ?Vital Signs: BP 132/77 (BP Location: Right Arm)   Pulse 95   Temp 98.6 ?F (37 ?C) (Oral)   Resp (!) 25   Ht 5' 7"  (1.702 m)   Wt 54.5 kg   SpO2 95%   BMI 18.82 kg/m?  ?SpO2: SpO2: 95 % ?O2 Device: O2 Device: Ventilator ?O2 Flow Rate: O2 Flow Rate (L/min): 10 L/min ? ?Intake/output summary:  ?Intake/Output Summary (Last 24 hours) at 10/15/2021 1320 ?Last data filed at 10/15/2021 1200 ?Gross per 24 hour  ?Intake 5140 ml  ?Output 1800 ml  ?Net 3340 ml  ? ? ?LBM: Last BM Date : 10/15/21 ?Baseline Weight: Weight: 46.3 kg ?Most recent weight: Weight: 54.5 kg ? ?    ? ?Flowsheet Rows   ? ?Flowsheet Row Most Recent Value  ?Intake Tab   ?  Referral Department Critical care  ?Unit at Time of Referral ICU  ?Palliative Care Primary Diagnosis Sepsis/Infectious Disease  ?Date Notified 10/12/21  ?Palliative Care Type New Palliative care  ?Reason for referral Clarify Goals of Care  ?Date of Admission 09/22/21  ?Date first seen by Palliative Care 10/13/21  ?# of days Palliative referral response time 1 Day(s)  ?# of days IP prior to Palliative referral 20  ?Clinical Assessment   ?Psychosocial & Spiritual Assessment   ?Palliative Care Outcomes   ? ?  ? ? ?Patient Active Problem List  ? Diagnosis Date Noted  ? Status post tracheostomy ([place])   ? Protein-calorie malnutrition, severe 10/01/2021  ? Pressure injury of skin 09/30/2021  ? Pneumonia, pneumococcal ([place]) 09/30/2021  ? Pneumococcal bacteremia 09/30/2021  ? Acute metabolic encephalopathy 66/44/0347  ? Alteration in electrolyte and fluid balance 09/30/2021  ? Hepatitis C  09/30/2021  ? Liver mass 09/30/2021  ? Anemia 09/30/2021  ? NSTEMI (non-ST elevated myocardial infarction) ([place]) 09/30/2021  ? Thrombocytopenia ([place]) 09/30/2021  ? Acute respiratory failure ([place]) 09/22/2021  ? Protein calorie malnutrition ([place]) 09/22/2021  ? Acute respiratory failure with hypoxia ([place])   ? Sepsis ([place])   ? Community acquired pneumonia of left lower lobe of lung 09/21/2021  ? ? ?Palliative Care Assessment & Plan  ? ?HPI: ?66 y.o. male  with past medical history of Duodenal ulcer, anemia, and current smoker admitted on 09/21/2021 with shortness of breath.  Ultimately required intubation due to respiratory failure.  Found to have left lower lobe pneumonia.  Also found to have positive blood cultures.  On March 3 he had an EGD that revealed esophagitis-ongoing anemia.  Per GI no other interventions to offer.  On March 4 patient had tracheostomy placed.  Patient with episodes of bradycardia throughout hospitalization.  Patient also found to have 3 solitary liver masses suspicious for malignancy.  PMT consulted to discuss goals of care. ? ?Assessment: ?Family conference today to include patient, wife, and daughter.  We again reviewed the situation.  We discussed his prognosis.  We discussed the concern that patient will remain ventilator dependent.  We discussed receiving care at a long-term care facility that accepts ventilators.  We discussed the alternative of focusing on comfort and quality of life.  I asked patient if he understood the situation and he nods and says yes.  I asked him if he wants to continue on the ventilator and he does not respond.  His family prompts him for response and he mouths to them that he needs more time to think about his options.  Family request ongoing support from critical care team as well as palliative care to aid in goals of care discussions.  Daughter does ask about where the patient could receive long-term acute care.  We discussed this is uncertain and likely  depends on insurance.  We discussed that there are long-term acute care hospitals in [place] however again it depends on patient's insurance. ? ?Recommendations/Plan: ?Ongoing conversations with patient and family to determine goals of care ?Family seems to indicate that patient would not want long-term ventilator however they are requesting time for further conversations, patient indicates he needs more time to consider ?Maintain no CPR ?Briefly introduced comfort focused care ? ?Code Status: ?Limited code - no CPR ? ?Care plan was discussed with patient and daughter and wife, critical care team ? ?Thank you for allowing the Palliative Medicine Team to assist in the care of this patient. ? ?*Please note that this is  a verbal dictation therefore any spelling or grammatical errors are due to the "[employer]" system interpretation. ? ?Juel Burrow, DNP, AGNP-C ?Palliative Medicine Team ?Team Phone # [phone]  ?Pager [phone] ? ?

## 2021-10-15 NOTE — Progress Notes (Signed)
Occupational Therapy Treatment ?Patient Details ?Name: Troy Sampson ?MRN: [id number] ?DOB: 01-10-1956 ?Today's Date: 10/15/2021 ? ? ?History of present illness Pt is a 66 y/o male who presented to the ED 2/22 with SOB. After arrival, pt became obtunded due to severe hypoxia and subsequently intubated emergently. Pt admitted with dx of acute hypoxic/hypercapnic respiratory failure 2nd to community acquired pneumonia.  Further testing revealed Hep C, suspected COPD, and potential underlying malignancy. Pt underwent tracheostomy 3/4. PMHx: duodenal ulcer, IDA, nephrolithiasis, ETOH ?  ?OT comments ? Pt requiring encouragement to participate with therapy today, agreeable for EOB only. Pt able to assist some to get EOB but continues to require extensive +2 assist for bed mobility completion. Once EOB, pt able to maintain sitting balance with Min guard to Min A, improved from previous OT session. Attempted to engage pt in basic grooming tasks sitting EOB though pt with decreased engagement and continued flat affect. Pt noted with increased edema in BUE. Reinforced AAROM exercises, elevation of BUE to combat edema. Pt's wife at bedside expressed understanding. Due to continued poor endurance and minimal consistent progress towards goals, [facility] and reducing frequency to 2x/wk. If pt begins to show improved tolerance and progress, will update DC recs accordingly. ? ?VSS on 40%FiO2 PRVC, PEEP 5 (though noted with tachypnea sitting EOB)  ? ?Recommendations for follow up therapy are one component of a multi-disciplinary discharge planning process, led by the attending physician.  Recommendations may be updated based on patient status, additional functional criteria and insurance authorization. ?   ?Follow Up Recommendations ? Long-term institutional care without follow-up therapy  ?  ?Assistance Recommended at Discharge Frequent or constant Supervision/Assistance  ?Patient can return home with the  following ? Two people to help with walking and/or transfers;Two people to help with bathing/dressing/bathroom;Assistance with cooking/housework;Assistance with feeding;Direct supervision/assist for medications management;Direct supervision/assist for financial management;Assist for transportation;Help with stairs or ramp for entrance ?  ?Equipment Recommendations ? Hospital bed  ?  ?Recommendations for Other Services   ? ?  ?Precautions / Restrictions Precautions ?Precautions: Fall;Other (comment) ?Precaution Comments: vent via trach, NG tube, flexiseal ?Restrictions ?Weight Bearing Restrictions: No  ? ? ?  ? ?Mobility Bed Mobility ?Overal bed mobility: Needs Assistance ?Bed Mobility: Supine to Sit, Sit to Supine ?  ?  ?Supine to sit: Max assist, +2 for physical assistance, +2 for safety/equipment, HOB elevated ?Sit to supine: Total assist, +2 for physical assistance, +2 for safety/equipment ?  ?General bed mobility comments: Able to assist in lifting trunk, minimal/weak LE muscle activation to bring LEs to EOB. Total A x2  to return to supine ?  ? ?Transfers ?Overall transfer level: Needs assistance ?Equipment used: 2 person hand held assist ?  ?Sit to Stand: Total assist, From elevated surface, +2 physical assistance ?  ?  ?  ?  ?  ?General transfer comment: brief partial sit to stand [facility] to returning to supine. pt declined full standing attempts or getting up into recliner ?  ?  ?Balance Overall balance assessment: Needs assistance ?Sitting-balance support: Bilateral upper extremity supported, Feet supported ?Sitting balance-Leahy Scale: Poor ?Sitting balance - Comments: has more core activation but still requires outside support to maintain balance especially when fatigued but overall min guard to Min A ?Postural control: Posterior lean, Right lateral lean ?  ?  ?  ?  ?  ?  ?  ?  ?  ?  ?  ?  ?  ?  ?   ? ?  ADL either performed or assessed with clinical judgement  ? ?ADL Overall ADL's  : Needs assistance/impaired ?Eating/Feeding: NPO ?  ?Grooming: Sitting;Wash/dry face;Total assistance ?Grooming Details (indicate cue type and reason): attempted to engage pt to reach for washcloth but pt shaking head no. HOH to reach to face and minimally wipe eyes ?  ?  ?  ?  ?  ?  ?  ?  ?  ?  ?  ?  ?  ?  ?  ?General ADL Comments: Improved sitting balance though pt remains limited by quick fatigue. appeared with flat affect, depressed mood benefitting from encouragement to participate ?  ? ?Extremity/Trunk Assessment Upper Extremity Assessment ?Upper Extremity Assessment: RUE deficits/detail;LUE deficits/detail ?RUE Deficits / Details: edema in hand, not as pronounced as L UE. able to bend elbow to reach across stomach, significant weakness ?RUE Coordination: decreased fine motor;decreased gross motor ?LUE Deficits / Details: significant edema in hand/digits and around elbow. AAROM to reach across to bedrail but unable to sustain grasp ?LUE Coordination: decreased fine motor;decreased gross motor ?  ?Lower Extremity Assessment ?Lower Extremity Assessment: Defer to PT evaluation ?  ?  ?  ? ?Vision   ?Vision Assessment?: No apparent visual deficits ?  ?Perception   ?  ?Praxis   ?  ? ?Cognition Arousal/Alertness: Awake/alert ?Behavior During Therapy: Flat affect ?Overall Cognitive Status: Impaired/Different from baseline ?Area of Impairment: Attention, Following commands, Safety/judgement, Awareness, Problem solving ?  ?  ?  ?  ?  ?  ?  ?  ?  ?Current Attention Level: Sustained ?  ?Following Commands: Follows one step commands inconsistently, Follows one step commands with increased time ?Safety/Judgement: Decreased awareness of safety, Decreased awareness of deficits ?Awareness: Intellectual ?Problem Solving: Slow processing, Decreased initiation, Difficulty sequencing, Requires verbal cues, Requires tactile cues ?General Comments: flat affect, follows one step commands variably (may also be HOH vs motivation).  slower processing and delayed motor planning at times ?  ?  ?   ?Exercises Exercises: Other exercises ?Other Exercises ?Other Exercises: composite flexion/extension, elbow extension/flexion, shoulder flexion/extension; 2-3 reps each side, spouse educated on exercise ? ?  ?Shoulder Instructions   ? ? ?  ?General Comments increasing RR with EOB activities though VSS on 40% FiO2 PRVC  ? ? ?Pertinent Vitals/ Pain       Pain Assessment ?Pain Assessment: Faces ?Faces Pain Scale: Hurts little more ?Pain Location: nods "yes" to pain but unable to specify location ?Pain Descriptors / Indicators: Grimacing ?Pain Intervention(s): Monitored during session, Limited activity within patient's tolerance ? ?Home Living   ?  ?  ?  ?  ?  ?  ?  ?  ?  ?  ?  ?  ?  ?  ?  ?  ?  ?  ? ?  ?Prior Functioning/Environment    ?  ?  ?  ?   ? ?Frequency ? Min 2X/week  ? ? ? ? ?  ?Progress Toward Goals ? ?OT Goals(current goals can now be found in the care plan section) ? Progress towards OT goals: OT to reassess next treatment ? ?Acute Rehab OT Goals ?Patient Stated Goal: none stated but not agreeable to get OOB today ?OT Goal Formulation: With patient/family ?Time For Goal Achievement: 10/20/21 ?Potential to Achieve Goals: Good ?ADL Goals ?Pt Will Perform Grooming: with max assist;sitting;bed level ?Pt Will Perform Upper Body Bathing: with max assist;bed level;sitting ?Pt Will Perform Lower Body Bathing: sitting/lateral leans;with mod assist ?Pt/caregiver will Perform Home Exercise Program: Increased  ROM;Both right and left upper extremity;With minimal assist;With written HEP provided ?Additional ADL Goal #1: Pt will tolerate sitting EOB five minutes with mod A for balance.  ?Plan Discharge plan needs to be updated;Frequency needs to be updated   ? ?Co-evaluation ? ? ?   ?  ?  ?  ?  ? ?  ?AM-PAC OT "6 Clicks" Daily Activity     ?Outcome Measure ? ? Help from another person eating meals?: Total ?Help from another person taking care of personal  grooming?: Total ?Help from another person toileting, which includes using toliet, bedpan, or urinal?: Total ?Help from another person bathing (including washing, rinsing, drying)?: Total ?Help from another person

## 2021-10-15 NOTE — Progress Notes (Signed)
? ?NAME:  Troy Sampson, MRN:  [id number], DOB:  11-05-1955, LOS: 23 ?ADMISSION DATE:  09/21/2021, CONSULTATION DATE:  09/22/2021 ?REFERRING MD:  [facility], CHIEF COMPLAINT:  Shortness of Breath  ? ?History of Present Illness:  ?66 y/o male with a PMHx of duodenal ulcer, IDA, nephrolithiasis and a current smoker who presented from [facility] with c/o SOB x 1 week. History obtained by his wife due to patient arrived obtunded, markedly hypoxic oxygen sats in the low 80s. Patient placed on BiPAP and given Ativan. He was fairly tachypnis with RR in 40s as well as tachycardia and was ultimately intubated for acute hypoxic respiratory failure and increased work of breathing. Noted to have a fever of 101.8. CXR show a LLL pneumonia. PCCM consulted for ICU admission.  ? 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 ?Significant Hospital Events: ?Including procedures, antibiotic start and stop dates in addition to other pertinent events   ?2/22: Admitted overnight to ICU. Blood cultures 3/4 positive for strep pneumo. Strep pneumo antigen positive, TTE with EF 50 to 55% no wall motion abnormalities ?2/26: New onset fevers. Antibiotics broadened to Cefepime and Vancomycin. Propofol switched to Precedex.  ?2/27: Bradycardia noted on Precedex with increased tachypnea.  ?2/28, antibiotics changed to penicillin G per infectious disease recommendation ?3/1: Vasoactive drip requirements improving, received 1 unit of blood for hemoglobin of 6.1 ?3/2 follow-up hemoglobin 6.8, received second unit of blood, has also received 3 units of platelets since admission.  Off norepinephrine  ?3/3 EGD> grade D esophagitis with bleeding ?3/4 tracheostomy ?3/7 ongoing tachypnea limiting SBT. Unable to SBT at all this morning. Overnight episode of bradycardia when being turned requiring atropine ? ?Interim History / Subjective:  ?Patient is alert and awake today.  Does not appear  in any acute distress.  Denies any issue. ? ?Objective   ?Blood pressure 128/76, pulse 87, temperature 99.7 ?F (37.6 ?C), temperature source Oral, resp. rate (!) 21, height 5\' 7"  (1.702 m), weight 54.5 kg, SpO2 97 %. ?   ?Vent Mode: PRVC ?FiO2 (%):  [40 %] 40 % ?Set Rate:  [18 bmp] 18 bmp ?Vt Set:  [500 mL] 500 mL ?PEEP:  [5 cmH20] 5 cmH20 ?Pressure Support:  [8 cmH20-10 cmH20] 8 cmH20 ?Plateau Pressure:  [17 cmH20-25 cmH20] 25 cmH20  ? ?Intake/Output Summary (Last 24 hours) at 10/15/2021 0736 ?Last data filed at 10/15/2021 0600 ?Gross per 24 hour  ?Intake 4665 ml  ?Output 1800 ml  ?Net 2865 ml  ? ? ?Filed Weights  ? 10/13/21 0500 10/14/21 0500 10/15/21 0500  ?Weight: 51.8 kg 52.5 kg 54.5 kg  ? ?Physical exam: ?General: ill-appearing male, trach dependence, cachetic, frail ?HEENT: [place]/AT, eyes anicteric. Cortrak in place ?Neuro: Alert and awake.  Nodding to answer questions ?Pul: Rhonchi heard in bilateral lung bases ?Heart: regular rate and rhythm, no murmurs or gallops ?Skin: No rash ?Extremity: +1 edema bilateral upper extremity. ? ?Resolved Hospital Problem list   ?Septic shock ?Lactic acidosis ?Acute urinary retention ?Thrombocytopenia ?Type 2 MI ?Urinary retention ? 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  ?VAP prevention place ?CPT scheduled ?Continue trach care per protocol ? ?Strep pneumo CAP + bacteremia-finished 14 dpenicillin G ?Aspiration PNA ?Blood culture grew Kocuria, likely contamination. No endocarditis on TTE. ?Afebrile overnight. WBC trend down trending upward ?Continue Unasyn per ID for aspiration PNA, end date  10/15/21 ?Lower suspicion for C. difficile. ?-Repeat chest x-ray today ?-If fever curve and WBC continues to trend up, will obtain abdominal imaging ? ?Acute on chronic blood loss anemia ?Upper GI bleeding from esophagitis, grade D ?GERD ?GI does not have any  additional recommendation.  No aggressive interventions.  Continue supportive care. ?Hgb trending down slowly.  Could be secondary to phlebotomy ?Hold heparin for DVT proph until hemoglobin stable ?Hold aspirin ?Continue PPI and Pepcid for his GERD ? ?Hypotension ?Blood pressure remains in good range ?-Continue holding metoprolol and amlodipine ?-Continue suction.  Keep PEEP low ? ?Severe protein calorie malnutrition, cachexia ?Continue TF through cortrak ?Continue free water flushes.  ? ?HLDX ?cont statin ?  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 PSA minimally elevated so low suspicion for that being primary. AFP negative. Discussed pursuing IR biopsy in the future with family but given critical illness, patient is not currently stable enough. Suspect this is chronic given last IVDA occurred over 30 years ago. Viral load elevated at 27, 200,000.  ?LFTs are elevated ?Holding Tylenol ? ?Tobacco dependence ?Continue nicotine patch ?Smoking cessation counseling provided ? ?Goal of care ?I talked to patient this morning and explain to him the overall prognosis.  Explained to him that he may not be able to get off the ventilator for the rest of his life.  Patient shook his head when asking if he would like to be ventilator dependent.  I introduced the idea palliative and comfort care, in which we focus more on symptomatic treatment and preserved quality of life as well as dignity for the patient.  I will update family and continue goal of care conversation this morning with palliative team. ? ?Best Practice (right click and "Reselect all SmartList Selections" daily)  ? ?Diet/type: tubefeeds ?DVT prophylaxis: SCD ?GI prophylaxis: PPI ?Lines: N/A ?Foley:  N/A ?Code Status:  Partial. No CPR ?Last date of multidisciplinary goals of care  discussion: 3/17 ?Critical care time: ?  ? ?Doran Stabler, DO ? ?

## 2021-10-15 NOTE — Progress Notes (Signed)
?    [facility] ? ? ?Reason for visit: Follow up on pneumonia ? ?Interval History: no acute change ? ?Physical Exam: ?Constitutional:  ?Vitals:  ? 10/15/21 1505 10/15/21 1515  ?BP:    ?Pulse: 89   ?Resp: (!) 23   ?Temp:  98.7 ?F (37.1 ?C)  ?SpO2: 98%   ? patient appears in NAD ? ?Impression: aspiration pneumonia, respiratory failure. Not much change overall.  Leukocytosis up a bit but no new significant concerns.  Plan to stop ampicillin/sulbactam today. ? ?Plan: ?1.  Stop antibiotics today ?We are available again if needed. I will sign off.  ?

## 2021-10-15 NOTE — Progress Notes (Signed)
Patient seen today by trach team for consult. No education needed at this time.  All the necessary equipment is at bedside.  Will continue to follow for progression.  The patient is still on full ventilatory support. ?

## 2021-10-16 LAB — GLUCOSE, CAPILLARY
Glucose-Capillary: 110 mg/dL — ABNORMAL HIGH (ref 70–99)
Glucose-Capillary: 111 mg/dL — ABNORMAL HIGH (ref 70–99)
Glucose-Capillary: 121 mg/dL — ABNORMAL HIGH (ref 70–99)
Glucose-Capillary: 133 mg/dL — ABNORMAL HIGH (ref 70–99)
Glucose-Capillary: 139 mg/dL — ABNORMAL HIGH (ref 70–99)
Glucose-Capillary: 146 mg/dL — ABNORMAL HIGH (ref 70–99)

## 2021-10-16 LAB — CBC
HCT: 21.1 % — ABNORMAL LOW (ref 39.0–52.0)
Hemoglobin: 6.3 g/dL — CL (ref 13.0–17.0)
MCH: 30.1 pg (ref 26.0–34.0)
MCHC: 29.9 g/dL — ABNORMAL LOW (ref 30.0–36.0)
MCV: 101 fL — ABNORMAL HIGH (ref 80.0–100.0)
Platelets: 294 10*3/uL (ref 150–400)
RBC: 2.09 MIL/uL — ABNORMAL LOW (ref 4.22–5.81)
RDW: 16.1 % — ABNORMAL HIGH (ref 11.5–15.5)
WBC: 24 10*3/uL — ABNORMAL HIGH (ref 4.0–10.5)
nRBC: 0.1 % (ref 0.0–0.2)

## 2021-10-16 LAB — HEMOGLOBIN AND HEMATOCRIT, BLOOD
HCT: 24 % — ABNORMAL LOW (ref 39.0–52.0)
Hemoglobin: 7.7 g/dL — ABNORMAL LOW (ref 13.0–17.0)

## 2021-10-16 LAB — PREPARE RBC (CROSSMATCH)

## 2021-10-16 MED ORDER — SODIUM CHLORIDE 0.9% IV SOLUTION: Freq: Once | INTRAVENOUS | Status: AC

## 2021-10-16 NOTE — Progress Notes (Signed)
eLink Physician-Brief Progress Note ?Patient Name: Troy Sampson ?DOB: 03-13-1956 ?MRN: [id number] ? ? ?Date of Service ? 10/16/2021  ?HPI/Events of Note ? Hgb 6.3 w/o obvious active bleeding  ?eICU Interventions ? Transfuse 1 unit PRBC  ? ? ? ?Intervention Category ?Intermediate Interventions: Bleeding - evaluation and treatment with blood products ? ?Henry Russel, P ?10/16/2021, 1:15 AM ?

## 2021-10-16 NOTE — Progress Notes (Signed)
? ?NAME:  Troy Sampson, MRN:  [id number], DOB:  [birth year], LOS: 24 ?ADMISSION DATE:  09/21/2021, CONSULTATION DATE:  09/22/2021 ?REFERRING MD:  [facility], CHIEF COMPLAINT:  Shortness of Breath  ? ?History of Present Illness:  ?66 y/o male with a PMHx of duodenal ulcer, IDA, nephrolithiasis and a current smoker who presented from [facility] with c/o SOB x 1 week. History obtained by his wife due to patient arrived obtunded, markedly hypoxic oxygen sats in the low 80s. Patient placed on BiPAP and given Ativan. He was fairly tachypnis with RR in 40s as well as tachycardia and was ultimately intubated for acute hypoxic respiratory failure and increased work of breathing. Noted to have a fever of 101.8. CXR show a LLL pneumonia. PCCM consulted for ICU admission.  ? 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 ?Significant Hospital Events: ?Including procedures, antibiotic start and stop dates in addition to other pertinent events   ?2/22: Admitted overnight to ICU. Blood cultures 3/4 positive for strep pneumo. Strep pneumo antigen positive, TTE with EF 50 to 55% no wall motion abnormalities ?2/26: New onset fevers. Antibiotics broadened to Cefepime and Vancomycin. Propofol switched to Precedex.  ?2/27: Bradycardia noted on Precedex with increased tachypnea.  ?2/28, antibiotics changed to penicillin G per infectious disease recommendation ?3/1: Vasoactive drip requirements improving, received 1 unit of blood for hemoglobin of 6.1 ?3/2 follow-up hemoglobin 6.8, received second unit of blood, has also received 3 units of platelets since admission.  Off norepinephrine  ?3/3 EGD> grade D esophagitis with bleeding ?3/4 tracheostomy ?3/7 ongoing tachypnea limiting SBT. Unable to SBT at all this morning. Overnight episode of bradycardia when being turned requiring atropine ?3/15 palliative consulted, made limited code with no CPR ?3/17 ongoing palliative care  discussions ? ?Interim History / Subjective:  ? ?Remains on the ventilator.  Unable to wean.  Transfuse 1 unit PRBC for low hemoglobin with no active bleed ? ?Objective   ?Blood pressure 129/71, pulse 92, temperature (!) 97.2 ?F (36.2 ?C), temperature source Axillary, resp. rate (!) 24, height 5\' 7"  (1.702 m), weight 55.6 kg, SpO2 96 %. ?   ?Vent Mode: CPAP;PSV ?FiO2 (%):  [40 %-50 %] 40 % ?Set Rate:  [18 bmp] 18 bmp ?Vt Set:  [500 mL] 500 mL ?PEEP:  [5 cmH20] 5 cmH20 ?Pressure Support:  [10 cmH20] 10 cmH20 ?Plateau Pressure:  [20 cmH20-21 cmH20] 20 cmH20  ? ?Intake/Output Summary (Last 24 hours) at 10/16/2021 0804 ?Last data filed at 10/16/2021 0600 ?Gross per 24 hour  ?Intake 2084.09 ml  ?Output 1100 ml  ?Net 984.09 ml  ? ?Filed Weights  ? 10/14/21 0500 10/15/21 0500 10/16/21 0500  ?Weight: 52.5 kg 54.5 kg 55.6 kg  ? ?Physical exam: ?Gen:      No acute distress, cachectic, frail ?HEENT:  EOMI, sclera anicteric ?Neck:     No masses; no thyromegaly, trach ?Lungs:    Clear to auscultation bilaterally; normal respiratory effort ?CV:         Regular rate and rhythm; no murmurs ?Abd:      + bowel sounds; soft, non-tender; no palpable masses, no distension ?Ext:    2+ edema ?Skin:      Warm and dry; no rash ?Neuro: Awake, responds ? ?Labs/imaging reviewed ?Significant for glucose 119, creatinine 0.36 ?LFTs remain elevated ?WBC 24, hemoglobin 6.3, platelets 294 ?No new imaging ? ?Resolved Hospital Problem list   ?Septic shock ?Lactic acidosis ?Acute urinary  retention ?Thrombocytopenia ?Type 2 MI ?Urinary retention ? 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  ?VAP prevention place ?CPT scheduled ?Continue trach care per protocol ? ?Strep pneumo CAP + bacteremia-finished 14 dpenicillin G ?Aspiration PNA ?Blood culture grew Kocuria, likely contamination. No endocarditis on TTE. ?Afebrile overnight. WBC  trend down trending upward ?Finished Unasyn per ID for aspiration PNA, end date 10/15/21 ?Lower suspicion for C. difficile. ? ?Acute on chronic blood loss anemia ?Upper GI bleeding from esophagitis, grade D ?GERD ?GI does not have any additional recommendation.  No aggressive interventions.  Continue supportive care. ?Hgb trending down slowly.  Could be secondary to phlebotomy ?Hold heparin for DVT proph until hemoglobin stable ?Hold aspirin ?Continue PPI and Pepcid for his GERD ? ?Hypotension ?Blood pressure remains in good range ?-Continue holding metoprolol and amlodipine ?-Continue suction.  Keep PEEP low ? ?Severe protein calorie malnutrition, cachexia ?Continue TF through cortrak ?Continue free water flushes.  ? ?HLDX ?cont statin ?  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 PSA minimally elevated so low suspicion for that being primary. AFP negative. Discussed pursuing IR biopsy in the future with family but given critical illness, patient is not currently stable enough. Suspect this is chronic given last IVDA occurred over 30 years ago. Viral load elevated at 27, 200,000.  ?LFTs are elevated ?Holding Tylenol ? ?Tobacco dependence ?Continue nicotine patch ?Smoking cessation counseling provided ? ?Goal of care ?Ongoing discussions with family.  Patient does not want to be ventilator dependent for the rest of his life. Family is still coming to terms and need the weekend to process.  Appreciate help from palliative care. ? ?Best Practice (right click and "Reselect all SmartList Selections" daily)  ? ?Diet/type: tubefeeds ?DVT prophylaxis: SCD ?GI prophylaxis: PPI ?Lines: N/A ?Foley:  N/A ?Code Status:  Partial. No CPR ?Last date of multidisciplinary goals of care discussion: 3/17 ? ?Critical care time:  ? ?The patient is critically ill  with multiple organ system failure and requires high complexity decision making for assessment and support, frequent evaluation and titration of therapies, advanced monitoring, review of radiographic studies and interpretation of complex data.  ? ?Critical Care Time devoted to patient care services, exclusive of separately billable procedures, described in this note is 45 minutes.  ? ?Chilton Greathouse MD ?[facility] ?See Amion for pager ? ?If no response to pager , please call [phone] until 7pm ?After 7:00 pm call Elink  [phone] ?10/16/2021, 9:14 AM  ? ?

## 2021-10-16 NOTE — Progress Notes (Signed)
Palliative: ? ?Ongoing goals of care discussions with patient and family. Per discussions with patient and family 3/17 between myself and Dr. Alfonse Spruce, patient and family request more time to consider options and make a decision. As discussed yesterday, will allow family weekend to consider options and palliative team will follow up early next week. I will be back on service Tuesday 3/21 and will plan to reengage with family at that date. If support of palliative team is needed prior to Tuesday, please call [phone]. ? ?Troy Burrow, DNP, AGNP-C ?Palliative Medicine Team ?Team Phone # [phone]  ?Pager # [phone] ? ?NO CHARGE ?

## 2021-10-17 LAB — PHOSPHORUS: Phosphorus: 2.4 mg/dL — ABNORMAL LOW (ref 2.5–4.6)

## 2021-10-17 LAB — BASIC METABOLIC PANEL
Anion gap: 5 (ref 5–15)
BUN: 19 mg/dL (ref 8–23)
CO2: 27 mmol/L (ref 22–32)
Calcium: 7.9 mg/dL — ABNORMAL LOW (ref 8.9–10.3)
Chloride: 105 mmol/L (ref 98–111)
Creatinine, Ser: 0.35 mg/dL — ABNORMAL LOW (ref 0.61–1.24)
GFR, Estimated: >60 mL/min (ref >60)
Glucose, Bld: 121 mg/dL — ABNORMAL HIGH (ref 70–99)
Potassium: 4.6 mmol/L (ref 3.5–5.1)
Sodium: 137 mmol/L (ref 135–145)

## 2021-10-17 LAB — CBC
HCT: 22.3 % — ABNORMAL LOW (ref 39.0–52.0)
Hemoglobin: 7.1 g/dL — ABNORMAL LOW (ref 13.0–17.0)
MCH: 30.7 pg (ref 26.0–34.0)
MCHC: 31.8 g/dL (ref 30.0–36.0)
MCV: 96.5 fL (ref 80.0–100.0)
Platelets: 278 10*3/uL (ref 150–400)
RBC: 2.31 MIL/uL — ABNORMAL LOW (ref 4.22–5.81)
RDW: 17.9 % — ABNORMAL HIGH (ref 11.5–15.5)
WBC: 26.3 10*3/uL — ABNORMAL HIGH (ref 4.0–10.5)
nRBC: 0.2 % (ref 0.0–0.2)

## 2021-10-17 LAB — MAGNESIUM: Magnesium: 1.8 mg/dL (ref 1.7–2.4)

## 2021-10-17 LAB — GLUCOSE, CAPILLARY
Glucose-Capillary: 107 mg/dL — ABNORMAL HIGH (ref 70–99)
Glucose-Capillary: 121 mg/dL — ABNORMAL HIGH (ref 70–99)
Glucose-Capillary: 120 mg/dL — ABNORMAL HIGH (ref 70–99)

## 2021-10-17 MED ORDER — FREE WATER
100.0000 mL | Freq: Four times a day (QID) | Status: DC
  Administered 2021-10-17 – 2021-10-24 (×24): 100 mL

## 2021-10-17 MED ORDER — POTASSIUM & SODIUM PHOSPHATES 280-160-250 MG PO PACK
2.0000 | PACK | ORAL | Status: AC
  Administered 2021-10-17 (×2): 2
  Filled 2021-10-17 (×2): qty 2

## 2021-10-17 MED ORDER — MAGNESIUM SULFATE 2 GM/50ML IV SOLN
2.0000 g | Freq: Once | INTRAVENOUS | Status: AC
  Administered 2021-10-17: 2 g via INTRAVENOUS
  Filled 2021-10-17: qty 50

## 2021-10-17 NOTE — Progress Notes (Signed)
? ?NAME:  Troy Sampson, MRN:  [id number], DOB:  Jul 05, 1956, LOS: 25 ?ADMISSION DATE:  09/21/2021, CONSULTATION DATE:  09/22/2021 ?REFERRING MD:  [facility], CHIEF COMPLAINT:  Shortness of Breath  ? ?History of Present Illness:  ?66 y/o male with a PMHx of duodenal ulcer, IDA, nephrolithiasis and a current smoker who presented from [facility] with c/o SOB x 1 week. History obtained by his wife due to patient arrived obtunded, markedly hypoxic oxygen sats in the low 80s. Patient placed on BiPAP and given Ativan. He was fairly tachypnis with RR in 40s as well as tachycardia and was ultimately intubated for acute hypoxic respiratory failure and increased work of breathing. Noted to have a fever of 101.8. CXR show a LLL pneumonia. PCCM consulted for ICU admission.  ? 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 ?Significant Hospital Events: ?Including procedures, antibiotic start and stop dates in addition to other pertinent events   ?2/22: Admitted overnight to ICU. Blood cultures 3/4 positive for strep pneumo. Strep pneumo antigen positive, TTE with EF 50 to 55% no wall motion abnormalities ?2/26: New onset fevers. Antibiotics broadened to Cefepime and Vancomycin. Propofol switched to Precedex.  ?2/27: Bradycardia noted on Precedex with increased tachypnea.  ?2/28, antibiotics changed to penicillin G per infectious disease recommendation ?3/1: Vasoactive drip requirements improving, received 1 unit of blood for hemoglobin of 6.1 ?3/2 follow-up hemoglobin 6.8, received second unit of blood, has also received 3 units of platelets since admission.  Off norepinephrine  ?3/3 EGD> grade D esophagitis with bleeding ?3/4 tracheostomy ?3/7 ongoing tachypnea limiting SBT. Unable to SBT at all this morning. Overnight episode of bradycardia when being turned requiring atropine ?3/15 palliative consulted, made limited code with no CPR ?3/17 ongoing palliative care  discussions ?3/18 Transfused 1 unit PRBC for low hemoglobin with no active bleed ? ?Interim History / Subjective:  ? ?No acute issues.  Remains vent dependent. ? ?Objective   ?Blood pressure (!) 143/76, pulse (!) 105, temperature 98.3 ?F (36.8 ?C), temperature source Oral, resp. rate (!) 22, height 5\' 7"  (1.702 m), weight 55.6 kg, SpO2 98 %. ?   ?Vent Mode: CPAP;PSV ?FiO2 (%):  [40 %] 40 % ?Set Rate:  [18 bmp] 18 bmp ?Vt Set:  [500 mL] 500 mL ?PEEP:  [5 cmH20] 5 cmH20 ?Pressure Support:  [10 cmH20] 10 cmH20 ?Plateau Pressure:  [19 cmH20-23 cmH20] 19 cmH20  ? ?Intake/Output Summary (Last 24 hours) at 10/17/2021 0940 ?Last data filed at 10/17/2021 0600 ?Gross per 24 hour  ?Intake 1500 ml  ?Output 1225 ml  ?Net 275 ml  ? ?Filed Weights  ? 10/14/21 0500 10/15/21 0500 10/16/21 0500  ?Weight: 52.5 kg 54.5 kg 55.6 kg  ? ?Physical exam: ?Gen:      No acute distress ?HEENT:  EOMI, sclera anicteric ?Neck:     No masses; no thyromegaly, trach ?Lungs:    Clear to auscultation bilaterally; normal respiratory effort ?CV:         Regular rate and rhythm; no murmurs ?Abd:      + bowel sounds; soft, non-tender; no palpable masses, no distension ?Ext:    2-3+ edema over upper extremity ?Skin:      Warm and dry; no rash ?Neuro: Sedated ? ?Labs/imaging reviewed.  Significant for ?WBC count 26.3, hemoglobin 7.1 ?No new imaging ? ?Resolved Hospital Problem list   ?Septic shock ?Lactic acidosis ?Acute urinary retention ?Thrombocytopenia ?Type 2 MI ?Urinary retention ? ?  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  ?VAP prevention place ?Routine trach care and bronchial hygiene ? ?Strep pneumo CAP + bacteremia-finished 14 dpenicillin G ?Aspiration PNA ?Blood culture grew [place], likely contamination. No endocarditis on TTE. ?Afebrile overnight.  WBC count remains elevated ?Finished Unasyn per ID for aspiration PNA on  10/15/21 ?Lower suspicion for C. difficile. ?Monitor off antibiotics for now ? ?Acute on chronic blood loss anemia ?Upper GI bleeding from esophagitis, grade D ?GERD ?GI does not have any additional recommendation.  No aggressive interventions.  Continue supportive care. ?Hgb trending down slowly.  Could be secondary to phlebotomy ?Hold heparin for DVT proph until hemoglobin stable ?Hold aspirin ?Continue PPI and Pepcid for his GERD ?Transfuse as needed ? ?Hypotension ?Blood pressure remains in good range ?Continue holding metoprolol and amlodipine ? ?Severe protein calorie malnutrition, cachexia ?Continue TF through cortrak ?Continue free water flushes.  ? ?HLDX ?cont statin ?  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 PSA minimally elevated so low suspicion for that being primary. AFP negative. Discussed pursuing IR biopsy in the future with family but given critical illness, patient is not currently stable enough. Suspect this is chronic given last IVDA occurred over 30 years ago. Viral load elevated at 27, 200,000.  ?LFTs are elevated ?Holding Tylenol ? ?Tobacco dependence ?Continue nicotine patch ?Smoking cessation counseling provided ? ?Goal of care ?Ongoing discussions with family.  Patient does not want to be ventilator dependent for the rest of his life. Family is still coming to terms and need the weekend to process.  Appreciate help from palliative care. ? ?Best Practice (right click and "Reselect all SmartList Selections" daily)  ? ?Diet/type: tubefeeds ?DVT prophylaxis: SCD ?GI prophylaxis: PPI ?Lines: N/A ?Foley:  N/A ?Code Status:  Partial. No CPR ?Last date of multidisciplinary goals of care discussion: 3/17 ? ?Critical care time:  ? ?The patient is critically ill with multiple organ system failure and requires high complexity  decision making for assessment and support, frequent evaluation and titration of therapies, advanced monitoring, review of radiographic studies and interpretation of complex data.  ? ?Critical Care Time devoted to patient care services, exclusive of separately billable procedures, described in this note is 35 minutes.  ? ?Chilton Greathouse MD ?[facility] ?See Amion for pager ? ?If no response to pager , please call [phone] until 7pm ?After 7:00 pm call Elink  [phone] ?10/17/2021, 9:47 AM  ? ?

## 2021-10-17 NOTE — Progress Notes (Signed)
Placed pt back in Full support due to increased HR and RR ?

## 2021-10-18 ENCOUNTER — Inpatient Hospital Stay (HOSPITAL_COMMUNITY): Payer: Medicare Other

## 2021-10-18 ENCOUNTER — Inpatient Hospital Stay: Payer: Self-pay

## 2021-10-18 LAB — SYNOVIAL CELL COUNT + DIFF, W/ CRYSTALS
Eosinophils-Synovial: 0 % (ref 0–1)
Lymphocytes-Synovial Fld: 1 % (ref 0–20)
Monocyte-Macrophage-Synovial Fluid: 5 % — ABNORMAL LOW (ref 50–90)
Neutrophil, Synovial: 94 % — ABNORMAL HIGH (ref 0–25)
WBC, Synovial: 22100 /mm3 — ABNORMAL HIGH (ref 0–200)

## 2021-10-18 LAB — BASIC METABOLIC PANEL
Anion gap: 9 (ref 5–15)
Anion gap: 5 (ref 5–15)
BUN: 26 mg/dL — ABNORMAL HIGH (ref 8–23)
BUN: 22 mg/dL (ref 8–23)
CO2: 23 mmol/L (ref 22–32)
CO2: 32 mmol/L (ref 22–32)
Calcium: 7.9 mg/dL — ABNORMAL LOW (ref 8.9–10.3)
Calcium: 8.2 mg/dL — ABNORMAL LOW (ref 8.9–10.3)
Chloride: 108 mmol/L (ref 98–111)
Chloride: 106 mmol/L (ref 98–111)
Creatinine, Ser: 0.44 mg/dL — ABNORMAL LOW (ref 0.61–1.24)
Creatinine, Ser: 0.36 mg/dL — ABNORMAL LOW (ref 0.61–1.24)
GFR, Estimated: >60 mL/min (ref >60)
GFR, Estimated: >60 mL/min (ref >60)
Glucose, Bld: 131 mg/dL — ABNORMAL HIGH (ref 70–99)
Glucose, Bld: 134 mg/dL — ABNORMAL HIGH (ref 70–99)
Potassium: 5.9 mmol/L — ABNORMAL HIGH (ref 3.5–5.1)
Potassium: 4.7 mmol/L (ref 3.5–5.1)
Sodium: 140 mmol/L (ref 135–145)
Sodium: 143 mmol/L (ref 135–145)

## 2021-10-18 LAB — GLUCOSE, CAPILLARY
Glucose-Capillary: 112 mg/dL — ABNORMAL HIGH (ref 70–99)
Glucose-Capillary: 125 mg/dL — ABNORMAL HIGH (ref 70–99)
Glucose-Capillary: 130 mg/dL — ABNORMAL HIGH (ref 70–99)
Glucose-Capillary: 132 mg/dL — ABNORMAL HIGH (ref 70–99)
Glucose-Capillary: 133 mg/dL — ABNORMAL HIGH (ref 70–99)

## 2021-10-18 LAB — CBC
HCT: 19.8 % — ABNORMAL LOW (ref 39.0–52.0)
Hemoglobin: 6.2 g/dL — CL (ref 13.0–17.0)
MCH: 30.5 pg (ref 26.0–34.0)
MCHC: 31.3 g/dL (ref 30.0–36.0)
MCV: 97.5 fL (ref 80.0–100.0)
Platelets: 296 10*3/uL (ref 150–400)
RBC: 2.03 MIL/uL — ABNORMAL LOW (ref 4.22–5.81)
RDW: 16.8 % — ABNORMAL HIGH (ref 11.5–15.5)
WBC: 28.4 10*3/uL — ABNORMAL HIGH (ref 4.0–10.5)
nRBC: 0.1 % (ref 0.0–0.2)

## 2021-10-18 LAB — HEMOGLOBIN AND HEMATOCRIT, BLOOD
HCT: 19.4 % — ABNORMAL LOW (ref 39.0–52.0)
Hemoglobin: 6.1 g/dL — CL (ref 13.0–17.0)

## 2021-10-18 LAB — PREPARE RBC (CROSSMATCH)

## 2021-10-18 LAB — MAGNESIUM: Magnesium: 2.1 mg/dL (ref 1.7–2.4)

## 2021-10-18 MED ORDER — SODIUM CHLORIDE 0.9% IV SOLUTION: Freq: Once | INTRAVENOUS | Status: AC

## 2021-10-18 MED ORDER — LIDOCAINE HCL 1 % IJ SOLN
10.0000 mL | Freq: Once | INTRAMUSCULAR | Status: DC
Start: 2021-10-18 — End: 2021-10-18

## 2021-10-18 MED ORDER — FUROSEMIDE 10 MG/ML IJ SOLN
40.0000 mg | Freq: Once | INTRAMUSCULAR | Status: AC
  Administered 2021-10-18: 40 mg via INTRAVENOUS
  Filled 2021-10-18: qty 4

## 2021-10-18 MED ORDER — FENTANYL CITRATE (PF) 100 MCG/2ML IJ SOLN
25.0000 ug | INTRAMUSCULAR | Status: DC | PRN
  Administered 2021-10-19 – 2021-10-25 (×18): 25 ug via INTRAVENOUS
  Filled 2021-10-18 (×18): qty 2

## 2021-10-18 MED ORDER — LIDOCAINE HCL (PF) 1 % IJ SOLN
10.0000 mL | Freq: Once | INTRAMUSCULAR | Status: AC
  Administered 2021-10-18: 1.5 mL via INTRADERMAL

## 2021-10-18 MED ORDER — METOPROLOL TARTRATE 12.5 MG HALF TABLET
12.5000 mg | ORAL_TABLET | Freq: Two times a day (BID) | ORAL | Status: DC
  Administered 2021-10-18 – 2021-10-23 (×12): 12.5 mg
  Filled 2021-10-18 (×12): qty 1

## 2021-10-18 MED ORDER — LIDOCAINE HCL (PF) 1 % IJ SOLN
INTRAMUSCULAR | Status: AC
  Filled 2021-10-18: qty 5

## 2021-10-18 NOTE — Progress Notes (Signed)
SLP Cancellation Note ? ?Patient Details ?Name: Marik Darland ?MRN: [id number] ?DOB: 14-Dec-1955 ? ? ?Cancelled treatment:       Reason Eval/Treat Not Completed: Patient not medically ready. Pt with a plan for brief trach collar trials this week with the goal of avoiding fatigue. Pt was able to minimally participate with inline PMSV on PS/CPAP last week. Did not have the strength or endurance to truly communicate. Will follow closely, if he starts to tolerate brief ATC trials with good arousal and attention for communication will discuss PMSV with team.  ? ?Herbie Baltimore, MA CCC-SLP  ?Acute Rehabilitation Services ?Secure Chat Preferred ?Office [phone] ? ?Yuritzy Zehring, Katherene Ponto ?10/18/2021, 10:34 AM ?

## 2021-10-18 NOTE — Progress Notes (Signed)
IVT consulted r/t 92M not having midline dressing change kits.  A kit was tubed to 92M, including stat lock.  Secretary attempted to call RN, but unavailable; Network engineer made aware kit is being tubed and asked for RN to be relayed the message. Will also send secure chat. ?

## 2021-10-18 NOTE — Progress Notes (Signed)
eLink Physician-Brief Progress Note ?Patient Name: Tron Brenden ?DOB: [birth year] ?MRN: [id number] ? ? ?Date of Service ? 10/18/2021  ?HPI/Events of Note ? Pt reports worsening R knee pain. Now with associated knee swelling and warmth.  ?Pt has been bedbound. No history of trauma.  ?Pt with history of arthritis, but knee had not been swollen in the past.   ?eICU Interventions ? - Etiology unclear. Differentials include crystal arthropathy, ?septic arthritis, vs. Systemic rheumatic disease ?- Increased fentanyl PRN to 53mcg for pain relief.  ?- May benefit from joint aspiration with synovial fluid microscopy/crystal analysis, cell count, Gram stain and culture.   ? ? ? ?  ? ?[place] ?10/18/2021, 6:35 AM ?

## 2021-10-18 NOTE — Progress Notes (Addendum)
Physical Therapy Treatment ?Patient Details ?Name: Troy Sampson ?MRN: [id number] ?DOB: 20-Jan-1956 ?Today's Date: 10/18/2021 ? ? ?History of Present Illness Pt is a 66 y/o male who presented to the ED 2/22 with SOB. After arrival, pt became obtunded due to severe hypoxia and subsequently intubated emergently. Pt admitted with dx of acute hypoxic/hypercapnic respiratory failure 2nd to community acquired pneumonia.  Further testing revealed Hep C, suspected COPD, and potential underlying malignancy. Pt underwent tracheostomy 3/4. PMHx: duodenal ulcer, IDA, nephrolithiasis, ETOH ? ?  ?PT Comments  ? ? Pt limited today by increased lethargy and pain in R knee and wrist with AAROM. Pt refuses to attempt to come to EoB today. Agreeable to bed exercises and obviously trying to stay awake and participate with great difficulty. Pt s/p irrigation of R knee which he reports feels better but pt grimaces with AAROM. Pt R wrist also noted to be red and warm and very painful to movement. RN notified. Goals reviewed and downgraded. Pt status continues to deteriorate and PT changing discharge recommendation to Long Term Care. PT will continue to follow acutely.  ?   ?Recommendations for follow up therapy are one component of a multi-disciplinary discharge planning process, led by the attending physician.  Recommendations may be updated based on patient status, additional functional criteria and insurance authorization. ? ?Follow Up Recommendations ? Long-term institutional care without follow-up therapy ?  ?  ?Assistance Recommended at Discharge Frequent or constant Supervision/Assistance  ?Patient can return home with the following Two people to help with walking and/or transfers;Two people to help with bathing/dressing/bathroom;Assistance with cooking/housework;Assistance with feeding;Direct supervision/assist for medications management;Direct supervision/assist for financial management;Assist for transportation;Help with stairs or  ramp for entrance;Other (comment) (hoyer lift) ?  ?Equipment Recommendations ? Hospital bed;BSC/3in1;Wheelchair (measurements PT);Wheelchair cushion (measurements PT) (hoyer lift)  ?  ?   ?Precautions / Restrictions Precautions ?Precautions: Fall;Other (comment) ?Precaution Comments: vent via trach, NG tube, flexiseal ?Restrictions ?Weight Bearing Restrictions: No  ?  ? ?Mobility ? Bed Mobility ?  ?  ?  ?  ?  ?  ?  ?General bed mobility comments: pt request to not move to EoB today ?  ? ?  ?   ?Cognition Arousal/Alertness: Awake/alert, Lethargic ?Behavior During Therapy: Flat affect ?Overall Cognitive Status: Impaired/Different from baseline ?Area of Impairment: Attention, Following commands, Safety/judgement, Awareness, Problem solving ?  ?  ?  ?  ?  ?  ?  ?  ?  ?Current Attention Level: Sustained ?  ?Following Commands: Follows one step commands with increased time ?Safety/Judgement: Decreased awareness of safety, Decreased awareness of deficits ?Awareness: Intellectual ?Problem Solving: Slow processing, Decreased initiation, Difficulty sequencing, Requires verbal cues, Requires tactile cues ?General Comments: lethargic, able to follow commands for initial rep of AAROM and then falls asleep, evident that he is trying to keep his eyes open and participate but unable ?  ?  ? ?  ?Exercises General Exercises - Upper Extremity ?Elbow Flexion: AAROM, Both, 10 reps ?Wrist Flexion: AAROM, Both, 5 reps, Supine (L wrist with warm and red, sensitive to ROM) ?Digit Composite Flexion: AAROM, Both, 5 reps, Supine ?Composite Extension: AROM, Both, 5 reps, Supine ?General Exercises - Lower Extremity ?Ankle Circles/Pumps: AAROM, Both, 5 reps ?Heel Slides: AAROM, Both, 5 reps, Supine (decreased ROM and pain in L knee with flexion) ? ?  ?General Comments  VSS on trach collar ? ?  ?  ? ?Pertinent Vitals/Pain Pain Assessment ?Pain Assessment: Faces ?Faces Pain Scale: Hurts whole lot ?Pain Location: with AAROM of  L wrist and knee ?Pain  Descriptors / Indicators: Grimacing, Discomfort, Moaning, Sharp ?Pain Intervention(s): Limited activity within patient's tolerance, Monitored during session, Repositioned  ? ? ? ?PT Goals (current goals can now be found in the care plan section) Acute Rehab PT Goals ?PT Goal Formulation: Patient unable to participate in goal setting ?Time For Goal Achievement: 10/17/21 ?Potential to Achieve Goals: Poor ?Progress towards PT goals: Not progressing toward goals - comment (limited by pain and lethargy) ? ?  ?Frequency ? ? ? Min 3X/week ? ? ? ?  ?PT Plan Discharge plan needs to be updated  ? ? ?   ?AM-PAC PT "6 Clicks" Mobility   ?Outcome Measure ? Help needed turning from your back to your side while in a flat bed without using bedrails?: Total ?Help needed moving from lying on your back to sitting on the side of a flat bed without using bedrails?: Total ?Help needed moving to and from a bed to a chair (including a wheelchair)?: Total ?Help needed standing up from a chair using your arms (e.g., wheelchair or bedside chair)?: Total ?Help needed to walk in hospital room?: Total ?Help needed climbing 3-5 steps with a railing? : Total ?6 Click Score: 6 ? ?  ?End of Session Equipment Utilized During Treatment: Oxygen (vent via trach) ?Activity Tolerance: Patient limited by lethargy;Patient limited by fatigue ?Patient left: with call bell/phone within reach;with chair alarm set;in bed;with bed alarm set;with nursing/sitter in room ?Nurse Communication: Mobility status ?PT Visit Diagnosis: Other abnormalities of gait and mobility (R26.89);Muscle weakness (generalized) (M62.81) ?  ? ? ?Time: 1093-2355 ?PT Time Calculation (min) (ACUTE ONLY): 10 min ? ?Charges:  $Therapeutic Exercise: 8-22 mins          ?          ? ?Cavion Faiola B. Beverely Risen PT, DPT ?Acute Rehabilitation Services ?Pager [phone] ?Office [phone] ? ? ? ?Elon Alas Fleet ?10/18/2021, 3:47 PM ? ?

## 2021-10-18 NOTE — Progress Notes (Addendum)
? ?NAME:  Troy Sampson, MRN:  [id number], DOB:  09-18-1955, LOS: 26 ?ADMISSION DATE:  09/21/2021, CONSULTATION DATE:  09/22/2021 ?REFERRING MD:  [facility], CHIEF COMPLAINT:  Shortness of Breath  ? ?History of Present Illness:  ?66 y/o male with a PMHx of duodenal ulcer, IDA, nephrolithiasis and a current smoker who presented from [facility] with c/o SOB x 1 week. History obtained by his wife due to patient arrived obtunded, markedly hypoxic oxygen sats in the low 80s. Patient placed on BiPAP and given Ativan. He was fairly tachypnis with RR in 40s as well as tachycardia and was ultimately intubated for acute hypoxic respiratory failure and increased work of breathing. Noted to have a fever of 101.8. CXR show a LLL pneumonia. PCCM consulted for ICU admission.  ? 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 ?Significant Hospital Events: ?Including procedures, antibiotic start and stop dates in addition to other pertinent events   ?2/22: Admitted overnight to ICU. Blood cultures 3/4 positive for strep pneumo. Strep pneumo antigen positive, TTE with EF 50 to 55% no wall motion abnormalities ?2/26: New onset fevers. Antibiotics broadened to Cefepime and Vancomycin. Propofol switched to Precedex.  ?2/27: Bradycardia noted on Precedex with increased tachypnea.  ?2/28, antibiotics changed to penicillin G per infectious disease recommendation ?3/1: Vasoactive drip requirements improving, received 1 unit of blood for hemoglobin of 6.1 ?3/2 follow-up hemoglobin 6.8, received second unit of blood, has also received 3 units of platelets since admission.  Off norepinephrine  ?3/3 EGD> grade D esophagitis with bleeding ?3/4 tracheostomy ?3/7 ongoing tachypnea limiting SBT. Unable to SBT at all this morning. Overnight episode of bradycardia when being turned requiring atropine ?3/15 palliative consulted, made limited code with no CPR ?3/17 ongoing palliative care  discussions ?3/18 Transfused 1 unit PRBC for low hemoglobin with no active bleed ? ?Interim History / Subjective:  ? ?He is not in acute distress. ? ?Objective   ?Blood pressure (!) 145/78, pulse (!) 106, temperature 100.3 ?F (37.9 ?C), temperature source Oral, resp. rate 19, height 5\' 7"  (1.702 m), weight 54.5 kg, SpO2 97 %. ?   ?Vent Mode: PSV;CPAP ?FiO2 (%):  [40 %] (P) 40 % ?Set Rate:  [18 bmp] 18 bmp ?Vt Set:  [500 mL] 500 mL ?PEEP:  [5 cmH20] 5 cmH20 ?Pressure Support:  [10 cmH20] 10 cmH20 ?Plateau Pressure:  [18 cmH20] 18 cmH20  ? ?Intake/Output Summary (Last 24 hours) at 10/18/2021 1100 ?Last data filed at 10/18/2021 10/20/2021 ?Gross per 24 hour  ?Intake 1520 ml  ?Output 2260 ml  ?Net -740 ml  ? ? ?Filed Weights  ? 10/15/21 0500 10/16/21 0500 10/18/21 0331  ?Weight: 54.5 kg 55.6 kg 54.5 kg  ? ?Physical exam: ?Gen:      No acute distress, awake and alert ?HEENT:  EOMI, sclera anicteric ?Neck:     Trach in place ?Lungs:    normal respiratory effort ?CV:         Tachycardic, regular rhythm, no murmur ?Abd:      + bowel sounds; soft, non-tender; ?Ext:    2-3+ edema over upper extremity ?Skin:      Warm and dry; no rash ? ?Resolved Hospital Problem list   ?Septic shock ?Lactic acidosis ?Acute urinary retention ?Thrombocytopenia ?Type 2 MI ?Urinary retention ? 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  ?Continue lung protective ventilation ?VAP prevention place ?Routine trach care and bronchial hygiene ?Chest x-ray this morning showed similar bilateral infiltrates with L>R pleural effusion.  Will give 1 dose of Lasix 40 mg today ? ?Aspiration PNA ?Afebrile overnight.  WBC count remains elevated ?Finished Unasyn per ID for aspiration PNA on 10/15/21 ?Monitor off antibiotics for now ? ?Acute on chronic blood loss anemia ?Upper GI bleeding from esophagitis, grade  D ?GERD ?Transfuse 1 unit for hemoglobin of 6.1.  Suspect slow ongoing GI bleed. ?GI does not have any additional recommendation.  No aggressive interventions.  Continue supportive care. ?Hold heparin for DVT proph until hemoglobin stable ?Continue PPI and Pepcid for his GERD ? ?Right knee swelling ?High suspicion for crystal arthritis. Bedside arthrocentesis collected 5 cc of yellow fluid. Pending cell count, crystal and culture. If positive for gout, will proceed with intraarticular steroid injection ? ?Hypertension ?Tachycardia ?Patient has been tachycardic since 3/18.  Could be secondary to acute blood loss but suspect rebound tachycardia from holding Lopressor. ?-Will resume Lopressor 12.5 mg BID.  ?-Continue holding amlodipine ? ?Severe protein calorie malnutrition, cachexia ?Continue TF through cortrak ?Continue free water flushes.  ? ?HLDX ?cont statin ?  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 PSA minimally elevated so low suspicion for that being primary. AFP negative. Discussed pursuing IR biopsy in the future with family but given critical illness, patient is not currently stable enough. Suspect this is chronic given last IVDA occurred over 30 years ago. Viral load elevated at 27, 200,000.  ?LFTs are elevated ?Holding Tylenol ? ?Tobacco dependence ?Continue nicotine patch ?Smoking cessation counseling provided ? ? ?Best Practice (right click and "Reselect all SmartList Selections" daily)  ? ?Diet/type: tubefeeds ?DVT prophylaxis: SCD.  DVT prophylaxis until hemoglobin stable ?GI prophylaxis: PPI ?Lines: N/A ?Foley:  N/A ?Code Status:  Partial. No CPR ?Last date of multidisciplinary goals of care discussion: 3/17.  We will have a meeting with family tomorrow 3/21. ? ?Critical care time:  ? ?Doran Stabler, DO ? ?

## 2021-10-18 NOTE — Progress Notes (Signed)
Nutrition Follow-up ? ?DOCUMENTATION CODES:  ? ?Severe malnutrition in context of chronic illness ? ?INTERVENTION:  ? ?Continue tube feeds via Cortrak: ?- Increase Vital 1.5 to 60 ml/hr (1440 ml/day) ?- ProSource TF 45 ml BID ?- Free water flushes per CCM, currently 100 ml q 6 hours ?  ?Tube feeding regimen provides 2240 kcal, 119 grams of protein, and 1100 ml of H2O. ?  ?Total free water with flushes: 1500 ml ? ?- Add Nutrisource Fiber BID per tube to bulk stool ?  ?- Continue MVI with minerals daily per tube ? ?NUTRITION DIAGNOSIS:  ? ?Severe Malnutrition related to chronic illness as evidenced by severe muscle depletion, severe fat depletion. ? ?Ongoing, being addressed via TF ? ?GOAL:  ? ?Patient will meet greater than or equal to 90% of their needs ? ?Met via TF ? ?MONITOR:  ? ?Vent status, Labs, Weight trends, TF tolerance, Skin, I & O's ? ?REASON FOR ASSESSMENT:  ? ?Consult, Ventilator ?Enteral/tube feeding initiation and management ? ?ASSESSMENT:  ? ?Pt with active tobacco dependence and PMH of duodenal ulcer, IDA, and nephrolithiasis admitted with severe acute hypoxic and hypercapnic respiratory failure due to CAP. ? ?02/27 - Cortrak placed (tip gastric, near pylorus) ?03/02 - pt with 2 episodes of emesis ?03/03 - s/p EGD showing friable esophageal mucosa with active signs of bleeding from gastric fundus and esophagus ?03/04 - s/p tracheostomy ?03/05 - Cortrak clogged, NG tube placed ?03/07 - NG tube removed, Cortrak started working again (tip in third portion of duodenum) ?03/20 - brief trial of TC, s/p R knee aspiration ? ?CCM attempting short trach collar trials daily (max of 2 hours sessions BID). Pt with likely diagnosis of malignancy with liver lesions. Palliative Medicine Team continues to follow pt for [place] discussions. ? ?Pt on trach collar at time of RD visit, receiving blood. Tube feeds infusing as ordered. CCM suspects ongoing upper GI bleed. ? ?Current TF: Vital 1.5 @ 55 ml/hr, ProSource TF 45 m  BID, free water flushes of 100 ml q 6 hours ? ?Admit weight: 46.3 kg ?Current weight: 49.8 kg ? ?Pt with deep pitting edema to BUE. ? ?Medications reviewed and include: pepcid, folic acid, SSI q 4 hours, MVI with minerals, IV protonix, thiamine ? ?Vitamin/Mineral Profile: ?Vitamin B12: 707 (WNL) ?Folate B9: 5.4 (low) ?Ferritin: 674 (high) ? ?Labs reviewed: BUN 29, phosphorus 2.4 on 3/19, elevated LFTs, hemoglobin 5.1, WBC 28.1 ?CBG's: 110-146 x 24 hours ? ?UOP: 2725 ml x 24 hours ?I/O's: +24.3 L since admit ? ?Diet Order:   ?Diet Order   ? ?       ?  Diet NPO time specified  Diet effective midnight       ?  ? ?  ?  ? ?  ? ? ?EDUCATION NEEDS:  ? ?Not appropriate for education at this time ? ?Skin:  Skin Assessment: ?Skin Integrity Issues: ?Stage II: sacrum, R ear ? ?Last BM:  10/18/21 rectal tube ? ?Height:  ? ?Ht Readings from Last 1 Encounters:  ?09/22/21 _0  (1.702 m)  ? ? ?Weight:  ? ?Wt Readings from Last 1 Encounters:  ?10/19/21 49.8 kg  ? ? ?BMI:  Body mass index is 17.2 kg/m?. ? ?Estimated Nutritional Needs:  ? ?Kcal:  2000-2200 ? ?Protein:  90-110 grams ? ?Fluid:  >1.8 L ? ? ? ?Gustavus Bryant, MS, RD, LDN ?Inpatient Clinical Dietitian ?Please see AMiON for contact information. ? ?

## 2021-10-18 NOTE — Procedures (Signed)
PROCEDURE NOTE ? ?PROCEDURE: right knee aspiration ? ?PREOPERATIVE DIAGNOSIS: Right knee swelling  ? ?POSTOPERATIVE DIAGNOSIS: Right knee swelling  ? ?PROCEDURE: The patient was apprised of the risks and the benefits of the procedure and verbal consent was obtained. Time-out procedure was performed, with confirmation of the patient's name, date of birth, and correct identification of the right knee to be injected. The patient's knee was then marked at the appropriate site for injection placement. The knee was sterilely prepped with Chlorhexidine. A 1.5 cc solution of 1% lidocaine was drawn up into a 5 mL syringe. The patient was injected with a 23-gauge needle at the Superior Lateral aspect of his right knee. 5 cc of non-bloody, yellow cloudy fluid was collected. The fluid was sent for cell count, culture and crystal.  ? ?There were no complications. The patient tolerated the procedure well. There was minimal bleeding.  ? ?The procedure was supervised by attending physician, Dr. Lynetta Mare.  ?

## 2021-10-18 NOTE — Progress Notes (Signed)
Patient's hemoglobin came back 6.1 after 1 unit of PRBCs which is incorrect. This morning his H/H was 6.2 before the one unit. CCM aware and PICC order placed. Will re-check H/H after PICC line placed. Will continue to monitor. Mayline Dragon E, RN ?10/18/2021 ?2:55 PM ? ?

## 2021-10-18 NOTE — Progress Notes (Signed)
Posttransfusion hemoglobin of 6.1 from 6.2.  Per RN, it was difficult to obtain his blood due to edematous upper extremity with a concern of hemodilution.  Will place a PICC line and reobtaining H&H. ?

## 2021-10-19 DIAGNOSIS — M25561 Pain in right knee: Secondary | ICD-10-CM

## 2021-10-19 LAB — CBC
HCT: 16.1 % — ABNORMAL LOW (ref 39.0–52.0)
HCT: 15.7 % — ABNORMAL LOW (ref 39.0–52.0)
Hemoglobin: 5.1 g/dL — CL (ref 13.0–17.0)
Hemoglobin: 5 g/dL — CL (ref 13.0–17.0)
MCH: 30.5 pg (ref 26.0–34.0)
MCH: 30.7 pg (ref 26.0–34.0)
MCHC: 31.7 g/dL (ref 30.0–36.0)
MCHC: 31.8 g/dL (ref 30.0–36.0)
MCV: 96.4 fL (ref 80.0–100.0)
MCV: 96.3 fL (ref 80.0–100.0)
Platelets: 260 10*3/uL (ref 150–400)
Platelets: 260 10*3/uL (ref 150–400)
RBC: 1.67 MIL/uL — ABNORMAL LOW (ref 4.22–5.81)
RBC: 1.63 MIL/uL — ABNORMAL LOW (ref 4.22–5.81)
RDW: 16.2 % — ABNORMAL HIGH (ref 11.5–15.5)
RDW: 16.4 % — ABNORMAL HIGH (ref 11.5–15.5)
WBC: 28.1 10*3/uL — ABNORMAL HIGH (ref 4.0–10.5)
WBC: 28.8 10*3/uL — ABNORMAL HIGH (ref 4.0–10.5)
nRBC: 0 % (ref 0.0–0.2)
nRBC: 0.1 % (ref 0.0–0.2)

## 2021-10-19 LAB — COMPREHENSIVE METABOLIC PANEL
ALT: 63 U/L — ABNORMAL HIGH (ref 0–44)
AST: 72 U/L — ABNORMAL HIGH (ref 15–41)
Albumin: <1.5 g/dL — ABNORMAL LOW (ref 3.5–5.0)
Alkaline Phosphatase: 174 U/L — ABNORMAL HIGH (ref 38–126)
Anion gap: 5 (ref 5–15)
BUN: 29 mg/dL — ABNORMAL HIGH (ref 8–23)
CO2: 32 mmol/L (ref 22–32)
Calcium: 7.9 mg/dL — ABNORMAL LOW (ref 8.9–10.3)
Chloride: 106 mmol/L (ref 98–111)
Creatinine, Ser: 0.39 mg/dL — ABNORMAL LOW (ref 0.61–1.24)
GFR, Estimated: >60 mL/min (ref >60)
Glucose, Bld: 116 mg/dL — ABNORMAL HIGH (ref 70–99)
Potassium: 4.4 mmol/L (ref 3.5–5.1)
Sodium: 143 mmol/L (ref 135–145)
Total Bilirubin: 0.6 mg/dL (ref 0.3–1.2)
Total Protein: 5.9 g/dL — ABNORMAL LOW (ref 6.5–8.1)

## 2021-10-19 LAB — PREPARE RBC (CROSSMATCH)

## 2021-10-19 LAB — HEMOGLOBIN AND HEMATOCRIT, BLOOD
HCT: 22.8 % — ABNORMAL LOW (ref 39.0–52.0)
Hemoglobin: 7.3 g/dL — ABNORMAL LOW (ref 13.0–17.0)

## 2021-10-19 LAB — GLUCOSE, CAPILLARY
Glucose-Capillary: 110 mg/dL — ABNORMAL HIGH (ref 70–99)
Glucose-Capillary: 146 mg/dL — ABNORMAL HIGH (ref 70–99)
Glucose-Capillary: 129 mg/dL — ABNORMAL HIGH (ref 70–99)
Glucose-Capillary: 126 mg/dL — ABNORMAL HIGH (ref 70–99)

## 2021-10-19 MED ORDER — SODIUM CHLORIDE 0.9% IV SOLUTION: Freq: Once | INTRAVENOUS | Status: AC

## 2021-10-19 MED ORDER — LIDOCAINE HCL (PF) 1 % IJ SOLN
5.0000 mL | Freq: Once | INTRAMUSCULAR | Status: AC
  Administered 2021-10-19: 1 mL via INTRADERMAL
  Filled 2021-10-19: qty 5

## 2021-10-19 MED ORDER — VITAL 1.5 CAL PO LIQD
1000.0000 mL | ORAL | Status: DC
  Administered 2021-10-19 – 2021-10-29 (×10): 1000 mL
  Filled 2021-10-19 (×7): qty 1000

## 2021-10-19 MED ORDER — TRIAMCINOLONE ACETONIDE 40 MG/ML IJ SUSP
60.0000 mg | Freq: Once | INTRAMUSCULAR | Status: AC
  Administered 2021-10-19: 60 mg via INTRA_ARTICULAR
  Filled 2021-10-19: qty 1.5

## 2021-10-19 MED ORDER — NUTRISOURCE FIBER PO PACK
1.0000 | PACK | Freq: Two times a day (BID) | ORAL | Status: DC
  Administered 2021-10-19 – 2021-10-29 (×20): 1
  Filled 2021-10-19 (×23): qty 1

## 2021-10-19 MED ORDER — LIDOCAINE HCL 1 % IJ SOLN
5.0000 mL | Freq: Once | INTRAMUSCULAR | Status: DC
  Filled 2021-10-19: qty 5

## 2021-10-19 NOTE — TOC Progression Note (Signed)
Transition of Care (TOC) - Initial/Assessment Note  ? ? ?Patient Details  ?Name: Troy Sampson ?MRN: [id number] ?Date of Birth: 01-24-1956 ? ?Transition of Care (TOC) CM/SW Contact:    ?Catalina Pizza Armany Mano, LCSWA ?Phone Number: ?10/19/2021, 9:10 AM ? ?Clinical Narrative:                 ?Family meeting with Palliative again today to discuss GOC.   ? ?Barrier to placement= underinsured ? ?TOC will continue to follow.   ? ?Expected Discharge Plan: Long Term Nursing Home ?  ? ? ?Patient Goals and CMS Choice ?  ?  ?  ? ?Expected Discharge Plan and Services ?Expected Discharge Plan: Long Term Nursing Home ?  ?  ?  ?  ?                ?  ?  ?  ?  ?  ?  ?  ?  ?  ?  ? ?Prior Living Arrangements/Services ?  ?  ?  ?       ?  ?  ?  ?  ? ?Activities of Daily Living ?  ?  ? ?Permission Sought/Granted ?  ?  ?   ?   ?   ?   ? ?Emotional Assessment ?  ?  ?  ?  ?  ?  ? ?Admission diagnosis:  Acute respiratory failure with hypoxia (HCC) [J96.01] ?Community acquired pneumonia of left lower lobe of lung [J18.9] ?Sepsis, due to unspecified organism, unspecified whether acute organ dysfunction present (HCC) [A41.9] ?Acute respiratory failure (HCC) [J96.00] ?Patient Active Problem List  ? Diagnosis Date Noted  ? Status post tracheostomy (HCC)   ? Protein-calorie malnutrition, severe 10/01/2021  ? Pressure injury of skin 09/30/2021  ? Pneumonia, pneumococcal (HCC) 09/30/2021  ? Pneumococcal bacteremia 09/30/2021  ? Acute metabolic encephalopathy 09/30/2021  ? Alteration in electrolyte and fluid balance 09/30/2021  ? Hepatitis C 09/30/2021  ? Liver mass 09/30/2021  ? Anemia 09/30/2021  ? NSTEMI (non-ST elevated myocardial infarction) (HCC) 09/30/2021  ? Thrombocytopenia (HCC) 09/30/2021  ? Acute respiratory failure (HCC) 09/22/2021  ? Protein calorie malnutrition (HCC) 09/22/2021  ? Acute respiratory failure with hypoxia (HCC)   ? Sepsis (HCC)   ? Community acquired pneumonia of left lower lobe of lung 09/21/2021  ? ?PCP:  Pcp, No ?Pharmacy:    ?[id number] - [place], [place] - [address] ?[address] ?[place] [address] ?Phone: [phone] Fax: [phone] ? ? ? ? ?Social Determinants of Health (SDOH) Interventions ?Alcohol Brief Interventions/Follow-up: Patient Refused ? ?Readmission Risk Interventions ?No flowsheet data found. ? ? ?

## 2021-10-19 NOTE — Progress Notes (Signed)
eLink Physician-Brief Progress Note ?Patient Name: Troy Sampson ?DOB: 11-Oct-1955 ?MRN: [id number] ? ? ?Date of Service ? 10/19/2021  ?HPI/Events of Note ? Anemia - Hgb = 5.1.   ?eICU Interventions ? Will transfuse 2 units PRBC now.  ? ? ? ?Intervention Category ?Major Interventions: Other: ? ?Tysin Salada Dennard Nip ?10/19/2021, 5:05 AM ?

## 2021-10-19 NOTE — Progress Notes (Signed)
Peripherally Inserted Central Catheter Placement ? ?The IV Nurse has discussed with the patient and/or persons authorized to consent for the patient, the purpose of this procedure and the potential benefits and risks involved with this procedure.  The benefits include less needle sticks, lab draws from the catheter, and the patient may be discharged home with the catheter. Risks include, but not limited to, infection, bleeding, blood clot (thrombus formation), and puncture of an artery; nerve damage and irregular heartbeat and possibility to perform a PICC exchange if needed/ordered by physician.  Alternatives to this procedure were also discussed.  Bard Power PICC patient education guide, fact sheet on infection prevention and patient information card has been provided to patient /or left at bedside.   ? ?PICC Placement Documentation  ?PICC Single Lumen 10/19/21 Left Brachial 45 cm (Active)  ? ? ?Telephone consent ? ? ?Stacie Glaze Horton ?10/19/2021, 10:38 AM ? ?

## 2021-10-19 NOTE — Progress Notes (Signed)
? ?NAME:  Troy Sampson, MRN:  [id number], DOB:  [birth year], LOS: 57 ?ADMISSION DATE:  09/21/2021, CONSULTATION DATE:  09/22/2021 ?REFERRING MD:  [facility], CHIEF COMPLAINT:  Shortness of Breath  ? ?History of Present Illness:  ?66 y/o male with a PMHx of duodenal ulcer, IDA, nephrolithiasis and a current smoker who presented from [facility] with c/o SOB x 1 week. History obtained by his wife due to patient arrived obtunded, markedly hypoxic oxygen sats in the low 80s. Patient placed on BiPAP and given Ativan. He was fairly tachypnis with RR in 61s as well as tachycardia and was ultimately intubated for acute hypoxic respiratory failure and increased work of breathing. Noted to have a fever of 101.8. CXR show a LLL pneumonia. PCCM consulted for ICU admission.  ? 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 ?Significant Hospital Events: ?Including procedures, antibiotic start and stop dates in addition to other pertinent events   ?2/22: Admitted overnight to ICU. Blood cultures 3/4 positive for strep pneumo. Strep pneumo antigen positive, TTE with EF 50 to 55% no wall motion abnormalities ?2/26: New onset fevers. Antibiotics broadened to Cefepime and Vancomycin. Propofol switched to Precedex.  ?2/27: Bradycardia noted on Precedex with increased tachypnea.  ?2/28, antibiotics changed to penicillin G per infectious disease recommendation ?3/1: Vasoactive drip requirements improving, received 1 unit of blood for hemoglobin of 6.1 ?3/2 follow-up hemoglobin 6.8, received second unit of blood, has also received 3 units of platelets since admission.  Off norepinephrine  ?3/3 EGD> grade D esophagitis with bleeding ?3/4 tracheostomy ?3/7 ongoing tachypnea limiting SBT. Unable to SBT at all this morning. Overnight episode of bradycardia when being turned requiring atropine ?3/15 palliative consulted, made limited code with no CPR ?3/17 ongoing palliative care  discussions ?3/18 Transfused 1 unit PRBC for low hemoglobin with no active bleed ? ?Interim History / Subjective:  ?Patient reports improvement of the right knee symptoms. ? ?Objective   ?Blood pressure 121/63, pulse (!) 114, temperature 99.6 ?F (37.6 ?C), temperature source Oral, resp. rate 18, height 5\' 7"  (1.702 m), weight 49.8 kg, SpO2 100 %. ?   ?Vent Mode: PRVC ?FiO2 (%):  [35 %-40 %] 40 % ?Set Rate:  [18 bmp] 18 bmp ?Vt Set:  [500 mL] 500 mL ?PEEP:  [5 cmH20] 5 cmH20 ?Pressure Support:  [10 cmH20] 10 cmH20 ?Plateau Pressure:  [20 cmH20-23 cmH20] 23 cmH20  ? ?Intake/Output Summary (Last 24 hours) at 10/19/2021 0727 ?Last data filed at 10/19/2021 [id number] ?Gross per 24 hour  ?Intake 2507.25 ml  ?Output 1980 ml  ?Net 527.25 ml  ? ? ?Filed Weights  ? 10/16/21 0500 10/18/21 0331 10/19/21 0500  ?Weight: 55.6 kg 54.5 kg 49.8 kg  ? ?Physical exam: ?Gen:      No acute distress, awake and alert ?HEENT:  EOMI, sclera anicteric ?Neck:     Trach in place ?Lungs:    normal respiratory effort ?CV:         Tachycardic, regular rhythm, no murmur ?Abd:      + bowel sounds; soft, non-tender; black tarry stool seen in the pouch ?Ext:    2-3+ edema over upper extremity ?Skin:      Warm and dry; no rash ? ?Resolved Hospital Problem list   ?Septic shock ?Lactic acidosis ?Acute urinary retention ?Thrombocytopenia ?Type 2 MI ?Urinary retention ?Aspiration pneumonia ? 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  ?Continue lung protective ventilation ?VAP prevention place ?Routine trach care and bronchial hygiene ? ?Acute on chronic blood loss anemia ?Upper GI bleeding from esophagitis, grade D ?GERD ?Hemoglobin 5.1 this morning s/p 1 unit of transfusion yesterday.  Unable to place PICC line for lab collection.  He was transfused 2 units this morning. ?Suspect ongoing  upper GI bleed, supported by elevated BUN. GI does not have any additional recommendation.  No aggressive interventions.  Continue supportive care. ?Hold heparin for DVT proph until hemoglobin stable ?Continue PPI and Pepcid for his GERD ? ?Right knee calcium pyrophosphate crystal disease ?22,000 WBC with 95% neutrophils.  There was intra articular calcium pyrophosphate deposition.  Low suspicion for septic joint. ?-Will proceed with steroid injection today ? ?Hypertension ?Tachycardia 2/2 acute blood loss anemia ?-Continue Lopressor 12.5 mg BID.  ?-Continue holding amlodipine ? ?Severe protein calorie malnutrition, cachexia ?Continue TF through cortrak ?Continue free water flushes.  ? ?HLDX ?cont statin ?  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 PSA minimally elevated so low suspicion for that being primary. AFP negative. Discussed pursuing IR biopsy in the future with family but given critical illness, patient is not currently stable enough. Suspect this is chronic given last IVDA occurred over 30 years ago. Viral load elevated at 27, 200,000.  ?Holding Tylenol ? ?Tobacco dependence ?Continue nicotine patch ?Smoking cessation counseling provided ? ?Best Practice (right click and "Reselect all SmartList Selections" daily)  ? ?Diet/type: tubefeeds ?DVT prophylaxis: SCD.  DVT prophylaxis until hemoglobin stable ?GI prophylaxis: PPI, H2 blocker ?Lines: N/A ?Foley:  N/A ?Code Status:  Partial. No CPR ?Last date of multidisciplinary goals of care discussion:  ? ?Critical care time:  ? ?Gaylan Gerold, DO ? ?

## 2021-10-19 NOTE — Progress Notes (Signed)
Occupational Therapy Treatment ?Patient Details ?Name: Troy Sampson ?MRN: [id number] ?DOB: 1955/09/21 ?Today's Date: 10/19/2021 ? ? ?History of present illness Pt is a 66 y/o male who presented to the ED 2/22 with SOB. After arrival, pt became obtunded due to severe hypoxia and subsequently intubated emergently. Pt admitted with dx of acute hypoxic/hypercapnic respiratory failure 2nd to community acquired pneumonia.  Further testing revealed Hep C, suspected COPD, and potential underlying malignancy. R knee aspiration on 3/20. Pt underwent tracheostomy 3/4. PMHx: duodenal ulcer, IDA, nephrolithiasis, ETOH ?  ?OT comments ? Pt with minimal progress towards OT goals today; goals downgraded and extended accordingly. Due to low hgb and difficulty with transfusion, OT session completed bed level today. Emphasis on AAROM of BUE to combat edema (R>L today with noted warmth of R wrist), facilitate strengthening exercises and prevent joint stiffness. Provided squeeze ball to improve grasp strength for holding ADL items, accessing call bell, etc. Pt endorses fatigue during exercises, requiring rest breaks and nods head "yes" when asked if today was a bad day. DC recs for LTACH remain appropriate. ? ?HR 100s sustained with exercises ?SpO2 98% on PRVC, 40% FiO2, PEEP 5  ? ?Recommendations for follow up therapy are one component of a multi-disciplinary discharge planning process, led by the attending physician.  Recommendations may be updated based on patient status, additional functional criteria and insurance authorization. ?   ?Follow Up Recommendations ? OT at Long-term acute care hospital  ?  ?Assistance Recommended at Discharge Frequent or constant Supervision/Assistance  ?Patient can return home with the following ? Two people to help with walking and/or transfers;Two people to help with bathing/dressing/bathroom;Assistance with cooking/housework;Assistance with feeding;Direct supervision/assist for medications  management;Direct supervision/assist for financial management;Assist for transportation;Help with stairs or ramp for entrance ?  ?Equipment Recommendations ? Hospital bed;Other (comment) (hoyer lift)  ?  ?Recommendations for Other Services   ? ?  ?Precautions / Restrictions Precautions ?Precautions: Fall;Other (comment) ?Precaution Comments: vent via trach, NG tube, flexiseal ?Restrictions ?Weight Bearing Restrictions: No  ? ? ?  ? ?Mobility Bed Mobility ?  ?  ?  ?  ?  ?  ?  ?General bed mobility comments: deferred OOB due to low hgb ?  ? ?Transfers ?  ?  ?  ?  ?  ?  ?  ?  ?  ?General transfer comment: deferred due to low hgb ?  ?  ?Balance   ?  ?  ?  ?  ?  ?  ?  ?  ?  ?  ?  ?  ?  ?  ?  ?  ?  ?  ?   ? ?ADL either performed or assessed with clinical judgement  ? ?ADL Overall ADL's : Needs assistance/impaired ?  ?  ?  ?  ?  ?  ?  ?  ?  ?  ?  ?  ?  ?  ?  ?  ?  ?  ?  ?General ADL Comments: declined to engage in basic ADLs bed level. focus on AAROM, encouraging pt to have wife//staff assist prn to further engage muscle activation. ?  ? ?Extremity/Trunk Assessment Upper Extremity Assessment ?Upper Extremity Assessment: RUE deficits/detail;LUE deficits/detail ?RUE Deficits / Details: edema more pronounced in this UE surrounding hand, wrist, forearm and elbow. Pt R wrist slightly red and warm. ROM impaired at digits, hand and elbow due to elbow. AAROM of shoulder WFL. difficulty with grasp but observed muscle activation ?RUE Coordination: decreased fine motor;decreased gross motor ?LUE  Deficits / Details: no significant edema in this UE; able to grasp squeeze ball though weak. AAROM ROM WFL ?LUE Coordination: decreased fine motor;decreased gross motor ?  ?Lower Extremity Assessment ?Lower Extremity Assessment: Defer to PT evaluation ?  ?  ?  ? ?Vision   ?Vision Assessment?: No apparent visual deficits ?  ?Perception   ?  ?Praxis   ?  ? ?Cognition Arousal/Alertness: Awake/alert ?Behavior During Therapy: Flat affect ?Overall  Cognitive Status: Impaired/Different from baseline ?Area of Impairment: Attention, Following commands, Safety/judgement, Awareness, Problem solving ?  ?  ?  ?  ?  ?  ?  ?  ?  ?Current Attention Level: Sustained ?  ?Following Commands: Follows one step commands with increased time, Follows one step commands inconsistently ?Safety/Judgement: Decreased awareness of safety, Decreased awareness of deficits ?Awareness: Intellectual ?Problem Solving: Slow processing, Decreased initiation, Difficulty sequencing, Requires verbal cues, Requires tactile cues ?General Comments: flat affect but alert; inconsistent following of commands for AAROM though feel likely due to depressed affect as pt does nod head "yes" when asked if it was a bad day ?  ?  ?   ?Exercises Exercises: Other exercises ?Other Exercises ?Other Exercises: composite flexion/extension, wrist flexion/extension; elbow extension/flexion, shoulder flexion/extension, shoulder abduction/adduction; 5 reps each side, spouse educated on exercise ?Other Exercises: squeeze ball x 5 B hands ? ?  ?Shoulder Instructions   ? ? ?  ?General Comments    ? ? ?Pertinent Vitals/ Pain       Pain Assessment ?Pain Assessment: Faces ?Faces Pain Scale: Hurts little more ?Pain Location: R wrist ROM; R knee only a little sore ?Pain Descriptors / Indicators: Grimacing, Discomfort, Sore ?Pain Intervention(s): Monitored during session, Limited activity within patient's tolerance ? ?Home Living   ?  ?  ?  ?  ?  ?  ?  ?  ?  ?  ?  ?  ?  ?  ?  ?  ?  ?  ? ?  ?Prior Functioning/Environment    ?  ?  ?  ?   ? ?Frequency ? Min 2X/week  ? ? ? ? ?  ?Progress Toward Goals ? ?OT Goals(current goals can now be found in the care plan section) ? Progress towards OT goals: Not progressing toward goals - comment ? ?Acute Rehab OT Goals ?Patient Stated Goal: none stated today ?OT Goal Formulation: With patient/family ?Time For Goal Achievement: 11/02/21 ?Potential to Achieve Goals: Fair  ?Plan Discharge plan  remains appropriate   ? ?Co-evaluation ? ? ?   ?  ?  ?  ?  ? ?  ?AM-PAC OT "6 Clicks" Daily Activity     ?Outcome Measure ? ? Help from another person eating meals?: Total ?Help from another person taking care of personal grooming?: Total ?Help from another person toileting, which includes using toliet, bedpan, or urinal?: Total ?Help from another person bathing (including washing, rinsing, drying)?: Total ?Help from another person to put on and taking off regular upper body clothing?: Total ?Help from another person to put on and taking off regular lower body clothing?: Total ?6 Click Score: 6 ? ?  ?End of Session Equipment Utilized During Treatment: Other (comment) (vent) ? ?OT Visit Diagnosis: Other abnormalities of gait and mobility (R26.89);Unsteadiness on feet (R26.81);Muscle weakness (generalized) (M62.81);Feeding difficulties (R63.3);Other symptoms and signs involving the nervous system (R29.898);Other symptoms and signs involving cognitive function;Pain ?  ?Activity Tolerance Patient limited by fatigue ?  ?Patient Left in bed;with call bell/phone within reach ?  ?Nurse  Communication Mobility status ?  ? ?   ? ?Time: [id number] ?OT Time Calculation (min): 19 min ? ?Charges: OT General Charges ?$OT Visit: 1 Visit ?OT Treatments ?$Therapeutic Exercise: 8-22 mins ? ?Bradd CanaryJulie B, OTR/L ?Acute Rehab Services ?Office: [phone]  ? ?Lorre MunroeJulie  Bevan Disney ?10/19/2021, 12:48 PM ?

## 2021-10-19 NOTE — Progress Notes (Signed)
Palliative: ? ?Update received from Dr. Cyndie Chime: planning to proceed with trach collar trials today and family in agreement - PMT to follow peripherally; allow time for outcomes - will plan to reengage later in week. Please chat/call for additional needs. ? ?Gerlean Ren, DNP, AGNP-C ?Palliative Medicine Team ?Team Phone # [phone]  ?Pager # [phone] ? ?NO CHARGE ?

## 2021-10-19 NOTE — Progress Notes (Signed)
OT Cancellation Note ? ?Patient Details ?Name: Troy Sampson ?MRN: [id number] ?DOB: Dec 08, 1955 ? ? ?Cancelled Treatment:    Reason Eval/Treat Not Completed: Other (comment) Planned to see pt at 11:30AM for OOB attempts pending blood transfusion completion (5.1 hgb early this AM). However, pt IV infiltrated and did not receive all blood products and pt currently being assisted with peri care by staff. Will hold for now and follow-up for bed level session due to low hgb. ? ?Layla Maw ?10/19/2021, 11:41 AM ?

## 2021-10-19 NOTE — Procedures (Signed)
PROCEDURE NOTE ? ?PROCEDURE: right knee joint steroid injection. ? 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 ? 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 ? ?PROCEDURE: The patient was apprised of the risks and the benefits of the procedure and informed consent was obtained, as witnessed by April Lewis, RN. Time-out procedure was performed, with confirmation of the patient's name, date of birth, and correct identification of the right knee to be injected. The patient's knee was then marked at the appropriate site for injection placement. The knee was sterilely prepped with Chlorhexidine. A 60 mg (1.5 milliliter) solution of Kenalog was drawn up into a 5 mL syringe with a 1 mL of 1% lidocaine. The patient was injected with a 23-gauge needle at the superior lateral aspect of his right extended knee. There were no complications. The patient tolerated the procedure well. There was minimal bleeding.  ? ?The procedure was supervised by attending physician, Dr. Lynetta Mare.  ?

## 2021-10-20 ENCOUNTER — Inpatient Hospital Stay (HOSPITAL_COMMUNITY): Payer: Medicare Other

## 2021-10-20 LAB — BPAM RBC
Blood Product Expiration Date: 202303282359
Blood Product Expiration Date: 202303292359
Blood Product Expiration Date: 202304142359
Blood Product Expiration Date: 202304142359
Blood Product Expiration Date: 202304152359
Blood Product Expiration Date: 202304152359
ISSUE DATE / TIME: 202303180321
ISSUE DATE / TIME: 202303201100
ISSUE DATE / TIME: 202303210610
ISSUE DATE / TIME: 202303210910
ISSUE DATE / TIME: 202303211608
ISSUE DATE / TIME: 202303211753
Unit Type and Rh: 7300
Unit Type and Rh: 7300
Unit Type and Rh: 7300
Unit Type and Rh: 7300
Unit Type and Rh: 7300
Unit Type and Rh: 7300

## 2021-10-20 LAB — TYPE AND SCREEN
ABO/RH(D): B POS
Antibody Screen: NEGATIVE
Unit division: 0
Unit division: 0
Unit division: 0
Unit division: 0
Unit division: 0
Unit division: 0

## 2021-10-20 LAB — FERRITIN: Ferritin: 273 ng/mL (ref 24–336)

## 2021-10-20 LAB — CBC
HCT: 22.6 % — ABNORMAL LOW (ref 39.0–52.0)
Hemoglobin: 7.2 g/dL — ABNORMAL LOW (ref 13.0–17.0)
MCH: 27.9 pg (ref 26.0–34.0)
MCHC: 31.9 g/dL (ref 30.0–36.0)
MCV: 87.6 fL (ref 80.0–100.0)
Platelets: 250 10*3/uL (ref 150–400)
RBC: 2.58 MIL/uL — ABNORMAL LOW (ref 4.22–5.81)
RDW: 20.4 % — ABNORMAL HIGH (ref 11.5–15.5)
WBC: 27.2 10*3/uL — ABNORMAL HIGH (ref 4.0–10.5)
nRBC: 0.3 % — ABNORMAL HIGH (ref 0.0–0.2)

## 2021-10-20 LAB — GLUCOSE, CAPILLARY
Glucose-Capillary: 130 mg/dL — ABNORMAL HIGH (ref 70–99)
Glucose-Capillary: 139 mg/dL — ABNORMAL HIGH (ref 70–99)
Glucose-Capillary: 139 mg/dL — ABNORMAL HIGH (ref 70–99)
Glucose-Capillary: 146 mg/dL — ABNORMAL HIGH (ref 70–99)
Glucose-Capillary: 162 mg/dL — ABNORMAL HIGH (ref 70–99)

## 2021-10-20 LAB — IRON AND TIBC
Iron: 23 ug/dL — ABNORMAL LOW (ref 45–182)
Saturation Ratios: 12 % — ABNORMAL LOW (ref 17.9–39.5)
TIBC: 189 ug/dL — ABNORMAL LOW (ref 250–450)
UIBC: 166 ug/dL

## 2021-10-20 LAB — RETICULOCYTES
Immature Retic Fract: 19.8 % — ABNORMAL HIGH (ref 2.3–15.9)
RBC.: 2.52 MIL/uL — ABNORMAL LOW (ref 4.22–5.81)
Retic Count, Absolute: 82.2 10*3/uL (ref 19.0–186.0)
Retic Ct Pct: 3.3 % — ABNORMAL HIGH (ref 0.4–3.1)

## 2021-10-20 MED ORDER — AMLODIPINE BESYLATE 5 MG PO TABS
5.0000 mg | ORAL_TABLET | Freq: Every day | ORAL | Status: DC
  Administered 2021-10-20 – 2021-10-21 (×2): 5 mg
  Filled 2021-10-20 (×2): qty 1

## 2021-10-20 NOTE — Progress Notes (Signed)
? ?NAME:  Troy Sampson, MRN:  [id number], DOB:  1956-02-26, LOS: 28 ?ADMISSION DATE:  09/21/2021, CONSULTATION DATE:  09/22/2021 ?REFERRING MD:  [facility], CHIEF COMPLAINT:  Shortness of Breath  ? ?History of Present Illness:  ?66 y/o male with a PMHx of duodenal ulcer, IDA, nephrolithiasis and a current smoker who presented from [facility] with c/o SOB x 1 week. History obtained by his wife due to patient arrived obtunded, markedly hypoxic oxygen sats in the low 80s. Patient placed on BiPAP and given Ativan. He was fairly tachypnis with RR in 40s as well as tachycardia and was ultimately intubated for acute hypoxic respiratory failure and increased work of breathing. Noted to have a fever of 101.8. CXR show a LLL pneumonia. PCCM consulted for ICU admission.  ? 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 ?Significant Hospital Events: ?Including procedures, antibiotic start and stop dates in addition to other pertinent events   ?2/22: Admitted overnight to ICU. Blood cultures 3/4 positive for strep pneumo. Strep pneumo antigen positive, TTE with EF 50 to 55% no wall motion abnormalities ?2/26: New onset fevers. Antibiotics broadened to Cefepime and Vancomycin. Propofol switched to Precedex.  ?2/27: Bradycardia noted on Precedex with increased tachypnea.  ?2/28, antibiotics changed to penicillin G per infectious disease recommendation ?3/1: Vasoactive drip requirements improving, received 1 unit of blood for hemoglobin of 6.1 ?3/2 follow-up hemoglobin 6.8, received second unit of blood, has also received 3 units of platelets since admission.  Off norepinephrine  ?3/3 EGD> grade D esophagitis with bleeding ?3/4 tracheostomy ?3/7 ongoing tachypnea limiting SBT. Unable to SBT at all this morning. Overnight episode of bradycardia when being turned requiring atropine ?3/15 palliative consulted, made limited code with no CPR ?3/17 ongoing palliative care  discussions ?3/18 Transfused 1 unit PRBC for low hemoglobin with no active bleed ? ?Interim History / Subjective:  ?Patient appears fatigued.  He is breathing fast and shallow.  States that he would like to go back to sleep ? ?Objective   ?Blood pressure (!) 153/62, pulse (!) 123, temperature 99.7 ?F (37.6 ?C), temperature source Oral, resp. rate (!) 29, height 5\' 7"  (1.702 m), weight 53.3 kg, SpO2 98 %. ?   ?Vent Mode: PRVC ?FiO2 (%):  [35 %-40 %] 40 % ?Set Rate:  [18 bmp] 18 bmp ?Vt Set:  [500 mL] 500 mL ?PEEP:  [5 cmH20] 5 cmH20 ?Plateau Pressure:  [21 cmH20-24 cmH20] 24 cmH20  ? ?Intake/Output Summary (Last 24 hours) at 10/20/2021 0742 ?Last data filed at 10/20/2021 0600 ?Gross per 24 hour  ?Intake 2838.33 ml  ?Output 1200 ml  ?Net 1638.33 ml  ? ? ?Filed Weights  ? 10/18/21 0331 10/19/21 0500 10/20/21 0500  ?Weight: 54.5 kg 49.8 kg 53.3 kg  ? ?Physical exam: ?Gen:   Tired appearance. In no acute distress ?HEENT:  EOMI, sclera anicteric ?Neck:     Trach in place ?Lungs:    tachypnea, shallow breathing, accessory muscle use ?CV:         Tachycardic, regular rhythm, no murmur ?Abd:      + bowel sounds; soft, non-tender; black tarry stool seen in the pouch ?Ext:    2-3+ edema over upper extremity ?Skin:      Warm and dry; no rash ? ?Resolved Hospital Problem list   ?Septic shock ?Lactic acidosis ?Acute urinary retention ?Thrombocytopenia ?Type 2 MI ?Urinary retention ?Aspiration pneumonia ? 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 Will cut back to 1-2 hour session this afternoon and continue this for the next few days.  ?Continue lung protective ventilation ?VAP prevention place ?Routine trach care and bronchial hygiene ?No acute significant change on CXR ? ?Acute on chronic blood loss anemia ?Upper GI bleeding from esophagitis, grade D ?GERD ?Hgb improved to 7.2 s/p 5 units  total. Not in hemorraghic shock. Doubt massive bleed.  ?Suspect ongoing upper GI bleed. GI does not have any additional recommendation.  No aggressive interventions.  Continue supportive care. If not transition to comfort care, can consider PEG placement to avoid irritation of esophagus.  ?Hold heparin for DVT proph until hemoglobin stable ?Continue PPI and Pepcid for his GERD ? ?Right knee calcium pyrophosphate crystal disease ?-s/p steroid injection ? ?Hypertension ?Tachycardia 2/2 acute blood loss anemia ?-Continue Lopressor 12.5 mg BID. If tachycardia worsens, will increase Lopressor. ?-Resume low dose Amlodipine 5 mg ? ?Severe protein calorie malnutrition, cachexia ?Continue TF through cortrak ?Continue free water flushes.  ? ?HLDX ?cont statin ?  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 PSA minimally elevated so low suspicion for that being primary. AFP negative. Discussed pursuing IR biopsy in the future with family but given critical illness, patient is not currently stable enough. Suspect this is chronic given last IVDA occurred over 30 years ago. Viral load elevated at 27, 200,000.  ? ?Tobacco dependence ?Continue nicotine patch ?Smoking cessation counseling provided ? ?Best Practice (right click and "Reselect all SmartList Selections" daily)  ? ?Diet/type: tubefeeds ?DVT prophylaxis: SCD.  DVT prophylaxis until hemoglobin stable ?GI prophylaxis: PPI, H2 blocker ?Lines: N/A ?Foley:  N/A ?Code Status:  Partial. No CPR ?Last date of multidisciplinary goals of care discussion:  ? ?Critical care time:  ? ?Doran Stabler, DO ? ?

## 2021-10-20 NOTE — TOC Progression Note (Addendum)
Transition of Care (TOC) - Initial/Assessment Note  ? ? ?Patient Details  ?Name: Troy Sampson ?MRN: [id number] ?Date of Birth: July 27, 1956 ? ?Transition of Care (TOC) CM/SW Contact:    ?Paulene Floor Jeanelle Dake, LCSWA ?Phone Number: ?10/20/2021, 2:39 PM ? ?Clinical Narrative:                 ?CSW spoke with the patient's daughter, Troy Sampson, about discharge plans.  CSW explained the difficulty with placement because the patient is underinsured.  The patient's daughter reported that she will look into getting the patient Medicare Part B, inquiring about whether the patient has VA benefits, and inquiring about coverage from the job that he retired from.   ? ?CSW will continue to follow. ? ?Expected Discharge Plan: [place] ?  ? ? ?Patient Goals and CMS Choice ?  ?  ?  ? ?Expected Discharge Plan and Services ?Expected Discharge Plan: [place] ?Prior Living Arrangements/Services ?  ?  ?  ?       ?  ?  ?  ?  ? ?Activities of Daily Living ?  ?  ? ?Permission Sought/Granted ?  ?  ?   ?   ?   ?   ? ?Emotional Assessment ?  ?  ?  ?  ?  ?  ? ?Admission diagnosis:  Acute respiratory failure with hypoxia ([place]) [J96.01] ?Community acquired pneumonia of left lower lobe of lung [J18.9] ?Sepsis, due to unspecified organism, unspecified whether acute organ dysfunction present ([place]) [A41.9] ?Acute respiratory failure ([place]) [J96.00] ?Patient Active Problem List  ? Diagnosis Date Noted  ? Status post tracheostomy ([place])   ? Protein-calorie malnutrition, severe 10/01/2021  ? Pressure injury of skin 09/30/2021  ? Pneumonia, pneumococcal ([place]) 09/30/2021  ? Pneumococcal bacteremia 09/30/2021  ? Acute metabolic encephalopathy [id number]  ? Alteration in electrolyte and fluid balance 09/30/2021  ? Hepatitis C 09/30/2021  ? Liver mass 09/30/2021  ? Anemia 09/30/2021  ? NSTEMI (non-ST elevated myocardial infarction) ([place]) 09/30/2021  ? Thrombocytopenia ([place]) 09/30/2021   ? Acute respiratory failure ([place]) 09/22/2021  ? Protein calorie malnutrition ([place]) 09/22/2021  ? Acute respiratory failure with hypoxia ([place])   ? Sepsis ([place])   ? Community acquired pneumonia of left lower lobe of lung 09/21/2021  ? ?PCP:  Pcp, No ?Pharmacy:   ?[id number] [place], [place] [address] ?[place] [address] ?Phone: [phone] Fax: [phone] ? ? ? ? ?Social Determinants of Health (SDOH) Interventions ?Alcohol Brief Interventions/Follow-up: Patient Refused ? ?Readmission Risk Interventions ?   ? View : No data to display.  ?  ?  ?  ? ? ? ?

## 2021-10-20 NOTE — Progress Notes (Signed)
SLP Cancellation Note ? ?Patient Details ?Name: Troy Sampson ?MRN: [id number] ?DOB: February 13, 1956 ? ? ?Cancelled treatment:       Reason Eval/Treat Not Completed: Other (comment). Following chart. Pt with goal of tolerating simple ATC trials. Brief trial planned for this afternoon to limit fatigue. Will f/u tomorrow for PMSV readiness when tolerating ATC without significant respiratory fatigue.  ? ? ?Troy Sampson, Riley Nearing ?10/20/2021, 9:39 AM ?

## 2021-10-20 NOTE — Progress Notes (Signed)
PT Cancellation Note ? ?Patient Details ?Name: Troy Sampson ?MRN: [id number] ?DOB: Nov 25, 1955 ? ? ?Cancelled Treatment:    Reason Eval/Treat Not Completed: (P) Fatigue/lethargy limiting ability to participate Pt had weaning trial this morning and is very fatigued. MD request follow back tomorrow for therapy. ? ?Latresha Yahr B. Beverely Risen PT, DPT ?Acute Rehabilitation Services ?Pager [phone] ?Office [phone] ? ? ? ?Elon Alas Fleet ?10/20/2021, 10:05 AM ? ? ?

## 2021-10-20 NOTE — Progress Notes (Signed)
?                                                   ?Daily Progress Note  ? ?Patient Name: Troy Sampson       Date: 10/20/2021 ?DOB: Mar 01, 1956  Age: 66 y.o. MRN#: [id number] ?Attending Physician: Kipp Brood, MD ?Primary Care Physician: Pcp, No ?Admit Date: 09/21/2021 ? ?Reason for Consultation/Follow-up: Establishing goals of care ? ?Subjective: ?Patient in bed connected to ventilator, mouths to me that he thinks he feels a little better today ? ?Length of Stay: 28 ? ?Current Medications: ?Scheduled Meds:  ? sodium chloride   Intravenous Once  ? amLODipine  5 mg Per Tube Daily  ? atorvastatin  40 mg Per Tube Daily  ? chlorhexidine gluconate (MEDLINE KIT)  15 mL Mouth Rinse BID  ? Chlorhexidine Gluconate Cloth  6 each Topical Daily  ? famotidine  20 mg Per Tube Daily  ? feeding supplement (PROSource TF)  45 mL Per Tube BID  ? fiber  1 packet Per Tube BID  ? folic acid  1 mg Per Tube Daily  ? free water  100 mL Per Tube Q6H  ? guaiFENesin  10 mL Per Tube Q6H  ? insulin aspart  0-9 Units Subcutaneous Q4H  ? ipratropium-albuterol  3 mL Nebulization Q6H  ? lidocaine  5 mL Intradermal Once  ? mouth rinse  15 mL Mouth Rinse 10 times per day  ? metoprolol tartrate  12.5 mg Per Tube BID  ? multivitamin with minerals  1 tablet Per Tube Daily  ? nicotine  7 mg Transdermal Daily  ? pantoprazole (PROTONIX) IV  40 mg Intravenous Q12H  ? sodium chloride flush  10-40 mL Intracatheter Q12H  ? sodium chloride flush  10-40 mL Intracatheter Q12H  ? thiamine  100 mg Per Tube Daily  ? ? ?Continuous Infusions: ? sodium chloride 10 mL/hr at 10/01/21 1500  ? sodium chloride 10 mL/hr at 10/18/21 7619  ? feeding supplement (VITAL 1.5 CAL) 1,000 mL (10/20/21 0600)  ? ? ?PRN Meds: ?Place/Maintain arterial line **AND** sodium chloride, sodium chloride, albuterol, atropine, docusate, fentaNYL (SUBLIMAZE)  injection, hydrALAZINE, oxyCODONE, polyethylene glycol, sodium chloride flush, sodium chloride flush, white petrolatum ? ?Physical Exam ?Constitutional:   ?   General: He is not in acute distress. ?   Appearance: He is ill-appearing.  ?   Comments: Frail, cachectic  ?Pulmonary:  ?   Comments: On full support of ventilator during my visit through trach ?Skin: ?   General: Skin is warm and dry.  ?Neurological:  ?   Comments: Difficult to assess  ?         ? ?Vital Signs: BP (!) 162/72   Pulse (!) 124   Temp 98.7 ?F (37.1 ?C) (Oral)   Resp (!) 28   Ht 5' 7"  (1.702 m)   Wt 53.3 kg   SpO2 94%   BMI 18.40 kg/m?  ?SpO2: SpO2: 94 % ?O2 Device: O2 Device: Ventilator ?O2 Flow Rate: O2 Flow Rate (L/min): 10 L/min ? ?Intake/output summary:  ?Intake/Output Summary (Last 24 hours) at 10/20/2021 1509 ?Last data filed at 10/20/2021 1504 ?Gross per 24 hour  ?Intake 2186.67 ml  ?Output 851 ml  ?Net 1335.67 ml  ? ? ?LBM: Last BM Date : 10/20/21 ?Baseline Weight: Weight: 46.3 kg ?Most recent weight: Weight: 53.3 kg ? ?    ? ?  Flowsheet Rows   ? ?Flowsheet Row Most Recent Value  ?Intake Tab   ?Referral Department Critical care  ?Unit at Time of Referral ICU  ?Palliative Care Primary Diagnosis Sepsis/Infectious Disease  ?Date Notified 10/12/21  ?Palliative Care Type New Palliative care  ?Reason for referral Clarify Goals of Care  ?Date of Admission 09/22/21  ?Date first seen by Palliative Care 10/13/21  ?# of days Palliative referral response time 1 Day(s)  ?# of days IP prior to Palliative referral 20  ?Clinical Assessment   ?Psychosocial & Spiritual Assessment   ?Palliative Care Outcomes   ? ?  ? ? ?Patient Active Problem List  ? Diagnosis Date Noted  ? Status post tracheostomy ([place])   ? Protein-calorie malnutrition, severe 10/01/2021  ? Pressure injury of skin 09/30/2021  ? Pneumonia, pneumococcal ([place]) 09/30/2021  ? Pneumococcal bacteremia 09/30/2021  ? Acute metabolic encephalopathy 13/03/6577  ? Alteration in electrolyte and  fluid balance 09/30/2021  ? Hepatitis C 09/30/2021  ? Liver mass 09/30/2021  ? Anemia 09/30/2021  ? NSTEMI (non-ST elevated myocardial infarction) ([place]) 09/30/2021  ? Thrombocytopenia ([place]) 09/30/2021  ? Acute respiratory failure ([place]) 09/22/2021  ? Protein calorie malnutrition ([place]) 09/22/2021  ? Acute respiratory failure with hypoxia ([place])   ? Sepsis ([place])   ? Community acquired pneumonia of left lower lobe of lung 09/21/2021  ? ? ?Palliative Care Assessment & Plan  ? ?HPI: ?66 y.o. male  with past medical history of Duodenal ulcer, anemia, and current smoker admitted on 09/21/2021 with shortness of breath.  Ultimately required intubation due to respiratory failure.  Found to have left lower lobe pneumonia.  Also found to have positive blood cultures.  On March 3 he had an EGD that revealed esophagitis-ongoing anemia.  Per GI no other interventions to offer.  On March 4 patient had tracheostomy placed.  Patient with episodes of bradycardia throughout hospitalization.  Patient also found to have 3 solitary liver masses suspicious for malignancy.  PMT consulted to discuss goals of care. ? ?Assessment: ?Discussed patient's situation with Dr. Lynetta Mare and Dr. Alfonse Spruce -reviewed current clinical situation and changes in care over the past couple of days.  Reviewed that patient is not tolerating trach collar for long periods of time.  Reviewed his liver masses, cachexia, and ongoing anemia ? ?Called patient's daughter and discussed the above.  She expresses understanding of the situation although she continues to hope for improvement.  She tells me she has tried to discuss the situation with the patient and he communicates to her that he is hoping the doctors can help him.  We reviewed current interventions being utilized to maintain current situation.  We discussed the little progress despite aggressive medical care.  We discussed concern that patient will be ventilator dependent for a long time if not permanently.  We  discussed concern about his liver mass and how this complicates the picture and and likely contributing to his cachexia.  We discussed that his feeding tube is clogged and potential of needing a PEG tube next.  We discussed planning for LTAC placement if we continue current level of care.  We discussed the overall concern of poor prognosis. ? ?Recommendations/Plan: ?Ongoing conversations with patient and family to determine goals of care ?Family uncertain of how to move forward ?Family interested in further discussion about PEG tube ? ?Code Status: ?Limited code - no CPR ? ?Care plan was discussed with patient and daughter, critical care team ? ?Thank you for allowing the Palliative Medicine Team to  assist in the care of this patient. ? ?*Please note that this is a verbal dictation therefore any spelling or grammatical errors are due to the "[place] [employer]" system interpretation. ? ?Juel Burrow, DNP, AGNP-C ?Palliative Medicine Team ?Team Phone # [phone]  ?Pager [phone] ? ?

## 2021-10-21 ENCOUNTER — Encounter (HOSPITAL_COMMUNITY): Payer: Self-pay | Admitting: Internal Medicine

## 2021-10-21 ENCOUNTER — Inpatient Hospital Stay (HOSPITAL_COMMUNITY): Payer: Medicare Other

## 2021-10-21 LAB — CBC
HCT: 22.2 % — ABNORMAL LOW (ref 39.0–52.0)
Hemoglobin: 6.9 g/dL — CL (ref 13.0–17.0)
MCH: 28 pg (ref 26.0–34.0)
MCHC: 31.1 g/dL (ref 30.0–36.0)
MCV: 90.2 fL (ref 80.0–100.0)
Platelets: 257 10*3/uL (ref 150–400)
RBC: 2.46 MIL/uL — ABNORMAL LOW (ref 4.22–5.81)
RDW: 19.7 % — ABNORMAL HIGH (ref 11.5–15.5)
WBC: 24.5 10*3/uL — ABNORMAL HIGH (ref 4.0–10.5)
nRBC: 0.1 % (ref 0.0–0.2)

## 2021-10-21 LAB — PREPARE RBC (CROSSMATCH)

## 2021-10-21 LAB — GLUCOSE, CAPILLARY
Glucose-Capillary: 132 mg/dL — ABNORMAL HIGH (ref 70–99)
Glucose-Capillary: 141 mg/dL — ABNORMAL HIGH (ref 70–99)
Glucose-Capillary: 149 mg/dL — ABNORMAL HIGH (ref 70–99)
Glucose-Capillary: 106 mg/dL — ABNORMAL HIGH (ref 70–99)
Glucose-Capillary: 118 mg/dL — ABNORMAL HIGH (ref 70–99)

## 2021-10-21 LAB — BASIC METABOLIC PANEL
Anion gap: 3 — ABNORMAL LOW (ref 5–15)
BUN: 25 mg/dL — ABNORMAL HIGH (ref 8–23)
CO2: 31 mmol/L (ref 22–32)
Calcium: 8.4 mg/dL — ABNORMAL LOW (ref 8.9–10.3)
Chloride: 106 mmol/L (ref 98–111)
Creatinine, Ser: 0.33 mg/dL — ABNORMAL LOW (ref 0.61–1.24)
GFR, Estimated: >60 mL/min (ref >60)
Glucose, Bld: 151 mg/dL — ABNORMAL HIGH (ref 70–99)
Potassium: 4.9 mmol/L (ref 3.5–5.1)
Sodium: 140 mmol/L (ref 135–145)

## 2021-10-21 LAB — HEMOGLOBIN AND HEMATOCRIT, BLOOD
HCT: 24.7 % — ABNORMAL LOW (ref 39.0–52.0)
Hemoglobin: 7.9 g/dL — ABNORMAL LOW (ref 13.0–17.0)

## 2021-10-21 LAB — BODY FLUID CULTURE W GRAM STAIN: Culture: NO GROWTH

## 2021-10-21 LAB — SOLUBLE TRANSFERRIN RECEPTOR: Transferrin Receptor: 34.5 nmol/L — ABNORMAL HIGH (ref 12.2–27.3)

## 2021-10-21 LAB — MAGNESIUM: Magnesium: 1.9 mg/dL (ref 1.7–2.4)

## 2021-10-21 LAB — PHOSPHORUS: Phosphorus: 3.6 mg/dL (ref 2.5–4.6)

## 2021-10-21 MED ORDER — LIDOCAINE 5 % EX PTCH
1.0000 | MEDICATED_PATCH | Freq: Two times a day (BID) | CUTANEOUS | Status: DC | PRN
  Administered 2021-10-21 – 2021-10-28 (×3): 1 via TRANSDERMAL
  Filled 2021-10-21 (×3): qty 1

## 2021-10-21 MED ORDER — FUROSEMIDE 10 MG/ML IJ SOLN
40.0000 mg | Freq: Once | INTRAMUSCULAR | Status: AC
  Administered 2021-10-21: 40 mg via INTRAVENOUS
  Filled 2021-10-21: qty 4

## 2021-10-21 MED ORDER — SODIUM CHLORIDE 0.9 % IV SOLN
250.0000 mg | Freq: Every day | INTRAVENOUS | Status: AC
  Administered 2021-10-21 – 2021-10-22 (×2): 250 mg via INTRAVENOUS
  Filled 2021-10-21 (×2): qty 20

## 2021-10-21 MED ORDER — CEFAZOLIN SODIUM-DEXTROSE 2-4 GM/100ML-% IV SOLN
2.0000 g | INTRAVENOUS | Status: AC
  Filled 2021-10-21: qty 100

## 2021-10-21 MED ORDER — SODIUM CHLORIDE 0.9% IV SOLUTION: Freq: Once | INTRAVENOUS | Status: AC

## 2021-10-21 NOTE — Progress Notes (Signed)
? ?NAME:  Troy Sampson, MRN:  [id number], DOB:  05-Aug-1955, LOS: 29 ?ADMISSION DATE:  09/21/2021, CONSULTATION DATE:  09/22/2021 ?REFERRING MD:  [facility], CHIEF COMPLAINT:  Shortness of Breath  ? ?History of Present Illness:  ?66 y/o male with a PMHx of duodenal ulcer, IDA, nephrolithiasis and a current smoker who presented from [facility] with c/o SOB x 1 week. History obtained by his wife due to patient arrived obtunded, markedly hypoxic oxygen sats in the low 80s. Patient placed on BiPAP and given Ativan. He was fairly tachypnis with RR in 40s as well as tachycardia and was ultimately intubated for acute hypoxic respiratory failure and increased work of breathing. Noted to have a fever of 101.8. CXR show a LLL pneumonia. PCCM consulted for ICU admission.  ? 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 ?Significant Hospital Events: ?Including procedures, antibiotic start and stop dates in addition to other pertinent events   ?2/22: Admitted overnight to ICU. Blood cultures 3/4 positive for strep pneumo. Strep pneumo antigen positive, TTE with EF 50 to 55% no wall motion abnormalities ?2/26: New onset fevers. Antibiotics broadened to Cefepime and Vancomycin. Propofol switched to Precedex.  ?2/27: Bradycardia noted on Precedex with increased tachypnea.  ?2/28, antibiotics changed to penicillin G per infectious disease recommendation ?3/1: Vasoactive drip requirements improving, received 1 unit of blood for hemoglobin of 6.1 ?3/2 follow-up hemoglobin 6.8, received second unit of blood, has also received 3 units of platelets since admission.  Off norepinephrine  ?3/3 EGD> grade D esophagitis with bleeding ?3/4 tracheostomy ?3/7 ongoing tachypnea limiting SBT. Unable to SBT at all this morning. Overnight episode of bradycardia when being turned requiring atropine ?3/15 palliative consulted, made limited code with no CPR ?3/17 ongoing palliative care  discussions ?3/18 Transfused 1 unit PRBC for low hemoglobin with no active bleed ? ?Interim History / Subjective:  ?Patient appears more alert and awake today.  Does not appears any acute distress.  Said that his knee pain has improved. ? ?Objective   ?Blood pressure (!) 146/76, pulse 87, temperature 99.4 ?F (37.4 ?C), temperature source Oral, resp. rate 20, height 5\' 7"  (1.702 m), weight 53.5 kg, SpO2 99 %. ?   ?Vent Mode: PRVC ?FiO2 (%):  [40 %] 40 % ?Set Rate:  [18 bmp] 18 bmp ?Vt Set:  [500 mL] 500 mL ?PEEP:  [5 cmH20] 5 cmH20 ?Plateau Pressure:  [15 cmH20-19 cmH20] 15 cmH20  ? ?Intake/Output Summary (Last 24 hours) at 10/21/2021 0738 ?Last data filed at 10/21/2021 10/23/2021 ?Gross per 24 hour  ?Intake 2390 ml  ?Output 1581 ml  ?Net 809 ml  ? ? ?Filed Weights  ? 10/19/21 0500 10/20/21 0500 10/21/21 0100  ?Weight: 49.8 kg 53.3 kg 53.5 kg  ? ?Physical exam: ?Gen:   More alert and awake. In no acute distress ?HEENT:  EOMI, sclera anicteric ?Neck:     Trach in place ?Lungs:    Normal work of breathing.  Rhonchi heard bilaterally ?CV:         Regular rate and regular rhythm, no murmur ?Abd:      + bowel sounds; soft, non-tender ?Ext:    2-3+ edema over upper extremity ?Skin:      Warm and dry; no rash ? ?Resolved Hospital Problem list   ?Septic shock ?Lactic acidosis ?Acute urinary retention ?Thrombocytopenia ?Type 2 MI ?Urinary retention ?Aspiration pneumonia ? 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  Titrate up slowly to avoid fatigue.  He will need a long period of time to be able to wean off ventilator if he can be weaned off.   ?If family does not want to proceed with comfort care, will look into options for disposition at an LTAC. ?- Continue lung protective ventilation ?- VAP prevention place ?- Routine trach care and bronchial hygiene ? ?Acute on chronic blood loss  anemia ?Upper GI bleeding from esophagitis, grade D ?Iron deficiency anemia ?GERD ?Hemoglobin 6.9 this morning.  Transfuse 1 unit. ?Suspect ongoing bleeding from esophagitis may be contributed from the core track. GI does not have any additional recommendation.  Family and patient both agreed with PEG placement.  We will consult IR ?- Hold heparin for DVT proph until hemoglobin stable ?- Continue PPI and Pepcid for his GERD ?- Will give Ferrlecit for iron supplement ? ?Right knee calcium pyrophosphate crystal disease-improved ?-s/p steroid injection ? ?Hypertension ?Tachycardia-resolved ?-Continue Lopressor 12.5 mg BID and Amlodipine 5 mg ? ?Severe protein calorie malnutrition, cachexia ?Continue TF through cortrak ?Continue free water flushes.  ?Consult IR for PEG tube ? ?HLDX ?cont statin ?  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 PSA minimally elevated so low suspicion for that being primary. AFP negative. Discussed pursuing IR biopsy in the future with family but given critical illness, patient is not currently stable enough. Suspect this is chronic given last IVDA occurred over 30 years ago. Viral load elevated at 27, 200,000.  ? ?Tobacco dependence ?Continue nicotine patch ?Smoking cessation counseling provided ? ?Best Practice (right click and "Reselect all SmartList Selections" daily)  ? ?Diet/type: tubefeeds ?DVT prophylaxis: SCD.  DVT prophylaxis until hemoglobin stable ?GI prophylaxis: PPI, H2 blocker ?Lines: N/A ?Foley:  N/A ?Code Status:  Partial. No CPR ?Last date of multidisciplinary goals of care discussion: We will update family ? ?Critical care time:  ? ?Doran Stabler, DO ? ?

## 2021-10-21 NOTE — Progress Notes (Signed)
Speech Language Pathology Treatment: [name] Speaking valve  ?Patient Details ?Name: Troy Sampson ?MRN: [id number] ?DOB: April 09, 1956 ?Today's Date: 10/21/2021 ?Time: 1749-4496 ?SLP Time Calculation (min) (ACUTE ONLY): 10 min ? ?Assessment / Plan / Recommendation ?Clinical Impression ? Pt was seen for PMV trials shortly after transitioning to ATC by RT. Cuff was deflated and PMV donned, although pt's voice remained very hoarse, soft, and at times, wet. He could clear some secretions tracheally but did not expectorate anything orally today. His dysphonia makes it challenging to understand him at the phrase level, although with guided questioning, pt was able to express that he was in pain (RN notified) and feeling like it was hard to breathe (RT also present). VS remained stable during PMV trial and there was no overt evidence of back pressure. Pt shared that it felt harder to breathe since coming off the vent, but also said he felt better after SLP removed PMV. Will continue to perform PMV trials, preferably while on ATC as it takes such effort for him to try to phonate, although inline PMV is still available in room. Would try PMV with SLP only at this time. ?  ?HPI HPI: 66 y/o male who presented from [facility] with c/o SOB x 1 week. Pt intubated for acute hypoxic respiratory failure and increased work of breathing. Noted to have a fever of 101.8. CXR show a LLL pneumonia. Intubated from 2/22 to 3/4 when trach placed. 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 Palliative care involved as prognosis for meaningful recovery is poor. Pt has not been able to achieve trach collar. ?  ?   ?SLP Plan ? Continue with current plan of care ? ?  ?  ?Recommendations for follow up therapy are one component of a multi-disciplinary discharge planning process, led by the attending  physician.  Recommendations may be updated based on patient status, additional functional criteria and insurance authorization. ?  ? ?Recommendations  ?   ?   ? Patient may use Passy-Muir Speech Valve: with SLP only ?PMSV Supervision: Full  ?   ? ? ? ? Follow Up Recommendations: SLP at Long-term acute care hospital ?Assistance recommended at discharge: Frequent or constant Supervision/Assistance ?SLP Visit Diagnosis: Aphonia (R49.1) ?Plan: Continue with current plan of care ? ? ? ? ?  ?  ? ? ?Mahala Menghini., M.A. CCC-SLP ?Acute Rehabilitation Services ?Pager [phone] ?Office [phone] ? ? ?10/21/2021, 10:52 AM ?

## 2021-10-21 NOTE — Progress Notes (Signed)
Patient transported to CT and back without complications.  

## 2021-10-21 NOTE — Progress Notes (Signed)
?                                                   ?Daily Progress Note  ? ?Patient Name: Troy Sampson       Date: 10/21/2021 ?DOB: 12-23-1955  Age: 66 y.o. MRN#: [id number] ?Attending Physician: Kipp Brood, MD ?Primary Care Physician: Pcp, No ?Admit Date: 09/21/2021 ? ?Reason for Consultation/Follow-up: Establishing goals of care ? ?Subjective: ?Patient working with speech therapy and PMV during my visit ?Respiratory also at bedside-patient placed on trach collar ? ?Length of Stay: 29 ? ?Current Medications: ?Scheduled Meds:  ? sodium chloride   Intravenous Once  ? amLODipine  5 mg Per Tube Daily  ? atorvastatin  40 mg Per Tube Daily  ? chlorhexidine gluconate (MEDLINE KIT)  15 mL Mouth Rinse BID  ? Chlorhexidine Gluconate Cloth  6 each Topical Daily  ? famotidine  20 mg Per Tube Daily  ? feeding supplement (PROSource TF)  45 mL Per Tube BID  ? fiber  1 packet Per Tube BID  ? folic acid  1 mg Per Tube Daily  ? free water  100 mL Per Tube Q6H  ? guaiFENesin  10 mL Per Tube Q6H  ? insulin aspart  0-9 Units Subcutaneous Q4H  ? ipratropium-albuterol  3 mL Nebulization Q6H  ? lidocaine  5 mL Intradermal Once  ? mouth rinse  15 mL Mouth Rinse 10 times per day  ? metoprolol tartrate  12.5 mg Per Tube BID  ? multivitamin with minerals  1 tablet Per Tube Daily  ? nicotine  7 mg Transdermal Daily  ? pantoprazole (PROTONIX) IV  40 mg Intravenous Q12H  ? sodium chloride flush  10-40 mL Intracatheter Q12H  ? sodium chloride flush  10-40 mL Intracatheter Q12H  ? thiamine  100 mg Per Tube Daily  ? ? ?Continuous Infusions: ? sodium chloride 10 mL/hr at 10/01/21 1500  ? sodium chloride 10 mL/hr (10/21/21 1031)  ? feeding supplement (VITAL 1.5 CAL) 1,000 mL (10/21/21 0306)  ? ferric gluconate (FERRLECIT) IVPB 135 mL/hr at 10/21/21 1200  ? ? ?PRN Meds: ?Place/Maintain arterial line **AND** sodium  chloride, sodium chloride, albuterol, atropine, docusate, fentaNYL (SUBLIMAZE) injection, hydrALAZINE, lidocaine, oxyCODONE, polyethylene glycol, sodium chloride flush, sodium chloride flush, white petrolatum ? ?Physical Exam ?Constitutional:   ?   General: He is not in acute distress. ?   Appearance: He is ill-appearing.  ?   Comments: Frail, cachectic  ?Pulmonary:  ?   Comments: On trach collar ?Skin: ?   General: Skin is warm and dry.  ?Neurological:  ?   Comments: Difficult to assess  ?         ? ?Vital Signs: BP (!) 146/79   Pulse 78   Temp 100 ?F (37.8 ?C) (Oral)   Resp 19   Ht 5' 7"  (1.702 m)   Wt 53.5 kg   SpO2 98%   BMI 18.47 kg/m?  ?SpO2: SpO2: 98 % ?O2 Device: O2 Device: (S) Ventilator ?O2 Flow Rate: O2 Flow Rate (L/min): 10 L/min ? ?Intake/output summary:  ?Intake/Output Summary (Last 24 hours) at 10/21/2021 1345 ?Last data filed at 10/21/2021 1200 ?Gross per 24 hour  ?Intake 3153.51 ml  ?Output 3481 ml  ?Net -327.49 ml  ? ? ?LBM: Last BM Date : 10/20/21 ?Baseline Weight: Weight: 46.3 kg ?Most recent weight:  Weight: 53.5 kg ? ?    ? ?Flowsheet Rows   ? ?Flowsheet Row Most Recent Value  ?Intake Tab   ?Referral Department Critical care  ?Unit at Time of Referral ICU  ?Palliative Care Primary Diagnosis Sepsis/Infectious Disease  ?Date Notified 10/12/21  ?Palliative Care Type New Palliative care  ?Reason for referral Clarify Goals of Care  ?Date of Admission 09/22/21  ?Date first seen by Palliative Care 10/13/21  ?# of days Palliative referral response time 1 Day(s)  ?# of days IP prior to Palliative referral 20  ?Clinical Assessment   ?Psychosocial & Spiritual Assessment   ?Palliative Care Outcomes   ? ?  ? ? ?Patient Active Problem List  ? Diagnosis Date Noted  ? Status post tracheostomy ([place])   ? Protein-calorie malnutrition, severe 10/01/2021  ? Pressure injury of skin 09/30/2021  ? Pneumonia, pneumococcal ([place]) 09/30/2021  ? Pneumococcal bacteremia 09/30/2021  ? Acute metabolic encephalopathy  00/17/4944  ? Alteration in electrolyte and fluid balance 09/30/2021  ? Hepatitis C 09/30/2021  ? Liver mass 09/30/2021  ? Anemia 09/30/2021  ? NSTEMI (non-ST elevated myocardial infarction) ([place]) 09/30/2021  ? Thrombocytopenia ([place]) 09/30/2021  ? Acute respiratory failure ([place]) 09/22/2021  ? Protein calorie malnutrition ([place]) 09/22/2021  ? Acute respiratory failure with hypoxia ([place])   ? Sepsis ([place])   ? Community acquired pneumonia of left lower lobe of lung 09/21/2021  ? ? ?Palliative Care Assessment & Plan  ? ?HPI: ?66 y.o. male  with past medical history of Duodenal ulcer, anemia, and current smoker admitted on 09/21/2021 with shortness of breath.  Ultimately required intubation due to respiratory failure.  Found to have left lower lobe pneumonia.  Also found to have positive blood cultures.  On March 3 he had an EGD that revealed esophagitis-ongoing anemia.  Per GI no other interventions to offer.  On March 4 patient had tracheostomy placed.  Patient with episodes of bradycardia throughout hospitalization.  Patient also found to have 3 solitary liver masses suspicious for malignancy.  PMT consulted to discuss goals of care. ? ?Assessment: ?Observed patient working with speech therapy and PMV-patient complaining of pain and also difficulty breathing though no physical signs of respiratory distress.  Respiratory therapy at bedside.  Patient reported feeling as though he was breathing better once PMV removed. ? ?Spoke with patient's daughter again today.  We reviewed my assessment of patient today.  We reviewed her conversation with critical care doctor yesterday.  At this time family is interested in continuing current level of care and pursuing PEG tube.  They would like to pursue LTAC or at least see potential option of where he could be placed.  We again review his liver mass and how this contributes to his overall weakness, frailty, cachexia.  She expresses understanding. ? ?We discussed allowing time for her  to work with social work and see what LTAC options there are for patient and we would follow-up middle of next week-she was agreeable to this. ? ?Recommendations/Plan: ?Ongoing conversations with patient and family to determine goals of care ?Family interested in learning more about LTAC options ?Family decided to pursue PEG ?We will check in again mid next week ? ?Code Status: ?Limited code - no CPR ? ?Care plan was discussed with patient and daughter, critical care team ? ?Thank you for allowing the Palliative Medicine Team to assist in the care of this patient. ? ?*Please note that this is a verbal dictation therefore any spelling or grammatical errors are due to  the "[place]" system interpretation. ? ?Juel Burrow, DNP, AGNP-C ?Palliative Medicine Team ?Team Phone # [phone]  ?Pager [phone] ? ?

## 2021-10-21 NOTE — Progress Notes (Signed)
eLink Physician-Brief Progress Note ?Patient Name: Troy Sampson ?DOB: August 17, 1955 ?MRN: [id number] ? ? ?Date of Service ? 10/21/2021  ?HPI/Events of Note ? Anemia - Hgb = 6.9.  ?eICU Interventions ? Will transfuse 1 unit PRBC.  ? ? ? ?Intervention Category ?Major Interventions: Other: ? ?Keya Wynes Dennard Nip ?10/21/2021, 5:14 AM ?

## 2021-10-21 NOTE — Progress Notes (Signed)
Physical Therapy Treatment ?Patient Details ?Name: Troy Sampson ?MRN: [id number] ?DOB: 1956/05/28 ?Today's Date: 10/21/2021 ? ? ?History of Present Illness Pt is a 66 y/o male who presented to the ED 2/22 with SOB. After arrival, pt became obtunded due to severe hypoxia and subsequently intubated emergently. Pt admitted with dx of acute hypoxic/hypercapnic respiratory failure 2nd to community acquired pneumonia.  Further testing revealed Hep C, suspected COPD, and potential underlying malignancy. Pt underwent tracheostomy 3/4. PMHx: duodenal ulcer, IDA, nephrolithiasis, ETOH ? ?  ?PT Comments  ? ? Pt sitting up in bed smiling with cousins in the room. Pt noted to have leakage of Flexiseal and after family left room PT assisted RN in rolling for pericare. After finished encouraged pt to sit EoB given he has not been able to tolerate this week. Pt reluctantly agreeable, however with c/o increased neck pain. Pt with palpable trigger point in L scalene, provided some STM for trigger point release and inquired about possibility of Lidocaine patch. RN to inquire with MD and able to provide pain medication during session. Pt reluctantly agreeable to sit EoB and able to tolerate for approximately 4 min before requesting to return to bed. D/c plans remain appropriate at this time. PT will continue to follow acutely. ? ?   ?Recommendations for follow up therapy are one component of a multi-disciplinary discharge planning process, led by the attending physician.  Recommendations may be updated based on patient status, additional functional criteria and insurance authorization. ? ?Follow Up Recommendations ? Long-term institutional care without follow-up therapy ?  ?  ?Assistance Recommended at Discharge Frequent or constant Supervision/Assistance  ?Patient can return home with the following Two people to help with walking and/or transfers;Two people to help with bathing/dressing/bathroom;Assistance with  cooking/housework;Assistance with feeding;Direct supervision/assist for medications management;Direct supervision/assist for financial management;Assist for transportation;Help with stairs or ramp for entrance;Other (comment) (hoyer lift) ?  ?Equipment Recommendations ? Hospital bed;BSC/3in1;Wheelchair (measurements PT);Wheelchair cushion (measurements PT) (hoyer lift)  ?  ?Recommendations for Other Services   ? ? ?  ?Precautions / Restrictions Precautions ?Precautions: Fall;Other (comment) ?Precaution Comments: vent via trach, NG tube, flexiseal ?Restrictions ?Weight Bearing Restrictions: No  ?  ? ?Mobility ? Bed Mobility ?Overal bed mobility: Needs Assistance ?Bed Mobility: Rolling, Supine to Sit, Sit to Supine ?Rolling: +2 for physical assistance, Total assist ?  ?Supine to sit: Total assist ?Sit to supine: Total assist, +2 for physical assistance, +2 for safety/equipment ?  ?General bed mobility comments: pt with flexiseal leak requring cleaning and linen change, total Ax2 for rolling, with increased encouragement and addressing pt neck pain, reluctantly agrees to sitting EoB. pt able to initiate movement towards EoB but ultimately requires total A to and from EoB, able to sit for approximately 4 min before request to return to supine ?  ? ? ?  ?  ?  ?  ?  ?  ?  ?  ?  ?  ?  ? ? ?  ? ? ?  ?Balance Overall balance assessment: Needs assistance ?Sitting-balance support: Bilateral upper extremity supported, Feet supported ?Sitting balance-Leahy Scale: Poor ?Sitting balance - Comments: able to progress to min A for maintaining seated balance but quickly fatigues ?  ?  ?  ?  ?  ?  ?  ?  ?  ?  ?  ?  ?  ?  ?  ?  ? ?  ?Cognition Arousal/Alertness: Awake/alert ?Behavior During Therapy: Flat affect ?Overall Cognitive Status: Impaired/Different from baseline ?Area of Impairment: Attention,  Following commands, Safety/judgement, Awareness, Problem solving ?  ?  ?  ?  ?  ?  ?  ?  ?  ?Current Attention Level: Sustained ?   ?Following Commands: Follows one step commands with increased time ?Safety/Judgement: Decreased awareness of safety, Decreased awareness of deficits ?Awareness: Intellectual ?Problem Solving: Slow processing, Decreased initiation, Difficulty sequencing, Requires verbal cues, Requires tactile cues ?General Comments: more alert, interacting with his cousins ?  ?  ? ?  ?   ?General Comments General comments (skin integrity, edema, etc.): on full vent support, requiring breath assist when in flattened bed ?  ?  ? ?Pertinent Vitals/Pain Pain Assessment ?Pain Assessment: Faces ?Faces Pain Scale: Hurts whole lot ?Pain Location: neck ?Pain Descriptors / Indicators: Grimacing, Discomfort, Sore ?Pain Intervention(s): Limited activity within patient's tolerance, Monitored during session, Repositioned, RN gave pain meds during session, Other (comment) (recommended Lidocaine patch due to specific location)  ? ? ? ?PT Goals (current goals can now be found in the care plan section) Acute Rehab PT Goals ?PT Goal Formulation: Patient unable to participate in goal setting ?Time For Goal Achievement: 11/01/21 ?Potential to Achieve Goals: Poor ?Progress towards PT goals: Progressing toward goals ? ?  ?Frequency ? ? ? Min 3X/week ? ? ? ?  ?PT Plan Discharge plan needs to be updated  ? ? ?   ?AM-PAC PT "6 Clicks" Mobility   ?Outcome Measure ? Help needed turning from your back to your side while in a flat bed without using bedrails?: Total ?Help needed moving from lying on your back to sitting on the side of a flat bed without using bedrails?: Total ?Help needed moving to and from a bed to a chair (including a wheelchair)?: Total ?Help needed standing up from a chair using your arms (e.g., wheelchair or bedside chair)?: Total ?Help needed to walk in hospital room?: Total ?Help needed climbing 3-5 steps with a railing? : Total ?6 Click Score: 6 ? ?  ?End of Session Equipment Utilized During Treatment: Oxygen (vent via trach) ?Activity  Tolerance: Patient limited by pain;Patient limited by fatigue ?Patient left: with call bell/phone within reach;in bed;with bed alarm set;with nursing/sitter in room ?Nurse Communication: Mobility status ?PT Visit Diagnosis: Other abnormalities of gait and mobility (R26.89);Muscle weakness (generalized) (M62.81) ?  ? ? ?Time: [id number] ?PT Time Calculation (min) (ACUTE ONLY): 41 min ? ?Charges:  $Therapeutic Exercise: 8-22 mins ?$Therapeutic Activity: 23-37 mins          ?          ? ?Delmas Faucett B. Migdalia Dk PT, DPT ?Acute Rehabilitation Services ?Pager [phone] ?Office [phone] ? ? ? ?[place] ?10/21/2021, 2:48 PM ? ?

## 2021-10-21 NOTE — Progress Notes (Signed)
Patient seen today by trach team for consult.  No education is needed at this time.  All necessary equipment is at beside.   Will continue to follow for progression. Patient is still on full ventilatory support. 

## 2021-10-21 NOTE — Consult Note (Signed)
? ?Chief Complaint: ?Patient was seen in consultation today for image guided G tube placement ?Chief Complaint  ?Patient presents with  ? Shortness of Breath  ? at the request of A[place], Einar Grad  ? ?Referring Physician(s): Talmadge Coventry  ? ?Supervising Physician: Aletta Edouard ? ?Patient Status: [facility] - In-pt ? ?History of Present Illness: ?Troy Sampson is a 66 y.o. male with PMHs of duodenal ulcer, IDA, nephrolithiasis and a current smoker who presented from [facility] with c/o SOB x 1 week.  ?Patient was found to be in respiratory failure, intubated and admitted to ICU service on 09/22/21. CXR showed LLL PNA.  ? ?Hospital course was complicated by acute anemia, patient received multiple units of blood during this hospitalization. EGD on 3/3 showed grade D esophagitis with bleeding, patient has been on PPI per GI recommendation.  ?Hospital course further complicated bradycardia requiring atropine and persistent respiratory failure. Patient has not been able to wean off ventilator, underwent tracheostomy on 3/4.  ? ?Palliative consulted, patient's code status has been updated to limited code with no CPR, but the patient and  family members wishes to proceed with aggressive care and wish the patient to be transferred to [facility] once medically stable.  ? ?IR was requested for image guided G tube placement.  ? ?Patient laying in bed, not in acute distress. RT and daughter at the bedside. ?Patient is awake but appears to be lethargic, able to nod and shake head to questions but continues to fall asleep.  ?ROS not performed.  ? ?History reviewed. No pertinent past medical history. ? ?Past Surgical History:  ?Procedure Laterality Date  ? ESOPHAGOGASTRODUODENOSCOPY N/A 10/01/2021  ? Procedure: ESOPHAGOGASTRODUODENOSCOPY (EGD);  Surgeon: Carol Ada, MD;  Location: [place];  Service: Gastroenterology;  Laterality: N/A;  Bedside  ? ? ?Allergies: ?Patient has no known allergies. ? ?Medications: ?Prior to Admission  medications   ?Medication Sig Start Date End Date Taking? Authorizing Provider  ?acetaminophen (TYLENOL) 500 MG tablet Take 1,000 mg by mouth every 6 (six) hours as needed for moderate pain.   Yes [provider]  ?Ferrous Sulfate (IRON PO) Take 1 capsule by mouth daily.   Yes [provider]  ?sodium chloride (OCEAN) 0.65 % SOLN nasal spray Place 1 spray into both nostrils as needed for congestion.   Yes [provider]  ?  ? ?History reviewed. No pertinent family history. ? ?Social History  ? ?Socioeconomic History  ? Marital status: Married  ?  Spouse name: Not on file  ? Number of children: Not on file  ? Years of education: Not on file  ? Highest education level: Not on file  ?Occupational History  ? Not on file  ?Tobacco Use  ? Smoking status: Every Day  ?  Types: Cigarettes  ? Smokeless tobacco: Not on file  ?Substance and Sexual Activity  ? Alcohol use: Yes  ? Drug use: Never  ? Sexual activity: Not on file  ?Other Topics Concern  ? Not on file  ?Social History Narrative  ? Not on file  ? ?Social Determinants of Health  ? ?Financial Resource Strain: Not on file  ?Food Insecurity: Not on file  ?Transportation Needs: Not on file  ?Physical Activity: Not on file  ?Stress: Not on file  ?Social Connections: Not on file  ? ? ? ?Review of Systems: A 12 point ROS discussed and pertinent positives are indicated in the HPI above.  All other systems are negative. ? ?Vital Signs: ?BP (!) 146/79  Pulse 78   Temp 100 ?F (37.8 ?C) (Oral)   Resp 19   Ht 5\' 7"  (1.702 m)   Wt 117 lb 15.1 oz (53.5 kg)   SpO2 96%   BMI 18.47 kg/m?  ? ? ?Physical Exam ?Vitals reviewed.  ?Constitutional:   ?   General: He is not in acute distress. ?   Appearance: He is ill-appearing.  ?   Comments: Cachectic   ?HENT:  ?   Head: Normocephalic and atraumatic.  ?   Mouth/Throat:  ?   Mouth: Mucous membranes are moist.  ?Neck:  ?   Comments: Feeding tube in left nares  ?Cardiovascular:  ?   Rate and Rhythm: Normal  rate and regular rhythm.  ?   Heart sounds: Normal heart sounds.  ?Pulmonary:  ?   Comments: On vent via trach  ?Bronchi on lower lobes  ?Abdominal:  ?   General: Bowel sounds are normal.  ?   Palpations: Abdomen is soft.  ?Skin: ?   General: Skin is warm and dry.  ?   Coloration: Skin is not cyanotic or pale.  ?Neurological:  ?   Comments: Lethargic, able to answer simple questions by nodding head  ?Psychiatric:     ?   Behavior: Behavior is not agitated.  ? ? ?MD Evaluation ?Airway: WNL ?Heart: WNL ?Abdomen: WNL ?Chest/ Lungs: WNL ?ASA  Classification: 3 ?Mallampati/Airway Score: Two ? ?Imaging: ?CT ABDOMEN WO CONTRAST ? ?Result Date: 10/21/2021 ?CLINICAL DATA:  Respiratory failure and assessment abdominal anatomy prior to potential percutaneous gastrostomy tube placement. EXAM: CT ABDOMEN WITHOUT CONTRAST TECHNIQUE: Multidetector CT imaging of the abdomen was performed following the standard protocol without IV contrast. RADIATION DOSE REDUCTION: This exam was performed according to the departmental dose-optimization program which includes automated exposure control, adjustment of the mA and/or kV according to patient size and/or use of iterative reconstruction technique. COMPARISON:  Prior CT of the abdomen on 09/24/2021 FINDINGS: Lower chest: Prominent bilateral lower lobe consolidative pneumonia again noted with associated small bilateral pleural effusions. Hepatobiliary: Lobulated subcapsular tumor of the posterior right lobe of the liver is not well delineated without IV contrast. This again is suspicious for hepatocellular carcinoma given prior CT appearance. The gallbladder is contracted. Pancreas: Unremarkable. No pancreatic ductal dilatation or surrounding inflammatory changes. Spleen: Normal in size without focal abnormality. Adrenals/Urinary Tract: No hydronephrosis. Small bilateral lower pole nonobstructing renal calculi again noted. Stomach/Bowel: Enteric feeding tube extends via the esophagus into  the stomach and traverses the duodenum with the tip located at the duodenal/jejunal junction. Much of the proximal to mid stomach is located behind an enlarged left lobe of the liver. The distal stomach is located inferior to the liver. No evidence of bowel obstruction or significant ileus. No free intraperitoneal air identified. Vascular/Lymphatic: Atherosclerosis of the abdominal aorta without aneurysm. No enlarged lymph nodes identified in the abdomen. Other: There is a trace amount of free fluid along the inferior tip of the liver. No significant ascites otherwise and no other focal fluid collections. No abdominal wall hernia or hiatal hernia. Musculoskeletal: No acute or significant osseous findings. IMPRESSION: 1. Bilateral lower lobe pneumonia with small bilateral pleural effusions. 2. Gastric anatomy should be amenable to attempted percutaneous gastrostomy. 3. Lobulated subcapsular tumor again noted of the posterior right lobe of the liver suspicious for hepatocellular carcinoma. Electronically Signed   By: Aletta Edouard M.D.   On: 10/21/2021 14:12  ? ?DG Abd 1 View ? ?Result Date: 10/02/2021 ?CLINICAL DATA:  Check gastric  catheter placement EXAM: ABDOMEN - 1 VIEW COMPARISON:  10/01/2021 FINDINGS: Feeding catheter is noted in the distal aspect of the stomach. No free air is noted. Chronic changes are noted in the bases bilaterally stable from previous exams. Previously noted some catheter has been withdrawn. IMPRESSION: Feeding catheter in stable position in the distal stomach. Electronically Signed   By: Inez Catalina M.D.   On: 10/02/2021 22:19  ? ?DG Abd 1 View ? ?Result Date: 10/01/2021 ?CLINICAL DATA:  Hypoxia, feeding tube placement. EXAM: PORTABLE CHEST 1 VIEW, Abdomen 1 View COMPARISON:  10/01/2021. FINDINGS: The heart size and mediastinal contours are stable. Diffuse hazy airspace opacities are noted in the lungs bilaterally with consolidation at the left lung base. There is a small left pleural  effusion. A lucency is noted at the left lung apex with extension of lung markings beyond the lucency. The lucency is not seen on abdominal radiograph. An endotracheal tube terminates 3 cm above the carina. A stable

## 2021-10-22 DIAGNOSIS — K922 Gastrointestinal hemorrhage, unspecified: Secondary | ICD-10-CM

## 2021-10-22 LAB — TYPE AND SCREEN
ABO/RH(D): B POS
Antibody Screen: NEGATIVE
Unit division: 0

## 2021-10-22 LAB — BPAM RBC
Blood Product Expiration Date: 202303292359
ISSUE DATE / TIME: 202303230538
Unit Type and Rh: 7300

## 2021-10-22 LAB — MRSA NEXT GEN BY PCR, NASAL: MRSA by PCR Next Gen: NOT DETECTED

## 2021-10-22 LAB — CBC
HCT: 25.8 % — ABNORMAL LOW (ref 39.0–52.0)
Hemoglobin: 8.2 g/dL — ABNORMAL LOW (ref 13.0–17.0)
MCH: 27 pg (ref 26.0–34.0)
MCHC: 31.8 g/dL (ref 30.0–36.0)
MCV: 84.9 fL (ref 80.0–100.0)
Platelets: 259 10*3/uL (ref 150–400)
RBC: 3.04 MIL/uL — ABNORMAL LOW (ref 4.22–5.81)
RDW: 22.2 % — ABNORMAL HIGH (ref 11.5–15.5)
WBC: 22.1 10*3/uL — ABNORMAL HIGH (ref 4.0–10.5)
nRBC: 0.1 % (ref 0.0–0.2)

## 2021-10-22 LAB — GLUCOSE, CAPILLARY
Glucose-Capillary: 85 mg/dL (ref 70–99)
Glucose-Capillary: 87 mg/dL (ref 70–99)
Glucose-Capillary: 74 mg/dL (ref 70–99)
Glucose-Capillary: 81 mg/dL (ref 70–99)
Glucose-Capillary: 117 mg/dL — ABNORMAL HIGH (ref 70–99)

## 2021-10-22 LAB — COMPREHENSIVE METABOLIC PANEL
ALT: 87 U/L — ABNORMAL HIGH (ref 0–44)
AST: 93 U/L — ABNORMAL HIGH (ref 15–41)
Albumin: <1.5 g/dL — ABNORMAL LOW (ref 3.5–5.0)
Alkaline Phosphatase: 197 U/L — ABNORMAL HIGH (ref 38–126)
Anion gap: 4 — ABNORMAL LOW (ref 5–15)
BUN: 27 mg/dL — ABNORMAL HIGH (ref 8–23)
CO2: 30 mmol/L (ref 22–32)
Calcium: 8.6 mg/dL — ABNORMAL LOW (ref 8.9–10.3)
Chloride: 101 mmol/L (ref 98–111)
Creatinine, Ser: 0.35 mg/dL — ABNORMAL LOW (ref 0.61–1.24)
GFR, Estimated: >60 mL/min (ref >60)
Glucose, Bld: 94 mg/dL (ref 70–99)
Potassium: 4.6 mmol/L (ref 3.5–5.1)
Sodium: 135 mmol/L (ref 135–145)
Total Bilirubin: 0.7 mg/dL (ref 0.3–1.2)
Total Protein: 6.9 g/dL (ref 6.5–8.1)

## 2021-10-22 MED ORDER — AMLODIPINE BESYLATE 10 MG PO TABS
10.0000 mg | ORAL_TABLET | Freq: Every day | ORAL | Status: DC
  Administered 2021-10-22 – 2021-10-29 (×7): 10 mg
  Filled 2021-10-22 (×8): qty 1

## 2021-10-22 NOTE — Progress Notes (Signed)
Occupational Therapy Treatment ?Patient Details ?Name: Troy Sampson ?MRN: [id number] ?DOB: [birth year] ?Today's Date: 10/22/2021 ? ? ?History of present illness Pt is a 66 y/o male who presented to the ED 2/22 with SOB. After arrival, pt became obtunded due to severe hypoxia and subsequently intubated emergently. Pt admitted with dx of acute hypoxic/hypercapnic respiratory failure 2nd to community acquired pneumonia.  Further testing revealed Hep C, suspected COPD, and potential underlying malignancy. Pt underwent tracheostomy 3/4. PMHx: duodenal ulcer, IDA, nephrolithiasis, ETOH ?  ?OT comments ? Pt more alert, interactive and with improving UE function/edema today. Pt continues to be limited by quick fatigue with activities and reports some difficulty breathing with PS vent mode though vitals stable. Guided pt in B UE/LE exercises bed level, including precursor activities to improve bed mobility (reaching across to bed rail, bending knee, etc). Emphasis on elevation of BUE, continued AAROM, and breathing techniques. DC recs remain appropriate.  ? ?Recommendations for follow up therapy are one component of a multi-disciplinary discharge planning process, led by the attending physician.  Recommendations may be updated based on patient status, additional functional criteria and insurance authorization. ?   ?Follow Up Recommendations ? OT at Long-term acute care hospital  ?  ?Assistance Recommended at Discharge Frequent or constant Supervision/Assistance  ?Patient can return home with the following ? Two people to help with walking and/or transfers;Two people to help with bathing/dressing/bathroom;Assistance with cooking/housework;Assistance with feeding;Direct supervision/assist for medications management;Direct supervision/assist for financial management;Assist for transportation;Help with stairs or ramp for entrance ?  ?Equipment Recommendations ? Hospital bed;Other (comment) (hoyer lift)  ?  ?Recommendations for Other  Services   ? ?  ?Precautions / Restrictions Precautions ?Precautions: Fall;Other (comment) ?Precaution Comments: vent via trach, NG tube, flexiseal ?Restrictions ?Weight Bearing Restrictions: No  ? ? ?  ? ?   ?   ? ?ADL either performed or assessed with clinical judgement  ? ?ADL Overall ADL's : Needs assistance/impaired ?  ?  ?Grooming: Maximal assistance;Bed level;Wash/dry face ?Grooming Details (indicate cue type and reason): able to wipe mouth once washcloth placed in (L) hand; assist to wipe eyes, forehead ?  ?  ?  ?  ?  ?  ?  ?  ?  ?  ?  ?  ?  ?  ?  ?General ADL Comments: Focus on engagement in basic grooming tasks and improving AAROM exercises though limited by quick fatigue ?  ? ?Extremity/Trunk Assessment Upper Extremity Assessment ?Upper Extremity Assessment: RUE deficits/detail;LUE deficits/detail ?RUE Deficits / Details: much improved wrist edema/ROM; mild edema still noted in hand and more pronounced around elbow. improved grasp (able to move fingers around squeeze ball), elbow/shoulder flexion requires assist. ?RUE Coordination: decreased fine motor;decreased gross motor ?LUE Deficits / Details: no significant edema in this UE; able to reach across body to L UE to grab squeeze ball and utilized. AAROM WFL; shoulders weak ?LUE Coordination: decreased fine motor;decreased gross motor ?  ?Lower Extremity Assessment ?Lower Extremity Assessment: Defer to PT evaluation ?  ?  ?  ? ?Vision   ?Vision Assessment?: No apparent visual deficits ?  ?Perception   ?  ?Praxis   ?  ? ?Cognition Arousal/Alertness: Awake/alert ?Behavior During Therapy: Flat affect ?Overall Cognitive Status: Impaired/Different from baseline ?Area of Impairment: Attention, Following commands, Safety/judgement, Awareness, Problem solving ?  ?  ?  ?  ?  ?  ?  ?  ?  ?Current Attention Level: Selective ?  ?Following Commands: Follows one step commands with increased time ?Safety/Judgement: Decreased  awareness of safety, Decreased awareness of  deficits ?Awareness: Intellectual ?Problem Solving: Slow processing, Decreased initiation, Difficulty sequencing, Requires verbal cues, Requires tactile cues ?General Comments: more alert and interactive initially, follows majority of directions though fatigues quickly ?  ?  ?   ?Exercises Exercises: Other exercises ?Other Exercises ?Other Exercises: composite flexion/extension, wrist flexion/extension; elbow extension/flexion, shoulder flexion/extension, shoulder abduction/adduction; 5 reps each side ?Other Exercises: squeeze ball x 5 B hands ?Other Exercises: reaching across abdomen with B UE x 3-4 reps ?Other Exercises: ankle dorsiflexion/plantarflexion, knee flexion/extension, leg raises x 3-4 reps AAROM ? ?  ?Shoulder Instructions   ? ? ?  ?General Comments Reports feeling "hard to breathe" on vent weaning mode. Pt able to hold to letter board and spell out needs  ? ? ?Pertinent Vitals/ Pain       Pain Assessment ?Pain Assessment: Faces ?Faces Pain Scale: Hurts little more ?Pain Location: throat ?Pain Descriptors / Indicators: Grimacing, Discomfort, Sore ?Pain Intervention(s): Monitored during session, Patient requesting pain meds-RN notified ? ?Home Living   ?  ?  ?  ?  ?  ?  ?  ?  ?  ?  ?  ?  ?  ?  ?  ?  ?  ?  ? ?  ?Prior Functioning/Environment    ?  ?  ?  ?   ? ?Frequency ? Min 2X/week  ? ? ? ? ?  ?Progress Toward Goals ? ?OT Goals(current goals can now be found in the care plan section) ? Progress towards OT goals: OT to reassess next treatment ? ?Acute Rehab OT Goals ?Patient Stated Goal: agreeable to bed level exercises ?OT Goal Formulation: With patient/family ?Time For Goal Achievement: 11/02/21 ?Potential to Achieve Goals: Fair ?ADL Goals ?Pt Will Perform Grooming: with max assist;bed level;sitting ?Pt Will Perform Upper Body Bathing: with max assist;sitting;bed level ?Pt Will Perform Lower Body Bathing: with max assist;sitting/lateral leans ?Pt/caregiver will Perform Home Exercise Program: Increased  ROM;Increased strength;Both right and left upper extremity;With minimal assist;With written HEP provided ?Additional ADL Goal #1: Pt to tolerate sitting EOB > 5 min with no more than Min A to maintain balance during functional tasks  ?Plan Discharge plan remains appropriate   ? ?Co-evaluation ? ? ?   ?  ?  ?  ?  ? ?  ?AM-PAC OT "6 Clicks" Daily Activity     ?Outcome Measure ? ? Help from another person eating meals?: Total ?Help from another person taking care of personal grooming?: A Lot ?Help from another person toileting, which includes using toliet, bedpan, or urinal?: Total ?Help from another person bathing (including washing, rinsing, drying)?: Total ?Help from another person to put on and taking off regular upper body clothing?: Total ?Help from another person to put on and taking off regular lower body clothing?: Total ?6 Click Score: 7 ? ?  ?End of Session Equipment Utilized During Treatment: Other (comment) (vent) ? ?OT Visit Diagnosis: Other abnormalities of gait and mobility (R26.89);Unsteadiness on feet (R26.81);Muscle weakness (generalized) (M62.81);Feeding difficulties (R63.3);Other symptoms and signs involving the nervous system (R29.898);Other symptoms and signs involving cognitive function;Pain ?  ?Activity Tolerance Patient limited by fatigue ?  ?Patient Left in bed;with call bell/phone within reach ?  ?Nurse Communication Mobility status;Patient requests pain meds ?  ? ?   ? ?Time: [id number] ?OT Time Calculation (min): 33 min ? ?Charges: OT General Charges ?$OT Visit: 1 Visit ?OT Treatments ?$Self Care/Home Management : 8-22 mins ?$Therapeutic Exercise: 8-22 mins ? ?Bradd CanaryJulie B, OTR/L ?  Acute Rehab Services ?Office: [phone]  ? ?Lorre Munroe ?10/22/2021, 1:27 PM ?

## 2021-10-22 NOTE — Progress Notes (Signed)
SLP Cancellation Note ? ?Patient Details ?Name: Troy Sampson ?MRN: [id number] ?DOB: 10-Jun-1956 ? ? ?Cancelled treatment:        Pt currently ventilated. Will f/u ? ? ?Atziri Zubiate, Katherene Ponto ?10/22/2021, 9:52 AM ?

## 2021-10-22 NOTE — Progress Notes (Signed)
? ?NAME:  Troy Sampson, MRN:  [id number], DOB:  November 28, 1955, LOS: 30 ?ADMISSION DATE:  09/21/2021, CONSULTATION DATE:  09/22/2021 ?REFERRING MD:  [facility], CHIEF COMPLAINT:  Shortness of Breath  ? ?History of Present Illness:  ?66 y/o male with a PMHx of duodenal ulcer, IDA, nephrolithiasis and a current smoker who presented from [facility] with c/o SOB x 1 week. History obtained by his wife due to patient arrived obtunded, markedly hypoxic oxygen sats in the low 80s. Patient placed on BiPAP and given Ativan. He was fairly tachypnis with RR in 40s as well as tachycardia and was ultimately intubated for acute hypoxic respiratory failure and increased work of breathing. Noted to have a fever of 101.8. CXR show a LLL pneumonia. PCCM consulted for ICU admission.  ? 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 ?Significant Hospital Events: ?Including procedures, antibiotic start and stop dates in addition to other pertinent events   ?2/22: Admitted overnight to ICU. Blood cultures 3/4 positive for strep pneumo. Strep pneumo antigen positive, TTE with EF 50 to 55% no wall motion abnormalities ?2/26: New onset fevers. Antibiotics broadened to Cefepime and Vancomycin. Propofol switched to Precedex.  ?2/27: Bradycardia noted on Precedex with increased tachypnea.  ?2/28, antibiotics changed to penicillin G per infectious disease recommendation ?3/1: Vasoactive drip requirements improving, received 1 unit of blood for hemoglobin of 6.1 ?3/2 follow-up hemoglobin 6.8, received second unit of blood, has also received 3 units of platelets since admission.  Off norepinephrine  ?3/3 EGD> grade D esophagitis with bleeding ?3/4 tracheostomy ?3/7 ongoing tachypnea limiting SBT. Unable to SBT at all this morning. Overnight episode of bradycardia when being turned requiring atropine ?3/15 palliative consulted, made limited code with no CPR ?3/17 ongoing palliative care  discussions ?3/18 Transfused 1 unit PRBC for low hemoglobin with no active bleed ? ?Interim History / Subjective:  ?Patient is awake and alert this morning. States that he is doing well.  ? ?Objective   ?Blood pressure (!) 156/84, pulse 68, temperature 98.4 ?F (36.9 ?C), temperature source Oral, resp. rate 19, height 5\' 7"  (1.702 m), weight 54.6 kg, SpO2 98 %. ?   ?Vent Mode: PRVC ?FiO2 (%):  [40 %-60 %] 40 % ?Set Rate:  [18 bmp] 18 bmp ?Vt Set:  [500 mL] 500 mL ?PEEP:  [5 cmH20] 5 cmH20 ?Pressure Support:  [5 cmH20-8 cmH20] 8 cmH20  ? ?Intake/Output Summary (Last 24 hours) at 10/22/2021 10/24/2021 ?Last data filed at 10/22/2021 0400 ?Gross per 24 hour  ?Intake 1748.94 ml  ?Output 4240 ml  ?Net -2491.06 ml  ? ? ?Filed Weights  ? 10/20/21 0500 10/21/21 0100 10/22/21 0454  ?Weight: 53.3 kg 53.5 kg 54.6 kg  ? ?Physical exam: ?Gen:   Alert and awake. In no acute distress ?HEENT:  EOMI, sclera anicteric ?Neck:     Trach in place. Currently on pressure support ?Lungs:    Normal work of breathing.  Rhonchi heard bilaterally ?CV:         Regular rate and regular rhythm, no murmur ?Abd:     Melenotic stool seen  ?Ext:    2-3+ edema over upper extremity ?Skin:      Warm and dry; no rash ? ?Resolved Hospital Problem list   ?Septic shock ?Lactic acidosis ?Acute urinary retention ?Thrombocytopenia ?Type 2 MI ?Urinary retention ?Aspiration pneumonia ?Right knee pseudogout ? 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  Titrate up slowly to avoid fatigue.  He will need a long period of time to be able to wean off ventilator if he can be weaned off.   ?- Continue lung protective ventilation ?- VAP prevention place ?- Routine trach care and bronchial hygiene ? ?Acute on chronic blood loss anemia ?Upper GI bleeding from esophagitis, grade D ?Iron deficiency anemia s/p IV iron supplement ?GERD ?Hemoglobin stable.  Persistent melena seen in rectal tube ?- IR consulted for PEG placement. Plan for next week  ?- Hold heparin for DVT proph until hemoglobin stable ?- Continue PPI and Pepcid for his GERD ? ?Hypertension ?- Continue Lopressor 12.5 mg BID  ?- Increase Amlodipine to 10 mg  ? 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 No other metastatic disease. Not a candidate for biopsy given nutrition status. ?LFT trending down ?Avoid tylenol  ? ?Hepatitis C infection  ?Suspect this is chronic given last IVDA occurred over 30 years ago. Viral load elevated at 27, 200,000.  ?- Outpatient treatment  ? ?Severe protein calorie malnutrition, cachexia ?Continue TF through cortrak ?Continue free water flushes.  ?Consult IR for PEG tube ? ?HLDX ?cont statin ? ?Tobacco dependence ?Continue nicotine patch ?Smoking cessation counseling provided ? ?Best Practice (right click and "Reselect all SmartList Selections" daily)  ? ?Diet/type: tubefeeds ?DVT prophylaxis: SCD.  DVT prophylaxis until hemoglobin stable ?GI prophylaxis: PPI, H2 blocker ?Lines: N/A ?Foley:  N/A ?Code Status:  Partial. No CPR ?Last date of multidisciplinary goals of care discussion: Will update family ? ?Critical care time:  ? ?Doran Stabler, DO ? ?

## 2021-10-23 LAB — GLUCOSE, CAPILLARY
Glucose-Capillary: 112 mg/dL — ABNORMAL HIGH (ref 70–99)
Glucose-Capillary: 118 mg/dL — ABNORMAL HIGH (ref 70–99)
Glucose-Capillary: 119 mg/dL — ABNORMAL HIGH (ref 70–99)
Glucose-Capillary: 119 mg/dL — ABNORMAL HIGH (ref 70–99)
Glucose-Capillary: 130 mg/dL — ABNORMAL HIGH (ref 70–99)
Glucose-Capillary: 135 mg/dL — ABNORMAL HIGH (ref 70–99)
Glucose-Capillary: 141 mg/dL — ABNORMAL HIGH (ref 70–99)

## 2021-10-23 LAB — CBC
HCT: 23.2 % — ABNORMAL LOW (ref 39.0–52.0)
Hemoglobin: 7.6 g/dL — ABNORMAL LOW (ref 13.0–17.0)
MCH: 27.9 pg (ref 26.0–34.0)
MCHC: 32.8 g/dL (ref 30.0–36.0)
MCV: 85.3 fL (ref 80.0–100.0)
Platelets: 246 10*3/uL (ref 150–400)
RBC: 2.72 MIL/uL — ABNORMAL LOW (ref 4.22–5.81)
RDW: 21.1 % — ABNORMAL HIGH (ref 11.5–15.5)
WBC: 20.3 10*3/uL — ABNORMAL HIGH (ref 4.0–10.5)
nRBC: 0.1 % (ref 0.0–0.2)

## 2021-10-23 MED ORDER — FAMOTIDINE IN NACL 20-0.9 MG/50ML-% IV SOLN
20.0000 mg | INTRAVENOUS | Status: DC
Start: 2021-10-23 — End: 2021-10-25
  Administered 2021-10-23 – 2021-10-25 (×3): 20 mg via INTRAVENOUS
  Filled 2021-10-23 (×3): qty 50

## 2021-10-23 MED ORDER — ARFORMOTEROL TARTRATE 15 MCG/2ML IN NEBU
15.0000 ug | INHALATION_SOLUTION | Freq: Two times a day (BID) | RESPIRATORY_TRACT | Status: DC
  Administered 2021-10-23 – 2021-10-29 (×14): 15 ug via RESPIRATORY_TRACT
  Filled 2021-10-23 (×14): qty 2

## 2021-10-23 MED ORDER — FUROSEMIDE 10 MG/ML IJ SOLN
40.0000 mg | Freq: Once | INTRAMUSCULAR | Status: AC
Start: 2021-10-23 — End: 2021-10-23
  Administered 2021-10-23: 40 mg via INTRAVENOUS
  Filled 2021-10-23: qty 4

## 2021-10-23 MED ORDER — REVEFENACIN 175 MCG/3ML IN SOLN
175.0000 ug | Freq: Every day | RESPIRATORY_TRACT | Status: DC
  Administered 2021-10-24 – 2021-10-29 (×6): 175 ug via RESPIRATORY_TRACT
  Filled 2021-10-23 (×7): qty 3

## 2021-10-23 MED ORDER — CEFAZOLIN SODIUM-DEXTROSE 2-4 GM/100ML-% IV SOLN
2.0000 g | INTRAVENOUS | Status: AC
  Administered 2021-10-25: 2 g via INTRAVENOUS

## 2021-10-23 MED ORDER — GUAIFENESIN 100 MG/5ML PO LIQD
10.0000 mL | Freq: Four times a day (QID) | ORAL | Status: DC | PRN
  Administered 2021-10-24 – 2021-10-27 (×3): 10 mL
  Filled 2021-10-23 (×3): qty 10

## 2021-10-23 MED ORDER — BUDESONIDE 0.25 MG/2ML IN SUSP
0.2500 mg | Freq: Two times a day (BID) | RESPIRATORY_TRACT | Status: DC
  Administered 2021-10-23 – 2021-10-29 (×14): 0.25 mg via RESPIRATORY_TRACT
  Filled 2021-10-23 (×14): qty 2

## 2021-10-23 NOTE — Progress Notes (Signed)
? ?NAME:  Troy Sampson, MRN:  [id number], DOB:  1956-02-29, LOS: 52 ?ADMISSION DATE:  09/21/2021, CONSULTATION DATE:  09/22/2021 ?REFERRING MD:  [facility], CHIEF COMPLAINT:  Shortness of Breath  ? ?History of Present Illness:  ?66 y/o male with a PMHx of duodenal ulcer, IDA, nephrolithiasis and a current smoker who presented from [facility] with c/o SOB x 1 week. History obtained by his wife due to patient arrived obtunded, markedly hypoxic oxygen sats in the low 80s. Patient placed on BiPAP and given Ativan. He was fairly tachypnis with RR in 43s as well as tachycardia and was ultimately intubated for acute hypoxic respiratory failure and increased work of breathing. Noted to have a fever of 101.8. CXR show a LLL pneumonia. PCCM consulted for ICU admission.  ? 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 ?Significant Hospital Events: ?Including procedures, antibiotic start and stop dates in addition to other pertinent events   ?2/22: Admitted overnight to ICU. Blood cultures 3/4 positive for strep pneumo. Strep pneumo antigen positive, TTE with EF 50 to 55% no wall motion abnormalities ?2/26: New onset fevers. Antibiotics broadened to Cefepime and Vancomycin. Propofol switched to Precedex.  ?2/27: Bradycardia noted on Precedex with increased tachypnea.  ?2/28, antibiotics changed to penicillin G per infectious disease recommendation ?3/1: Vasoactive drip requirements improving, received 1 unit of blood for hemoglobin of 6.1 ?3/2 follow-up hemoglobin 6.8, received second unit of blood, has also received 3 units of platelets since admission.  Off norepinephrine  ?3/3 EGD> grade D esophagitis with bleeding ?3/4 tracheostomy ?3/7 ongoing tachypnea limiting SBT. Unable to SBT at all this morning. Overnight episode of bradycardia when being turned requiring atropine ?3/15 palliative consulted, made limited code with no CPR ?3/17 ongoing palliative care  discussions ?3/18 Transfused 1 unit PRBC for low hemoglobin with no active bleed ?3/24 IR consult for PEG ? ?Interim History / Subjective:  ?Pressure support 10/5 this AM. ? ?Objective   ?Blood pressure (!) 163/78, pulse 95, temperature 99.2 ?F (37.3 ?C), temperature source Oral, resp. rate (!) 24, height 5\' 7"  (1.702 m), weight 52.4 kg, SpO2 97 %. ?   ?Vent Mode: PSV;CPAP ?FiO2 (%):  [40 %] 40 % ?Set Rate:  [18 bmp] 18 bmp ?Vt Set:  [500 mL] 500 mL ?PEEP:  [5 cmH20] 5 cmH20 ?Pressure Support:  [10 cmH20] 10 cmH20 ?Plateau Pressure:  [18 cmH20-22 cmH20] 22 cmH20  ? ?Intake/Output Summary (Last 24 hours) at 10/23/2021 0903 ?Last data filed at 10/23/2021 0800 ?Gross per 24 hour  ?Intake 1484.7 ml  ?Output 2115 ml  ?Net -630.3 ml  ? ?Filed Weights  ? 10/21/21 0100 10/22/21 0454 10/23/21 0500  ?Weight: 53.5 kg 54.6 kg 52.4 kg  ? ?Physical exam: ? ?General - alert ?Eyes - pupils reactive ?ENT - trach site clean ?Cardiac - regular rate/rhythm, no murmur ?Chest - scattered rhonchi ?Abdomen - soft, non tender, + bowel sounds ?Extremities - decreased muscle bulk ?Skin - no rashes ?Neuro - follows commands ? ?Resolved Hospital Problem list   ?Septic shock, Lactic acidosis, Thrombocytopenia, Type 2 MI, Acute urinary retention, Aspiration pneumonia, Right knee pseudogout ? ?Assessment & Plan:  ? ?Acute hypoxic/hypercapnic respiratory failure. ?Failure to wean s/p tracheostomy. ?Presumed COPD. ?- pressure support wean to TC as able ?- trach care ?- goal SpO2 > 92% ?- f/u CXR intermittently ?- d/c nicotine patch ?- add pulmicort, brovana, yupelri ?- prn albuterol ? ?ABLA from Upper GI bleeding  with esophagitis. ?Anemia of critical illness, iron deficiency and chronic disease. ?- f/u CBC ?- transfuse for Hb < 7 ?- continue protonix and pepcid >> continue IV until PEG placed ? ?HTN, HLD. ?- goals BP normotensive ?- continue norvasc, lipitor, lopressor ?- lasix 40 mg IV x one on 3/25 ? ?Liver mass, Hepatitis C. ?- Three solitary liver  masses suspicious for hepatocellular carcinoma. No other metastatic disease. Not a candidate for biopsy given nutrition status. ?- will need outpt assessment when medically stable ? ?Severe protein calorie malnutrition, cachexia ?- IR consulted for PEG placement ?- resume tube feeds after PEG placement ? ?Goals of care. ?- palliative care consulted ? ?Best Practice (right click and "Reselect all SmartList Selections" daily)  ? ?Diet/type: tubefeeds ?DVT prophylaxis: SCD.  DVT prophylaxis until hemoglobin stable ?GI prophylaxis: PPI, H2 blocker ?Lines: N/A ?Foley:  N/A ?Code Status:  Partial. No CPR ?Last date of multidisciplinary goals of care discussion: Will update family ? ?Labs:  ? ? ?  Latest Ref Rng & Units 10/22/2021  ?  5:00 AM 10/21/2021  ?  3:49 AM 10/19/2021  ?  2:53 AM  ?CMP  ?Glucose 70 - 99 mg/dL 94   151   116    ?BUN 8 - 23 mg/dL 27   25   29     ?Creatinine 0.61 - 1.24 mg/dL 0.35   0.33   0.39    ?Sodium 135 - 145 mmol/L 135   140   143    ?Potassium 3.5 - 5.1 mmol/L 4.6   4.9   4.4    ?Chloride 98 - 111 mmol/L 101   106   106    ?CO2 22 - 32 mmol/L 30   31   32    ?Calcium 8.9 - 10.3 mg/dL 8.6   8.4   7.9    ?Total Protein 6.5 - 8.1 g/dL 6.9    5.9    ?Total Bilirubin 0.3 - 1.2 mg/dL 0.7    0.6    ?Alkaline Phos 38 - 126 U/L 197    174    ?AST 15 - 41 U/L 93    72    ?ALT 0 - 44 U/L 87    63    ? ? ? ?  Latest Ref Rng & Units 10/23/2021  ?  4:19 AM 10/22/2021  ?  5:00 AM 10/21/2021  ? 10:47 AM  ?CBC  ?WBC 4.0 - 10.5 K/uL 20.3   22.1     ?Hemoglobin 13.0 - 17.0 g/dL 7.6   8.2   7.9    ?Hematocrit 39.0 - 52.0 % 23.2   25.8   24.7    ?Platelets 150 - 400 K/uL 246   259     ? ? ?ABG ?   ?Component Value Date/Time  ? PHART 7.498 (H) 10/09/2021 0106  ? PCO2ART 43.4 10/09/2021 0106  ? PO2ART 64 (L) 10/09/2021 0106  ? HCO3 33.8 (H) 10/09/2021 0106  ? TCO2 35 (H) 10/09/2021 0106  ? ACIDBASEDEF 1.0 09/28/2021 0405  ? O2SAT 94 10/09/2021 0106  ? ? ?CBG (last 3)  ?Recent Labs  ?  10/22/21 ?2359 10/23/21 ?[id number]  10/23/21 ?[id number]  ?GLUCAP 141* 130* 119*  ? ? ?Signature:  ?Chesley Mires, MD ?[place] ?Pager - [phone] ?10/23/2021, 9:12 AM ? ? ?

## 2021-10-23 NOTE — Progress Notes (Signed)
Patient tolerating ATC well at this time with no complications noted. Patient looks and was resting comfortably. Patient stated his breathing was fine and he would rather stay on ATC. RT informed patient to notify staff if any changes with his breathing occurred.  ?

## 2021-10-24 ENCOUNTER — Inpatient Hospital Stay (HOSPITAL_COMMUNITY): Payer: Medicare Other

## 2021-10-24 LAB — GLUCOSE, CAPILLARY
Glucose-Capillary: 85 mg/dL (ref 70–99)
Glucose-Capillary: 114 mg/dL — ABNORMAL HIGH (ref 70–99)
Glucose-Capillary: 142 mg/dL — ABNORMAL HIGH (ref 70–99)
Glucose-Capillary: 110 mg/dL — ABNORMAL HIGH (ref 70–99)
Glucose-Capillary: 104 mg/dL — ABNORMAL HIGH (ref 70–99)

## 2021-10-24 LAB — CBC
HCT: 27.4 % — ABNORMAL LOW (ref 39.0–52.0)
Hemoglobin: 8.6 g/dL — ABNORMAL LOW (ref 13.0–17.0)
MCH: 27.1 pg (ref 26.0–34.0)
MCHC: 31.4 g/dL (ref 30.0–36.0)
MCV: 86.4 fL (ref 80.0–100.0)
Platelets: 280 10*3/uL (ref 150–400)
RBC: 3.17 MIL/uL — ABNORMAL LOW (ref 4.22–5.81)
RDW: 20.1 % — ABNORMAL HIGH (ref 11.5–15.5)
WBC: 22.4 10*3/uL — ABNORMAL HIGH (ref 4.0–10.5)
nRBC: 0 % (ref 0.0–0.2)

## 2021-10-24 LAB — CULTURE, RESPIRATORY W GRAM STAIN: Culture: NORMAL

## 2021-10-24 LAB — MAGNESIUM: Magnesium: 1.6 mg/dL — ABNORMAL LOW (ref 1.7–2.4)

## 2021-10-24 LAB — BASIC METABOLIC PANEL
Anion gap: 4 — ABNORMAL LOW (ref 5–15)
BUN: 23 mg/dL (ref 8–23)
CO2: 29 mmol/L (ref 22–32)
Calcium: 8.6 mg/dL — ABNORMAL LOW (ref 8.9–10.3)
Chloride: 100 mmol/L (ref 98–111)
Creatinine, Ser: 0.32 mg/dL — ABNORMAL LOW (ref 0.61–1.24)
GFR, Estimated: >60 mL/min (ref >60)
Glucose, Bld: 92 mg/dL (ref 70–99)
Potassium: 4.7 mmol/L (ref 3.5–5.1)
Sodium: 133 mmol/L — ABNORMAL LOW (ref 135–145)

## 2021-10-24 MED ORDER — MAGNESIUM SULFATE 4 GM/100ML IV SOLN
4.0000 g | Freq: Once | INTRAVENOUS | Status: AC
  Administered 2021-10-24: 4 g via INTRAVENOUS
  Filled 2021-10-24: qty 100

## 2021-10-24 MED ORDER — DEXTROSE 10 % IV SOLN: INTRAVENOUS | Status: DC

## 2021-10-24 MED ORDER — FREE WATER: 100.0000 mL | Status: DC

## 2021-10-24 MED ORDER — CARVEDILOL 6.25 MG PO TABS
6.2500 mg | ORAL_TABLET | Freq: Two times a day (BID) | ORAL | Status: DC
Start: 2021-10-24 — End: 2021-10-25
  Administered 2021-10-24 (×2): 6.25 mg via ORAL
  Filled 2021-10-24 (×3): qty 1

## 2021-10-24 NOTE — Progress Notes (Signed)
[facility] ADULT ICU REPLACEMENT PROTOCOL ? ? ?The patient does apply for the [facility] Adult ICU Electrolyte Replacment Protocol based on the criteria listed below:  ? ?1.Exclusion criteria: TCTS patients, ECMO patients, and Dialysis patients ?2. Is GFR >/= 30 ml/min? Yes.    ?Patient's GFR today is >60 ?3. Is SCr </= 2? Yes.   ?Patient's SCr is 0.32 mg/dL ?4. Did SCr increase >/= 0.5 in 24 hours? No. ?5.Pt's weight >40kg  Yes.   ?6. Abnormal electrolyte(s): Mag 1.6  ?7. Electrolytes replaced per protocol ?8.  Call MD STAT for K+ </= 2.5, Phos </= 1, or Mag </= 1 ?Physician:  Arsenio Loader ? ?Lolita Lenz 10/24/2021 4:41 AM  ?

## 2021-10-24 NOTE — Progress Notes (Signed)
eLink Physician-Brief Progress Note ?Patient Name: Troy Sampson ?DOB: 27-Jul-1956 ?MRN: [id number] ? ? ?Date of Service ? 10/24/2021  ?HPI/Events of Note ? Patient for PEG placement tomorrow. Tube feeds will be stopped at midnight.   ?eICU Interventions ? Plan: ?D10W IV infusion at 40 mL/hour to start at 12 midnight.   ? ? ? ?Intervention Category ?Major Interventions: Other: ? ?Zabrina Brotherton Cornelia Copa ?10/24/2021, 10:39 PM ?

## 2021-10-24 NOTE — Progress Notes (Signed)
? ?NAME:  Troy Sampson, MRN:  [id number], DOB:  1955-11-26, LOS: 55 ?ADMISSION DATE:  09/21/2021, CONSULTATION DATE:  09/22/2021 ?REFERRING MD:  [facility], CHIEF COMPLAINT:  Shortness of Breath  ? ?History of Present Illness:  ?66 y/o male with a PMHx of duodenal ulcer, IDA, nephrolithiasis and a current smoker who presented from [facility] with c/o SOB x 1 week. History obtained by his wife due to patient arrived obtunded, markedly hypoxic oxygen sats in the low 80s. Patient placed on BiPAP and given Ativan. He was fairly tachypnis with RR in 38s as well as tachycardia and was ultimately intubated for acute hypoxic respiratory failure and increased work of breathing. Noted to have a fever of 101.8. CXR show a LLL pneumonia. PCCM consulted for ICU admission.  ? 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 ?Significant Hospital Events: ?Including procedures, antibiotic start and stop dates in addition to other pertinent events   ?2/22: Admitted overnight to ICU. Blood cultures 3/4 positive for strep pneumo. Strep pneumo antigen positive, TTE with EF 50 to 55% no wall motion abnormalities ?2/26: New onset fevers. Antibiotics broadened to Cefepime and Vancomycin. Propofol switched to Precedex.  ?2/27: Bradycardia noted on Precedex with increased tachypnea.  ?2/28, antibiotics changed to penicillin G per infectious disease recommendation ?3/1: Vasoactive drip requirements improving, received 1 unit of blood for hemoglobin of 6.1 ?3/2 follow-up hemoglobin 6.8, received second unit of blood, has also received 3 units of platelets since admission.  Off norepinephrine  ?3/3 EGD> grade D esophagitis with bleeding ?3/4 tracheostomy ?3/7 ongoing tachypnea limiting SBT. Unable to SBT at all this morning. Overnight episode of bradycardia when being turned requiring atropine ?3/15 palliative consulted, made limited code with no CPR ?3/17 ongoing palliative care  discussions ?3/18 Transfused 1 unit PRBC for low hemoglobin with no active bleed ?3/24 IR consult for PEG ? ?Interim History / Subjective:  ?Patient is awake and alert this morning.  Reports pain in his throat.  Said the pain in his right knee is resolved.  No other issue. ? ?Objective   ?Blood pressure (!) 150/74, pulse 76, temperature 98.8 ?F (37.1 ?C), temperature source Oral, resp. rate (!) 21, height 5\' 7"  (1.702 m), weight 46.8 kg, SpO2 99 %. ?   ?Vent Mode: PRVC ?FiO2 (%):  [35 %-40 %] 40 % ?Set Rate:  [18 bmp] 18 bmp ?Vt Set:  [500 mL] 500 mL ?PEEP:  [5 cmH20] 5 cmH20 ?Pressure Support:  [10 cmH20] 10 cmH20 ?Plateau Pressure:  [27 [id number] cmH20] 27 cmH20  ? ?Intake/Output Summary (Last 24 hours) at 10/24/2021 [id number] ?Last data filed at 10/24/2021 0500 ?Gross per 24 hour  ?Intake 890 ml  ?Output 2555 ml  ?Net -1665 ml  ? ? ?Filed Weights  ? 10/22/21 0454 10/23/21 0500 10/24/21 0500  ?Weight: 54.6 kg 52.4 kg 46.8 kg  ? ?Physical exam: ? ?General - alert, awake and oriented ?Eyes - pupils reactive ?ENT - trach site clean.  NG tube in place ?Cardiac - regular rate/rhythm, no murmur ?Chest -clear sounds anterior lungs ?Abdomen - soft, non tender, + bowel sounds ?Extremities - decreased muscle bulk.  No edema. ?Skin - no rashes ?Neuro - follows commands ? ?Resolved Hospital Problem list   ?Septic shock, Lactic acidosis, Thrombocytopenia, Type 2 MI, Acute urinary retention, Aspiration pneumonia, Right knee pseudogout ? ?Assessment & Plan:  ?Acute hypoxic/hypercapnic respiratory failure. ?Failure to wean s/p tracheostomy. ?Presumed COPD. ?- pressure support  wean to TC as able.  We will try 1.5-2 hours section BID today.  Titrate up 30 minutes every to 3 days. ?- trach care ?- goal SpO2 > 92% ?- f/u CXR intermittently ?- continue pulmicort, brovana, yupelri ?- prn albuterol ? ?ABLA from Upper GI bleeding with esophagitis. ?Anemia of critical illness, iron deficiency and chronic disease. ?- f/u CBC ?- transfuse for Hb <  7 ?- continue protonix and pepcid >> continue IV until PEG placed ?- Currently holding DVT prophylaxis.  We will hold today for possible PEG placement tomorrow. ? ?HTN, HLD. ?- goals BP normotensive ?- continue norvasc, lipitor ?- Change to Coreg 6.25 mg BID for better BO control  ? ?Liver mass, Hepatitis C. ?- Three solitary liver masses suspicious for hepatocellular carcinoma. No other metastatic disease. Not a candidate for biopsy given nutrition status. ?- will need outpt assessment when medically stable ? ?Severe protein calorie malnutrition, cachexia ?- IR consulted for PEG placement ?- resume tube feeds after PEG placement ? ?Goals of care. ?- palliative care consulted ? ?Best Practice (right click and "Reselect all SmartList Selections" daily)  ? ?Diet/type: tubefeeds ?DVT prophylaxis: SCD.  Hold DVT prophylaxis for PEG placement tomorrow ?GI prophylaxis: PPI, H2 blocker ?Lines: N/A ?Foley:  N/A ?Code Status:  Partial. No CPR ?Last date of multidisciplinary goals of care discussion: Will update family ? ?Labs:  ? ? ?  Latest Ref Rng & Units 10/24/2021  ?  3:12 AM 10/22/2021  ?  5:00 AM 10/21/2021  ?  3:49 AM  ?CMP  ?Glucose 70 - 99 mg/dL 92   94   151    ?BUN 8 - 23 mg/dL 23   27   25     ?Creatinine 0.61 - 1.24 mg/dL 0.32   0.35   0.33    ?Sodium 135 - 145 mmol/L 133  C 135   140    ?Potassium 3.5 - 5.1 mmol/L 4.7   4.6   4.9    ?Chloride 98 - 111 mmol/L 100  C 101   106    ?CO2 22 - 32 mmol/L 29  C 30   31    ?Calcium 8.9 - 10.3 mg/dL 8.6   8.6   8.4    ?Total Protein 6.5 - 8.1 g/dL  6.9     ?Total Bilirubin 0.3 - 1.2 mg/dL  0.7     ?Alkaline Phos 38 - 126 U/L  197     ?AST 15 - 41 U/L  93     ?ALT 0 - 44 U/L  87     ?  ?C Corrected result  ? ? ? ? ?  Latest Ref Rng & Units 10/24/2021  ?  3:12 AM 10/23/2021  ?  4:19 AM 10/22/2021  ?  5:00 AM  ?CBC  ?WBC 4.0 - 10.5 K/uL 22.4   20.3   22.1    ?Hemoglobin 13.0 - 17.0 g/dL 8.6   7.6   8.2    ?Hematocrit 39.0 - 52.0 % 27.4   23.2   25.8    ?Platelets 150 - 400 K/uL  280   246   259    ? ? ?ABG ?   ?Component Value Date/Time  ? PHART 7.498 (H) 10/09/2021 0106  ? PCO2ART 43.4 10/09/2021 0106  ? PO2ART 64 (L) 10/09/2021 0106  ? HCO3 33.8 (H) 10/09/2021 0106  ? TCO2 35 (H) 10/09/2021 0106  ? ACIDBASEDEF 1.0 09/28/2021 0405  ? O2SAT 94 10/09/2021 0106  ? ? ?  CBG (last 3)  ?Recent Labs  ?  10/23/21 ?1956 10/23/21 ?2326 10/24/21 ?0308  ?GLUCAP 118* 119* 85  ? ? ? ?Signature:  ? ?Gaylan Gerold, DO ? ? ?

## 2021-10-25 ENCOUNTER — Inpatient Hospital Stay (HOSPITAL_COMMUNITY): Payer: Medicare Other

## 2021-10-25 DIAGNOSIS — J90 Pleural effusion, not elsewhere classified: Secondary | ICD-10-CM

## 2021-10-25 HISTORY — PX: IR GASTROSTOMY TUBE MOD SED: IMG625

## 2021-10-25 LAB — GLUCOSE, CAPILLARY
Glucose-Capillary: 102 mg/dL — ABNORMAL HIGH (ref 70–99)
Glucose-Capillary: 103 mg/dL — ABNORMAL HIGH (ref 70–99)
Glucose-Capillary: 120 mg/dL — ABNORMAL HIGH (ref 70–99)
Glucose-Capillary: 91 mg/dL (ref 70–99)
Glucose-Capillary: 94 mg/dL (ref 70–99)
Glucose-Capillary: 96 mg/dL (ref 70–99)
Glucose-Capillary: 99 mg/dL (ref 70–99)

## 2021-10-25 MED ORDER — MIDAZOLAM HCL 2 MG/2ML IJ SOLN
INTRAMUSCULAR | Status: AC
  Filled 2021-10-25: qty 2

## 2021-10-25 MED ORDER — LIDOCAINE HCL 1 % IJ SOLN
INTRAMUSCULAR | Status: AC
  Filled 2021-10-25: qty 20

## 2021-10-25 MED ORDER — FENTANYL CITRATE (PF) 100 MCG/2ML IJ SOLN
INTRAMUSCULAR | Status: AC
  Filled 2021-10-25: qty 2

## 2021-10-25 MED ORDER — GLUCAGON HCL RDNA (DIAGNOSTIC) 1 MG IJ SOLR
INTRAMUSCULAR | Status: AC
  Filled 2021-10-25: qty 1

## 2021-10-25 MED ORDER — CEFAZOLIN SODIUM-DEXTROSE 2-4 GM/100ML-% IV SOLN
INTRAVENOUS | Status: AC
Start: 2021-10-25 — End: 2021-10-25
  Filled 2021-10-25: qty 100

## 2021-10-25 MED ORDER — FENTANYL CITRATE (PF) 100 MCG/2ML IJ SOLN
INTRAMUSCULAR | Status: AC | PRN
  Administered 2021-10-25: 25 ug via INTRAVENOUS

## 2021-10-25 MED ORDER — MAGNESIUM SULFATE 2 GM/50ML IV SOLN
2.0000 g | Freq: Once | INTRAVENOUS | Status: AC
  Administered 2021-10-25: 2 g via INTRAVENOUS
  Filled 2021-10-25: qty 50

## 2021-10-25 MED ORDER — ENOXAPARIN SODIUM 30 MG/0.3ML IJ SOSY
30.0000 mg | PREFILLED_SYRINGE | INTRAMUSCULAR | Status: DC
  Administered 2021-10-25 – 2021-10-29 (×5): 30 mg via SUBCUTANEOUS
  Filled 2021-10-25 (×5): qty 0.3

## 2021-10-25 MED ORDER — IOHEXOL 300 MG/ML  SOLN
100.0000 mL | Freq: Once | INTRAMUSCULAR | Status: AC | PRN
  Administered 2021-10-25: 15 mL via INTRATHECAL

## 2021-10-25 MED ORDER — MAGNESIUM SULFATE 50 % IJ SOLN: 2.0000 g | Freq: Once | INTRAMUSCULAR | Status: DC

## 2021-10-25 MED ORDER — GLUCAGON HCL (RDNA) 1 MG IJ SOLR
INTRAMUSCULAR | Status: AC | PRN
Start: 2021-10-25 — End: 2021-10-25
  Administered 2021-10-25: .5 mg via INTRAVENOUS

## 2021-10-25 MED ORDER — PANTOPRAZOLE 2 MG/ML SUSPENSION
40.0000 mg | Freq: Two times a day (BID) | ORAL | Status: DC
  Administered 2021-10-25 – 2021-10-29 (×9): 40 mg
  Filled 2021-10-25 (×9): qty 20

## 2021-10-25 MED ORDER — HYDROMORPHONE HCL 1 MG/ML IJ SOLN
0.5000 mg | INTRAMUSCULAR | Status: DC | PRN
  Administered 2021-10-25 – 2021-10-29 (×20): 0.5 mg via INTRAVENOUS
  Filled 2021-10-25 (×20): qty 0.5

## 2021-10-25 MED ORDER — LIDOCAINE VISCOUS HCL 2 % MT SOLN
OROMUCOSAL | Status: AC
  Filled 2021-10-25: qty 15

## 2021-10-25 MED ORDER — CARVEDILOL 6.25 MG PO TABS: 6.2500 mg | ORAL_TABLET | Freq: Two times a day (BID) | ORAL | Status: DC

## 2021-10-25 MED ORDER — MIDAZOLAM HCL 2 MG/2ML IJ SOLN
INTRAMUSCULAR | Status: AC | PRN
  Administered 2021-10-25: 1 mg via INTRAVENOUS

## 2021-10-25 MED ORDER — FAMOTIDINE 20 MG PO TABS
20.0000 mg | ORAL_TABLET | Freq: Every day | ORAL | Status: DC
  Administered 2021-10-26 – 2021-10-29 (×4): 20 mg
  Filled 2021-10-25 (×4): qty 1

## 2021-10-25 MED ORDER — DEXTROSE 5 % IV SOLN: INTRAVENOUS | Status: DC

## 2021-10-25 MED ORDER — FENTANYL CITRATE (PF) 100 MCG/2ML IJ SOLN
50.0000 ug | Freq: Once | INTRAMUSCULAR | Status: AC
  Administered 2021-10-25: 50 ug via INTRAVENOUS
  Filled 2021-10-25: qty 2

## 2021-10-25 NOTE — Progress Notes (Signed)
Patient had requested to go on vent due to feeling tired. Patient now says he wants to go back on ATC. Placed on 40% ATC, tolerating well at this time.  ?

## 2021-10-25 NOTE — Progress Notes (Signed)
Pt states he is tired and in pain, increase in respirations.  Respiratory aware and patient to be removed from trach collar and placed back on ventilator.  ?

## 2021-10-25 NOTE — TOC Progression Note (Signed)
Transition of Care (TOC) - Initial/Assessment Note  ? ? ?Patient Details  ?Name: Troy Sampson ?MRN: [id number] ?Date of Birth: Jan 10, 1956 ? ?Transition of Care (TOC) CM/SW Contact:    ?Catalina Pizza Yenifer Saccente, LCSWA ?Phone Number: ?10/25/2021, 11:29 AM ? ?Clinical Narrative:                 ?CSW spoke with the patient's daughter, and inquired about insurance coverage as the patient is underinsured.  The daughter reported that she has been researching Medicare part B and will look in to applying within the next "few days".  CSW will follow up with the daughter on Wednesday to inquire about insurance coverage.   ? ?Expected Discharge Plan: Long Term Nursing Home ?  ? ? ?Patient Goals and CMS Choice ?  ?  ?  ? ?Expected Discharge Plan and Services ?Expected Discharge Plan: Long Term Nursing Home ?  ?  ?  ?  ?                ?  ?  ?  ?  ?  ?  ?  ?  ?  ?  ? ?Prior Living Arrangements/Services ?  ?  ?  ?       ?  ?  ?  ?  ? ?Activities of Daily Living ?  ?  ? ?Permission Sought/Granted ?  ?  ?   ?   ?   ?   ? ?Emotional Assessment ?  ?  ?  ?  ?  ?  ? ?Admission diagnosis:  Acute respiratory failure with hypoxia (HCC) [J96.01] ?Community acquired pneumonia of left lower lobe of lung [J18.9] ?Sepsis, due to unspecified organism, unspecified whether acute organ dysfunction present (HCC) [A41.9] ?Acute respiratory failure (HCC) [J96.00] ?Patient Active Problem List  ? Diagnosis Date Noted  ? Upper GI bleed   ? Status post tracheostomy (HCC)   ? Protein-calorie malnutrition, severe 10/01/2021  ? Pressure injury of skin 09/30/2021  ? Pneumonia, pneumococcal (HCC) 09/30/2021  ? Pneumococcal bacteremia 09/30/2021  ? Acute metabolic encephalopathy 09/30/2021  ? Alteration in electrolyte and fluid balance 09/30/2021  ? Hepatitis C 09/30/2021  ? Liver mass 09/30/2021  ? Anemia 09/30/2021  ? NSTEMI (non-ST elevated myocardial infarction) (HCC) 09/30/2021  ? Thrombocytopenia (HCC) 09/30/2021  ? Acute respiratory failure (HCC) 09/22/2021  ?  Protein calorie malnutrition (HCC) 09/22/2021  ? Acute respiratory failure with hypoxia (HCC)   ? Sepsis (HCC)   ? Community acquired pneumonia of left lower lobe of lung 09/21/2021  ? ?PCP:  Pcp, No ?Pharmacy:   ?[id number] - [place], [place] - [address] ?[address] ?[place] [address] ?Phone: [phone] Fax: [phone] ? ? ? ? ?Social Determinants of Health (SDOH) Interventions ?Alcohol Brief Interventions/Follow-up: Patient Refused ? ?Readmission Risk Interventions ?   ? View : No data to display.  ?  ?  ?  ? ? ? ?

## 2021-10-25 NOTE — Progress Notes (Signed)
SLP Cancellation Note ? ?Patient Details ?Name: Troy Sampson ?MRN: [id number] ?DOB: 05/25/1956 ? ? ?Cancelled treatment:       Reason Eval/Treat Not Completed: Other (comment). Pt resting, requests PMSV "later." Pt had PEG tube placement today and is fatigued. Will try again tomorrow.  ? ? ?Wlliam Grosso, Riley Nearing ?10/25/2021, 2:13 PM ?

## 2021-10-25 NOTE — Procedures (Signed)
Vascular and Interventional Radiology Procedure Note ? ?Patient: Troy Sampson ?DOB: [birth year] ?Medical Record Number: [id number] ?Note Date/Time: 10/25/21 9:56 AM  ? ?Performing Physician: Roanna Banning, MD ?Assistant(s): None ? ?Diagnosis: Dysphagia ? ?Procedure: PERCUTANEOUS GASTROSTOMY TUBE PLACEMENT ? ?Anesthesia: Conscious Sedation ?Complications: None ?Estimated Blood Loss:  0 mL ? ?Findings:  ?Successful placement of a 42F gastrostomy tube under fluoroscopy. ? ? ?See detailed procedure note with images in PACS. ?The patient tolerated the procedure well without incident or complication and was returned to Floor Bed in stable condition.  ? ?Plan ?Pt will return to VIR for routine G-tube exchange and upsize to 54F MIC in 6 months ? ? ?Roanna Banning, MD ?[facility] ?[facility] ? ? ?Pager. [phone] ?Clinic. [phone]  ?

## 2021-10-25 NOTE — Procedures (Signed)
Chest [place]: ? ?Bedside chest [place] was performed showing Moderate to large L sided pleural effusion with atelectatic lung. ? ? ? ? ?

## 2021-10-25 NOTE — Progress Notes (Signed)
PT Cancellation Note ? ?Patient Details ?Name: Troy Sampson ?MRN: [id number] ?DOB: Apr 05, 1956 ? ? ?Cancelled Treatment:    Reason Eval/Treat Not Completed: (P) Patient at procedure or test/unavailable Pt is off the floor for PEG placement. PT will follow back for treatment as able this afternoon. ? ?Shalamar Crays B. Beverely Risen PT, DPT ?Acute Rehabilitation Services ?Pager [phone] ?Office [phone] ? ? ? ?Elon Alas Fleet ?10/25/2021, 10:09 AM ? ? ?

## 2021-10-25 NOTE — Progress Notes (Signed)
? ?NAME:  Troy Sampson, MRN:  [id number], DOB:  [birth year], LOS: 33 ?ADMISSION DATE:  09/21/2021, CONSULTATION DATE:  09/22/2021 ?REFERRING MD:  [facility], CHIEF COMPLAINT:  Shortness of Breath  ? ?History of Present Illness:  ?66 y/o male with a PMHx of duodenal ulcer, IDA, nephrolithiasis and a current smoker who presented from [facility] with c/o SOB x 1 week. History obtained by his wife due to patient arrived obtunded, markedly hypoxic oxygen sats in the low 80s. Patient placed on BiPAP and given Ativan. He was fairly tachypnis with RR in 40s as well as tachycardia and was ultimately intubated for acute hypoxic respiratory failure and increased work of breathing. Noted to have a fever of 101.8. CXR show a LLL pneumonia. PCCM consulted for ICU admission.  ? 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 ?Significant Hospital Events: ?Including procedures, antibiotic start and stop dates in addition to other pertinent events   ?2/22: Admitted overnight to ICU. Blood cultures 3/4 positive for strep pneumo. Strep pneumo antigen positive, TTE with EF 50 to 55% no wall motion abnormalities ?2/26: New onset fevers. Antibiotics broadened to Cefepime and Vancomycin. Propofol switched to Precedex.  ?2/27: Bradycardia noted on Precedex with increased tachypnea.  ?2/28, antibiotics changed to penicillin G per infectious disease recommendation ?3/1: Vasoactive drip requirements improving, received 1 unit of blood for hemoglobin of 6.1 ?3/2 follow-up hemoglobin 6.8, received second unit of blood, has also received 3 units of platelets since admission.  Off norepinephrine  ?3/3 EGD> grade D esophagitis with bleeding ?3/4 tracheostomy ?3/7 ongoing tachypnea limiting SBT. Unable to SBT at all this morning. Overnight episode of bradycardia when being turned requiring atropine ?3/15 palliative consulted, made limited code with no CPR ?3/17 ongoing palliative care  discussions ?3/18 Transfused 1 unit PRBC for low hemoglobin with no active bleed ?3/24 IR consult for PEG ? ?Interim History / Subjective:  ?Patient is awake and alert this morning.  Saw him on the way to IR.  ? ?Objective   ?Blood pressure 131/79, pulse 71, temperature 98.5 ?F (36.9 ?C), temperature source Oral, resp. rate 16, height 5\' 7"  (1.702 m), weight 43.5 kg, SpO2 97 %. ?   ?Vent Mode: Stand-by ?FiO2 (%):  [35 %-40 %] 35 % ?Set Rate:  [18 bmp] 18 bmp ?Vt Set:  [500 mL] 500 mL ?PEEP:  [5 cmH20] 5 cmH20 ?Plateau Pressure:  [16 cmH20] 16 cmH20  ? ?Intake/Output Summary (Last 24 hours) at 10/25/2021 10/27/2021 ?Last data filed at 10/25/2021 0600 ?Gross per 24 hour  ?Intake 1623.83 ml  ?Output 4755 ml  ?Net -3131.17 ml  ? ? ?Filed Weights  ? 10/23/21 0500 10/24/21 0500 10/25/21 0500  ?Weight: 52.4 kg 46.8 kg 43.5 kg  ? ?Physical exam: ? ?General - alert, awake and oriented. Not in acute distress ?Eyes - pupils reactive ?ENT - trach site clean.  NG tube in place ?Chest -Normal work of breathing. On vent ?Extremities - decreased muscle bulk.  No edema. ?Skin - no rashes ?Neuro - AO x 4 ? ?Resolved Hospital Problem list   ?Septic shock, Lactic acidosis, Thrombocytopenia, Type 2 MI, Acute urinary retention, Aspiration pneumonia, Right knee pseudogout ? ?Assessment & Plan:  ?Acute hypoxic/hypercapnic respiratory failure. ?Failure to wean s/p tracheostomy. ?Presumed COPD. ?- pressure support wean to TC as able.   ?- trach care ?- goal SpO2 > 92% ?- f/u CXR intermittently ?- continue pulmicort, brovana, yupelri ?- prn albuterol ? ?  ABLA from Upper GI bleeding with esophagitis. ?Anemia of critical illness, iron deficiency and chronic disease. ?- f/u CBC ?- transfuse for Hb < 7 ?- continue protonix and pepcid >> continue IV until PEG placed ?- Resume DVT prop after PEG ? ?HTN, HLD. ?- goals BP normotensive ?- continue norvasc,and coreg ?- Continue lipitor ? ?Liver mass, Hepatitis C. ?- Three solitary liver masses suspicious for  hepatocellular carcinoma. No other metastatic disease. Not a candidate for biopsy given nutrition status. ?- will need outpt assessment when medically stable ? ?Severe protein calorie malnutrition, cachexia ?- IR consulted for PEG placement ?- resume tube feeds after PEG placement ? ?Goals of care. ?- palliative care consulted ? ?Best Practice (right click and "Reselect all SmartList Selections" daily)  ? ?Diet/type: tubefeeds ?DVT prophylaxis: SCD.  Hold DVT prophylaxis for PEG placement tomorrow ?GI prophylaxis: PPI, H2 blocker ?Lines: N/A ?Foley:  N/A ?Code Status:  Partial. No CPR ?Last date of multidisciplinary goals of care discussion: Will update family ? ?Labs:  ? ? ?  Latest Ref Rng & Units 10/24/2021  ?  3:12 AM 10/22/2021  ?  5:00 AM 10/21/2021  ?  3:49 AM  ?CMP  ?Glucose 70 - 99 mg/dL 92   94   784    ?BUN 8 - 23 mg/dL 23   27   25     ?Creatinine 0.61 - 1.24 mg/dL   6.96   2.95    ?Sodium 135 - 145 mmol/L 133  C 135   140    ?Potassium 3.5 - 5.1 mmol/L 4.7   4.6   4.9    ?Chloride 98 - 111 mmol/L 100  C 101   106    ?CO2 22 - 32 mmol/L 29  C 30   31    ?Calcium 8.9 - 10.3 mg/dL 8.6   8.6   8.4    ?Total Protein 6.5 - 8.1 g/dL  6.9     ?Total Bilirubin 0.3 - 1.2 mg/dL  0.7     ?Alkaline Phos 38 - 126 U/L  197     ?AST 15 - 41 U/L  93     ?ALT 0 - 44 U/L  87     ?  ?C Corrected result  ? ? ? ? ?  Latest Ref Rng & Units 10/24/2021  ?  3:12 AM 10/23/2021  ?  4:19 AM 10/22/2021  ?  5:00 AM  ?CBC  ?WBC 4.0 - 10.5 K/uL 22.4   20.3   22.1    ?Hemoglobin 13.0 - 17.0 g/dL 8.6   7.6   8.2    ?Hematocrit 39.0 - 52.0 % 27.4   23.2   25.8    ?Platelets 150 - 400 K/uL 280   246   259    ? ? ?ABG ?   ?Component Value Date/Time  ? PHART 7.498 (H) 10/09/2021 0106  ? PCO2ART 43.4 10/09/2021 0106  ? PO2ART 64 (L) 10/09/2021 0106  ? HCO3 33.8 (H) 10/09/2021 0106  ? TCO2 35 (H) 10/09/2021 0106  ? ACIDBASEDEF 1.0 09/28/2021 0405  ? O2SAT 94 10/09/2021 0106  ? ? ?CBG (last 3)  ?Recent Labs  ?  10/25/21 ?0031 10/25/21 ?10/27/21  10/25/21 ?10/27/21  ?GLUCAP 120* 103* 96  ? ? ? ?Signature:  ? ?4010, DO ? ? ?

## 2021-10-25 NOTE — Progress Notes (Signed)
Patient transported on vent from IR to 6M ?

## 2021-10-25 NOTE — Progress Notes (Signed)
PT Cancellation Note ? ?Patient Details ?Name: Troy Sampson ?MRN: [id number] ?DOB: 1955-09-18 ? ? ?Cancelled Treatment:    Reason Eval/Treat Not Completed: (P) Patient declined, no reason specified ?Pt refused participation in bed level exercise due to PEG placement today and stomach upset. Pt reports he will get up to the chair tomorrow.  ? ?Beyonce Sawatzky B. Beverely Risen PT, DPT ?Acute Rehabilitation Services ?Pager [phone] ?Office [phone] ? ? ?Elon Alas Fleet ?10/25/2021, 2:40 PM ? ? ?

## 2021-10-26 LAB — GLUCOSE, CAPILLARY
Glucose-Capillary: 103 mg/dL — ABNORMAL HIGH (ref 70–99)
Glucose-Capillary: 85 mg/dL (ref 70–99)
Glucose-Capillary: 140 mg/dL — ABNORMAL HIGH (ref 70–99)
Glucose-Capillary: 135 mg/dL — ABNORMAL HIGH (ref 70–99)
Glucose-Capillary: 135 mg/dL — ABNORMAL HIGH (ref 70–99)

## 2021-10-26 LAB — CBC
HCT: 31.5 % — ABNORMAL LOW (ref 39.0–52.0)
Hemoglobin: 9.9 g/dL — ABNORMAL LOW (ref 13.0–17.0)
MCH: 26.8 pg (ref 26.0–34.0)
MCHC: 31.4 g/dL (ref 30.0–36.0)
MCV: 85.4 fL (ref 80.0–100.0)
Platelets: 334 10*3/uL (ref 150–400)
RBC: 3.69 MIL/uL — ABNORMAL LOW (ref 4.22–5.81)
RDW: 20.3 % — ABNORMAL HIGH (ref 11.5–15.5)
WBC: 23.1 10*3/uL — ABNORMAL HIGH (ref 4.0–10.5)
nRBC: 0 % (ref 0.0–0.2)

## 2021-10-26 LAB — BASIC METABOLIC PANEL
Anion gap: 6 (ref 5–15)
BUN: 22 mg/dL (ref 8–23)
CO2: 25 mmol/L (ref 22–32)
Calcium: 8.8 mg/dL — ABNORMAL LOW (ref 8.9–10.3)
Chloride: 99 mmol/L (ref 98–111)
Creatinine, Ser: 0.31 mg/dL — ABNORMAL LOW (ref 0.61–1.24)
GFR, Estimated: >60 mL/min (ref >60)
Glucose, Bld: 99 mg/dL (ref 70–99)
Potassium: 4.4 mmol/L (ref 3.5–5.1)
Sodium: 130 mmol/L — ABNORMAL LOW (ref 135–145)

## 2021-10-26 MED ORDER — CHLORHEXIDINE GLUCONATE 0.12 % MT SOLN
15.0000 mL | Freq: Two times a day (BID) | OROMUCOSAL | Status: DC
  Administered 2021-10-26 – 2021-10-29 (×7): 15 mL via OROMUCOSAL
  Filled 2021-10-26 (×7): qty 15

## 2021-10-26 MED ORDER — ORAL CARE MOUTH RINSE
15.0000 mL | Freq: Two times a day (BID) | OROMUCOSAL | Status: DC
  Administered 2021-10-26 – 2021-10-29 (×6): 15 mL via OROMUCOSAL

## 2021-10-26 MED ORDER — FENTANYL CITRATE PF 50 MCG/ML IJ SOSY: 25.0000 ug | PREFILLED_SYRINGE | INTRAMUSCULAR | Status: DC | PRN

## 2021-10-26 MED ORDER — HYDROCHLOROTHIAZIDE 12.5 MG PO TABS
6.2500 mg | ORAL_TABLET | Freq: Every day | ORAL | Status: DC
  Administered 2021-10-26 – 2021-10-29 (×4): 6.25 mg
  Filled 2021-10-26 (×4): qty 1

## 2021-10-26 NOTE — Progress Notes (Signed)
? ?NAME:  Troy Sampson, MRN:  [id number], DOB:  11/28/1955, LOS: 34 ?ADMISSION DATE:  09/21/2021, CONSULTATION DATE:  09/22/2021 ?REFERRING MD:  [facility], CHIEF COMPLAINT:  Shortness of Breath  ? ?History of Present Illness:  ?66 y/o male with a PMHx of duodenal ulcer, IDA, nephrolithiasis and a current smoker who presented from [facility] with c/o SOB x 1 week. History obtained by his wife due to patient arrived obtunded, markedly hypoxic oxygen sats in the low 80s. Patient placed on BiPAP and given Ativan. He was fairly tachypnis with RR in 40s as well as tachycardia and was ultimately intubated for acute hypoxic respiratory failure and increased work of breathing. Noted to have a fever of 101.8. CXR show a LLL pneumonia. PCCM consulted for ICU admission.  ? 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 ?Significant Hospital Events: ?Including procedures, antibiotic start and stop dates in addition to other pertinent events   ?2/22: Admitted overnight to ICU. Blood cultures 3/4 positive for strep pneumo. Strep pneumo antigen positive, TTE with EF 50 to 55% no wall motion abnormalities ?2/26: New onset fevers. Antibiotics broadened to Cefepime and Vancomycin. Propofol switched to Precedex.  ?2/27: Bradycardia noted on Precedex with increased tachypnea.  ?2/28, antibiotics changed to penicillin G per infectious disease recommendation ?3/1: Vasoactive drip requirements improving, received 1 unit of blood for hemoglobin of 6.1 ?3/2 follow-up hemoglobin 6.8, received second unit of blood, has also received 3 units of platelets since admission.  Off norepinephrine  ?3/3 EGD> grade D esophagitis with bleeding ?3/4 tracheostomy ?3/7 ongoing tachypnea limiting SBT. Unable to SBT at all this morning. Overnight episode of bradycardia when being turned requiring atropine ?3/15 palliative consulted, made limited code with no CPR ?3/17 ongoing palliative care  discussions ?3/18 Transfused 1 unit PRBC for low hemoglobin with no active bleed ?3/24 IR consult for PEG ? ?Interim History / Subjective:  ?Patient is awake and alert this morning.  Endorses some soreness in the abdomen. ? ?Objective   ?Blood pressure (!) 147/79, pulse 69, temperature 98.2 ?F (36.8 ?C), temperature source Oral, resp. rate 12, height 5\' 7"  (1.702 m), weight 43.5 kg, SpO2 95 %. ?   ?Vent Mode: Stand-by ?FiO2 (%):  [40 %-100 %] 40 % ?Set Rate:  [18 bmp] 18 bmp ?Vt Set:  [500 mL] 500 mL ?PEEP:  [5 cmH20] 5 cmH20  ? ?Intake/Output Summary (Last 24 hours) at 10/26/2021 0752 ?Last data filed at 10/26/2021 0700 ?Gross per 24 hour  ?Intake 597.72 ml  ?Output 2800 ml  ?Net -2202.28 ml  ? ? ?Filed Weights  ? 10/24/21 0500 10/25/21 0500 10/26/21 0500  ?Weight: 46.8 kg 43.5 kg 43.5 kg  ? ?Physical exam: ? ?General - alert, awake and oriented. Not in acute distress ?HEENT -pupils reactive.  Trach site clean.  On trach collar ?Card-regular rate rhythm.  No murmur ?Chest -Normal work of breathing. On trach collar ?Extremities - decreased muscle bulk.  No edema. ?Skin - no rashes ?Neuro - AO x 4 ? ?Resolved Hospital Problem list   ?Septic shock, Lactic acidosis, Thrombocytopenia, Type 2 MI, Acute urinary retention, Aspiration pneumonia, Right knee pseudogout ? ?Assessment & Plan:  ?Acute hypoxic/hypercapnic respiratory failure. ?Failure to wean s/p tracheostomy. ?Presumed COPD. ?Patient was on trach collar since yesterday afternoon. If can tolerate until this afternoon, will transfer out of ICU.   ?- trach care ?- goal SpO2 > 92% ?- continue pulmicort, brovana, yupelri ?-  prn albuterol ?- Passy Muir valve with SLP ? ?ABLA from Upper GI bleeding with esophagitis. ?Anemia of critical illness, iron deficiency and chronic disease. ?Hemoglobin improving.  Status post IV iron supplement ?- transfuse for Hb < 7 ?- continue protonix and pepcid  ?- Resume DVT prop  ? ?HTN, HLD. ?Goals BP normotensive ?- Continue amlodipine ?-  Stop carvedilol and start HCTZ better blood pressure control and diuresis ?- Continue lipitor ? ?Liver mass, Hepatitis C. ?- Three solitary liver masses suspicious for hepatocellular carcinoma. No other metastatic disease. Not a candidate for biopsy given nutrition status. ?- will need outpt assessment when medically stable ? ?Severe protein calorie malnutrition, cachexia ?-Tube feeds after PEG placement ? ?Best Practice (right click and "Reselect all SmartList Selections" daily)  ? ?Diet/type: tubefeeds ?DVT prophylaxis: lovenox ?GI prophylaxis: PPI, H2 blocker ?Lines: N/A ?Foley:  N/A ?Code Status:  Partial. No CPR ?Last date of multidisciplinary goals of care discussion: Will update family ? ?Labs:  ? ? ?  Latest Ref Rng & Units 10/26/2021  ?  6:06 AM 10/24/2021  ?  3:12 AM 10/22/2021  ?  5:00 AM  ?CMP  ?Glucose 70 - 99 mg/dL 99   92   94    ?BUN 8 - 23 mg/dL 22   23   27     ?Creatinine 0.61 - 1.24 mg/dL   5.78   4.69    ?Sodium 135 - 145 mmol/L 130   133  C 135    ?Potassium 3.5 - 5.1 mmol/L 4.4   4.7   4.6    ?Chloride 98 - 111 mmol/L 99   100  C 101    ?CO2 22 - 32 mmol/L 25   29  C 30    ?Calcium 8.9 - 10.3 mg/dL 8.8   8.6   8.6    ?Total Protein 6.5 - 8.1 g/dL   6.9    ?Total Bilirubin 0.3 - 1.2 mg/dL   0.7    ?Alkaline Phos 38 - 126 U/L   197    ?AST 15 - 41 U/L   93    ?ALT 0 - 44 U/L   87    ?  ?C Corrected result  ? ? ? ? ?  Latest Ref Rng & Units 10/26/2021  ?  6:06 AM 10/24/2021  ?  3:12 AM 10/23/2021  ?  4:19 AM  ?CBC  ?WBC 4.0 - 10.5 K/uL 23.1   22.4   20.3    ?Hemoglobin 13.0 - 17.0 g/dL 9.9   8.6   7.6    ?Hematocrit 39.0 - 52.0 % 31.5   27.4   23.2    ?Platelets 150 - 400 K/uL 334   280   246    ? ? ?ABG ?   ?Component Value Date/Time  ? PHART 7.498 (H) 10/09/2021 0106  ? PCO2ART 43.4 10/09/2021 0106  ? PO2ART 64 (L) 10/09/2021 0106  ? HCO3 33.8 (H) 10/09/2021 0106  ? TCO2 35 (H) 10/09/2021 0106  ? ACIDBASEDEF 1.0 09/28/2021 0405  ? O2SAT 94 10/09/2021 0106  ? ? ?CBG (last 3)  ?Recent Labs  ?   10/25/21 ?2344 10/26/21 ?0330 10/26/21 ?0737  ?GLUCAP 102* 103* 85  ? ? ? ?Signature:  ? ?10/28/21, DO ? ? ?

## 2021-10-26 NOTE — Progress Notes (Signed)
? ? ?Referring Physician(s): ?Dr Marsa Aris ? ?Supervising Physician: Roanna Banning ? ?Patient Status:  [facility] - In-pt ? ?Chief Complaint: ? ?Dysphagia ? ?Subjective: ? ?Perc G tube placed in IR 3/27 ?I have seen pt and evaluated site ? ?Allergies: ?Patient has no known allergies. ? ?Medications: ?Prior to Admission medications   ?Medication Sig Start Date End Date Taking? Authorizing Provider  ?acetaminophen (TYLENOL) 500 MG tablet Take 1,000 mg by mouth every 6 (six) hours as needed for moderate pain.   Yes [provider]  ?Ferrous Sulfate (IRON PO) Take 1 capsule by mouth daily.   Yes [provider]  ?sodium chloride (OCEAN) 0.65 % SOLN nasal spray Place 1 spray into both nostrils as needed for congestion.   Yes [provider]  ? ? ? ?Vital Signs: ?BP (!) 146/78   Pulse 74   Temp 98.4 ?F (36.9 ?C) (Oral)   Resp 16   Ht 5\' 7"  (1.702 m)   Wt 95 lb 14.4 oz (43.5 kg)   SpO2 93%   BMI 15.02 kg/m?  ? ?Physical Exam ?Vitals reviewed.  ?Skin: ?   Comments: Site is clean and dry NT no bleeding ?T tacs intact ?May use now ? ?We will remove t tacs in 10 days  ? ? ?Imaging: ?IR GASTROSTOMY TUBE MOD SED ? ?Result Date: 10/25/2021 ?INDICATION: Dysphagia. EXAM: PUSH GASTROSTOMY TUBE PLACEMENT COMPARISON:  CT AP, 10/21/2021.  Chest XR, 10/25/2018. MEDICATIONS: Ancef 2 gm IV; Antibiotics were administered within 1 hour of the procedure. CONTRAST:  15 mL Omnipaque 300 mL of Isovue 300 administered into the gastric lumen. ANESTHESIA/SEDATION: Moderate (conscious) sedation was employed during this procedure. A total of Versed 1 mg and Fentanyl 25 mcg was administered intravenously. Moderate Sedation Time: 21 minutes. The patient's level of consciousness and vital signs were monitored continuously by radiology nursing throughout the procedure under my direct supervision. FLUOROSCOPY TIME:  0 minutes 48 seconds (1.9 mGy) COMPLICATIONS: None immediate. PROCEDURE: Informed written consent was obtained from  the patient and/or patient's representative following explanation of the procedure, risks, benefits and alternatives. A time out was performed prior to the initiation of the procedure. Ultrasound scanning was performed to demarcate the edge of the left lobe of the liver. Maximal barrier sterile technique utilized including caps, mask, sterile gowns, sterile gloves, large sterile drape, hand hygiene and Betadine prep. The left upper quadrant was sterilely prepped and draped. A oral gastric catheter was inserted into the stomach under fluoroscopy. The existing nasogastric feeding tube was removed. The left costal margin and barium opacified transverse colon were identified and avoided. Air was injected into the stomach for insufflation and visualization under fluoroscopy. Under sterile conditions and local anesthesia, 2 t tacks were utilized to pexy the anterior aspect of the stomach against the ventral abdominal wall. Contrast injection confirmed appropriate positioning of each of the T tacks. An incision was made between the T tacks and a 17 gauge trocar needle was utilized to access the stomach. Needle position was confirmed within the stomach with aspiration of air and injection of a small amount of contrast. A stiff Glidewire was advanced into the gastric lumen and under intermittent fluoroscopic guidance, the access needle was exchanged for a Kumpe catheter. With the use of the Kumpe catheter, a stiff Glidewire was advanced into the horizontal segment of the duodenum. Under intermittent fluoroscopic guidance, the Kumpe catheter was exchanged for a telescoping peel-away sheath, ultimately allowing placement of a 16 Fr balloon retention gastrostomy tube. The retention  balloon was insufflated with a mixture of dilute saline and contrast and pulled taut against the anterior wall of the stomach. The external disc was cinched. Contrast injection confirms positioning within the stomach. Several spot radiographic images  were obtained in various obliquities for documentation. T he patient tolerated procedure well without immediate post procedural complication. FINDINGS: After successful fluoroscopic guided placement, the gastrostomy tube is appropriately positioned with internal retention balloon against the ventral aspect of the gastric lumen. IMPRESSION: Successful fluoroscopic insertion of an 16 Fr balloon retention gastrostomy tube. The gastrostomy may be used immediately for medication administration and in 24 hrs for the initiation of feeds. PLAN: Secondary to a shortage of gastrostomy tubes, a 16 Fr tube was placed today. The patient will return to interventional radiology for routine feeding tube exchange and upsize to an 18 Fr balloon-inflatable tube in 6 months. Roanna Banning, MD [facility] Radiology Electronically Signed   By: Roanna Banning M.D.   On: 10/25/2021 12:40  ? ?DG Chest Port 1 View ? ?Result Date: 10/24/2021 ?CLINICAL DATA:  Shortness of breath.  Follow-up exam. EXAM: PORTABLE CHEST 1 VIEW COMPARISON:  10/20/2021 and older studies. FINDINGS: Interval increase in opacity at the right lung base, now obscuring the right hemidiaphragm. Milder increased opacity at the left lung base. There are persistent interstitial and hazy airspace opacities in the mid and lower lungs. Upper lungs are clear. No pneumothorax. Tracheostomy tube, enteric tube and left PICC are stable. IMPRESSION: 1. Interval increase in lung base opacities most likely due to increased pleural effusions. 2. Persistent bilateral airspace lung opacities predominantly in the central and lower lungs consistent with multifocal pneumonia. 3. Stable support apparatus. Electronically Signed   By: Amie Portland M.D.   On: 10/24/2021 08:23   ? ?Labs: ? ?CBC: ?Recent Labs  ?  10/22/21 ?0500 10/23/21 ?0419 10/24/21 ?8185 10/26/21 ?0606  ?WBC 22.1* 20.3* 22.4* 23.1*  ?HGB 8.2* 7.6* 8.6* 9.9*  ?HCT 25.8* 23.2* 27.4*  31.5*  ?PLT 259 246 280 334  ? ? ?COAGS: ?Recent Labs  ?  09/23/21 ?6314 09/24/21 ?0254 09/26/21 ?9702 10/07/21 ?6378 10/12/21 ?5885  ?INR 1.1 1.1 1.1 1.0 1.1  ?APTT 26 27  --  25 32  ? ? ?BMP: ?Recent Labs  ?  10/21/21 ?0277 10/22/21 ?0500 10/24/21 ?4128 10/26/21 ?0606  ?NA 140 135 133* 130*  ?K 4.9 4.6 4.7 4.4  ?CL 106 101 100 99  ?CO2 31 30 29 25   ?GLUCOSE 151* 94 92 99  ?BUN 25* 27* 23 22  ?CALCIUM 8.4* 8.6* 8.6* 8.8*  ?CREATININE 0.33* 0.35* 0.32* 0.31*  ?GFRNONAA >60 >60 >60 >60  ? ? ?LIVER FUNCTION TESTS: ?Recent Labs  ?  10/14/21 ?0217 10/15/21 ?0221 10/19/21 ?0253 10/22/21 ?0500  ?BILITOT 0.7 0.6 0.6 0.7  ?AST 142* 159* 72* 93*  ?ALT 99* 125* 63* 87*  ?ALKPHOS 226* 265* 174* 197*  ?PROT 5.1* 6.3* 5.9* 6.9  ?ALBUMIN <1.5* <1.5* <1.5* <1.5*  ? ? ?Assessment and Plan: ? ?Percutaneous gastric tube placement 3/27 in IR ?May use now ?IR will remove T tacs 10 days ? ?Electronically Signed: ?4/27, PA-C ?10/26/2021, 7:17 AM ? ? ?I spent a total of 15 Minutes at the the patient's bedside AND on the patient's hospital floor or unit, greater than 50% of which was counseling/coordinating care for G tube ? ? ? ?  ?

## 2021-10-26 NOTE — Progress Notes (Signed)
Physical Therapy Treatment ?Patient Details ?Name: Troy Sampson ?MRN: [id number] ?DOB: October 07, 1955 ?Today's Date: 10/26/2021 ? ? ?History of Present Illness Pt is a 66 y/o male who presented to the ED 2/22 with SOB. After arrival, pt became obtunded due to severe hypoxia and subsequently intubated emergently. Pt admitted with dx of acute hypoxic/hypercapnic respiratory failure 2nd to community acquired pneumonia.  Further testing revealed Hep C, suspected COPD, and potential underlying malignancy. Pt underwent tracheostomy 3/4. PMHx: duodenal ulcer, IDA, nephrolithiasis, ETOH ? ?  ?PT Comments  ? ? Pt agreeable and able to tolerate OOB to chair today. Continue to work on steadily improving mobility and activity tolerance.    ?Recommendations for follow up therapy are one component of a multi-disciplinary discharge planning process, led by the attending physician.  Recommendations may be updated based on patient status, additional functional criteria and insurance authorization. ? ?Follow Up Recommendations ? Long-term institutional care without follow-up therapy ?  ?  ?Assistance Recommended at Discharge Frequent or constant Supervision/Assistance  ?Patient can return home with the following Two people to help with walking and/or transfers;Two people to help with bathing/dressing/bathroom;Assistance with cooking/housework;Assistance with feeding;Direct supervision/assist for medications management;Direct supervision/assist for financial management;Assist for transportation;Help with stairs or ramp for entrance;Other (comment) ?  ?Equipment Recommendations ? Hospital bed;BSC/3in1;Wheelchair (measurements PT);Wheelchair cushion (measurements PT) (hoyer lift)  ?  ?Recommendations for Other Services   ? ? ?  ?Precautions / Restrictions Precautions ?Precautions: Fall;Other (comment) ?Precaution Comments: trach collar, NG tube, flexiseal ?Restrictions ?Weight Bearing Restrictions: No  ?  ? ?Mobility ? Bed Mobility ?Overal bed  mobility: Needs Assistance ?Bed Mobility: Rolling ?Rolling: Total assist, +2 for safety/equipment ?  ?  ?  ?  ?General bed mobility comments: Rolled rt and left for maxisky pad placement. Pt reaching with UE with assist ?  ? ?Transfers ?Overall transfer level: Needs assistance ?  ?Transfers: Bed to chair/wheelchair/BSC ?  ?  ?  ?  ?  ?  ?General transfer comment: Used maxisky for bed to recliner to focus on balance and strengthening in the chair ?Transfer via Lift Equipment: Maxisky ? ?Ambulation/Gait ?  ?  ?  ?  ?  ?  ?  ?  ? ? ?Stairs ?  ?  ?  ?  ?  ? ? ?Wheelchair Mobility ?  ? ?Modified Rankin (Stroke Patients Only) ?  ? ? ?  ?Balance Overall balance assessment: Needs assistance ?Sitting-balance support: Bilateral upper extremity supported, Feet unsupported ?Sitting balance-Leahy Scale: Poor ?Sitting balance - Comments: Able to sit edge of chair with min to min guard assist with UE support ?  ?  ?  ?  ?  ?  ?  ?  ?  ?  ?  ?  ?  ?  ?  ?  ? ?  ?Cognition Arousal/Alertness: Awake/alert ?Behavior During Therapy: Flat affect ?Overall Cognitive Status: Difficult to assess ?Area of Impairment: Attention, Following commands, Safety/judgement, Awareness, Problem solving ?  ?  ?  ?  ?  ?  ?  ?  ?  ?Current Attention Level: Selective ?  ?Following Commands: Follows one step commands with increased time ?Safety/Judgement: Decreased awareness of safety, Decreased awareness of deficits ?Awareness: Intellectual ?Problem Solving: Slow processing, Decreased initiation, Difficulty sequencing, Requires verbal cues, Requires tactile cues ?  ?  ?  ? ?  ?Exercises General Exercises - Lower Extremity ?Long Arc Quad: AAROM, Both, 10 reps, Seated ?Heel Slides: AAROM, Both, 10 reps, Seated ?Straight Leg Raises: AAROM, Both, 5 reps, Seated ?  Other Exercises ?Other Exercises: Pulling trunk foward from back of chair x 5 ? ?  ?General Comments General comments (skin integrity, edema, etc.): VSS on trach collar ?  ?  ? ?Pertinent Vitals/Pain  Pain Assessment ?Pain Assessment: Faces ?Faces Pain Scale: Hurts a little bit ?Pain Location: abdomen ?Pain Descriptors / Indicators: Sore ?Pain Intervention(s): Limited activity within patient's tolerance, Monitored during session  ? ? ?Home Living   ?  ?  ?  ?  ?  ?  ?  ?  ?  ?   ?  ?Prior Function    ?  ?  ?   ? ?PT Goals (current goals can now be found in the care plan section) Progress towards PT goals: Progressing toward goals ? ?  ?Frequency ? ? ? Min 3X/week ? ? ? ?  ?PT Plan Current plan remains appropriate  ? ? ?Co-evaluation   ?  ?  ?  ?  ? ?  ?AM-PAC PT "6 Clicks" Mobility   ?Outcome Measure ? Help needed turning from your back to your side while in a flat bed without using bedrails?: Total ?Help needed moving from lying on your back to sitting on the side of a flat bed without using bedrails?: Total ?Help needed moving to and from a bed to a chair (including a wheelchair)?: Total ?Help needed standing up from a chair using your arms (e.g., wheelchair or bedside chair)?: Total ?Help needed to walk in hospital room?: Total ?Help needed climbing 3-5 steps with a railing? : Total ?6 Click Score: 6 ? ?  ?End of Session Equipment Utilized During Treatment: Oxygen (trach collar) ?Activity Tolerance: Patient tolerated treatment well;Patient limited by fatigue ?Patient left: in chair;with call bell/phone within reach;with chair alarm set ?Nurse Communication: Mobility status;Need for lift equipment ?PT Visit Diagnosis: Other abnormalities of gait and mobility (R26.89) ?  ? ? ?Time: 0962-8366 ?PT Time Calculation (min) (ACUTE ONLY): 31 min ? ?Charges:  $Therapeutic Activity: 23-37 mins          ?          ? ?[facility] PT ?Acute Rehabilitation Services ?Pager [phone] ?Office [phone] ? ? ? ?Angelina Ok [facility] ?10/26/2021, 10:12 AM ? ?

## 2021-10-26 NOTE — Progress Notes (Signed)
Nutrition Follow-up ? ?DOCUMENTATION CODES:  ? ?Severe malnutrition in context of chronic illness ? ?INTERVENTION:  ? ?Continue tube feeds via PEG: ?- Vital 1.5 @ 60 ml/hr (1440 ml/day) ?- ProSource TF 45 ml BID ?  ?Tube feeding regimen provides 2240 kcal, 119 grams of protein, and 1100 ml of H2O. ?  ?- Continue Nutrisource Fiber BID per tube ? ?- Continue MVI with minerals daily per tube ? ?NUTRITION DIAGNOSIS:  ? ?Severe Malnutrition related to chronic illness as evidenced by severe muscle depletion, severe fat depletion. ? ?Ongoing, being addressed via TF ? ?GOAL:  ? ?Patient will meet greater than or equal to 90% of their needs ? ?Met via TF ? ?MONITOR:  ? ?Vent status, Labs, Weight trends, TF tolerance, Skin, I & O's ? ?REASON FOR ASSESSMENT:  ? ?Consult, Ventilator ?Enteral/tube feeding initiation and management ? ?ASSESSMENT:  ? ?Pt with active tobacco dependence and PMH of duodenal ulcer, IDA, and nephrolithiasis admitted with severe acute hypoxic and hypercapnic respiratory failure due to CAP. ? ?02/27 - Cortrak placed (tip gastric, near pylorus) ?03/02 - pt with 2 episodes of emesis ?03/03 - s/p EGD showing friable esophageal mucosa with active signs of bleeding from gastric fundus and esophagus ?03/04 - s/p tracheostomy ?03/05 - Cortrak clogged, NG tube placed ?03/07 - NG tube removed, Cortrak started working again (tip in third portion of duodenum) ?03/20 - brief trial of TC, s/p R knee aspiration ?03/27 - s/p PEG ? ?Discussed pt with RN and during ICU rounds. Pt has been on trach collar since yesterday afternoon. Tube feeds resumed this morning via PEG at goal rate. No tolerance issues noted at this time. Plan to transfer to progressive care this afternoon. ? ?Concern remains for malignancy with liver lesions. Per CCM, pt will require work-up for liver masses as an outpatient. ? ?Weight down to 43.5 kg yesterday and today. Question accuracy given significant weight change in such a short period of time  (46.8 kg on 3/26 and 52.4 kg on 3/25). Will continue to monitor weight trends and adjust tube feeding regimen as appropriate. ? ?Admit weight: 46.3 kg ?Current weight: 43.5 kg ? ?Pt with mild pitting edema to BUE and non-pitting edema to BLE. ? ?Current TF: Vital 1.5 @ 60 ml/hr, ProSource TF 45 ml BID ? ?Medications reviewed and include: pepcid, nutrisource fiber BID, folic acid, SSI q 4 hours, MVI with minerals, protonix, thiamine ? ?Vitamin/Mineral Profile: ?Vitamin B12: 707 (WNL) ?Folate B9: 5.4 (low) ?Ferritin: 674 (high) ? ?Labs reviewed: sodium 130, magnesium 1.6 on 3/26, WBC 23.1, hemoglobin 9.9 ?CBG's: 85-103 x 24 hours ? ?UOP: 2800 ml x 24 hours ?I/O's: +17.2 L since admit ? ?Diet Order:   ?Diet Order   ? ?       ?  Diet NPO time specified  Diet effective midnight       ?  ? ?  ?  ? ?  ? ? ?EDUCATION NEEDS:  ? ?Not appropriate for education at this time ? ?Skin:  Skin Assessment: ?Skin Integrity Issues: ?Stage II: sacrum, R ear ? ?Last BM:  10/26/21 rectal tube ? ?Height:  ? ?Ht Readings from Last 1 Encounters:  ?09/22/21 5' 7"  (1.702 m)  ? ? ?Weight:  ? ?Wt Readings from Last 1 Encounters:  ?10/26/21 43.5 kg  ? ? ?BMI:  Body mass index is 15.02 kg/m?. ? ?Estimated Nutritional Needs:  ? ?Kcal:  2000-2200 ? ?Protein:  90-110 grams ? ?Fluid:  >1.8 L ? ? ? ?Gustavus Bryant,  MS, RD, LDN ?Inpatient Clinical Dietitian ?Please see AMiON for contact information. ? ?

## 2021-10-26 NOTE — Progress Notes (Signed)
Transferred to Progressive floor. Signed out to Dr. Thedore Mins with [facility]. They will pick up patient tomorrow.  ?

## 2021-10-26 NOTE — Progress Notes (Signed)
Speech Language Pathology Treatment: Cognitive-Linquistic  ?Patient Details ?Name: Mallory Enriques ?MRN: [id number] ?DOB: 30-Mar-1956 ?Today's Date: 10/26/2021 ?Time: 5462-7035 ?SLP Time Calculation (min) (ACUTE ONLY): 14 min ? ?Assessment / Plan / Recommendation ?Clinical Impression ? Pt was upright in chair for PMV trial today, agreeable to therapy despite reporting pain at PEG site and feeling "tired." Cuff was deflated and PMV was donned for 10 minutes before SpO2 started to hover around 87-88%. PMV was removed but pt subjectively did not report any changes in breathing. There was also no overt back pressure and SpO2 did not go back up after removal. Suspect just generalized deconditioning and reduced respiratory status as opposed to decreased upper airway patency impacting valve use. Will still use for brief intervals with SLP. Note that pt stayed off the vent over the last 24 hours - if he continues to be able to stay off, would consider transitioning to cuffless trach.  ?  ?HPI HPI: 66 y/o male who presented from [facility] with c/o SOB x 1 week. Pt intubated for acute hypoxic respiratory failure and increased work of breathing. Noted to have a fever of 101.8. CXR show a LLL pneumonia. Intubated from 2/22 to 3/4 when trach placed. 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 Palliative care involved as prognosis for meaningful recovery is poor. Pt has not been able to achieve trach collar. ?  ?   ?SLP Plan ? Continue with current plan of care ? ?  ?  ?Recommendations for follow up therapy are one component of a multi-disciplinary discharge planning process, led by the attending physician.  Recommendations may be updated based on patient status, additional functional criteria and insurance authorization. ?  ? ?Recommendations  ?   ?   ? Patient may use Passy-Muir  Speech Valve: with SLP only ?PMSV Supervision: Full ?MD: Please consider changing trach tube to : Cuffless  ?   ? ? ? ? Follow Up Recommendations: SLP at Long-term acute care hospital ?Assistance recommended at discharge: Frequent or constant Supervision/Assistance ?SLP Visit Diagnosis: Aphonia (R49.1) ?Plan: Continue with current plan of care ? ? ? ? ?  ?  ? ? ?Mahala Menghini., M.A. CCC-SLP ?Acute Rehabilitation Services ?Pager [phone] ?Office [phone] ? ? ?10/26/2021, 10:30 AM ?

## 2021-10-27 DIAGNOSIS — K922 Gastrointestinal hemorrhage, unspecified: Secondary | ICD-10-CM

## 2021-10-27 LAB — BASIC METABOLIC PANEL
Anion gap: 8 (ref 5–15)
BUN: 32 mg/dL — ABNORMAL HIGH (ref 8–23)
CO2: 23 mmol/L (ref 22–32)
Calcium: 8.6 mg/dL — ABNORMAL LOW (ref 8.9–10.3)
Chloride: 99 mmol/L (ref 98–111)
Creatinine, Ser: 0.39 mg/dL — ABNORMAL LOW (ref 0.61–1.24)
GFR, Estimated: >60 mL/min (ref >60)
Glucose, Bld: 137 mg/dL — ABNORMAL HIGH (ref 70–99)
Potassium: 5.4 mmol/L — ABNORMAL HIGH (ref 3.5–5.1)
Sodium: 130 mmol/L — ABNORMAL LOW (ref 135–145)

## 2021-10-27 LAB — GLUCOSE, CAPILLARY
Glucose-Capillary: 111 mg/dL — ABNORMAL HIGH (ref 70–99)
Glucose-Capillary: 113 mg/dL — ABNORMAL HIGH (ref 70–99)
Glucose-Capillary: 113 mg/dL — ABNORMAL HIGH (ref 70–99)
Glucose-Capillary: 125 mg/dL — ABNORMAL HIGH (ref 70–99)
Glucose-Capillary: 125 mg/dL — ABNORMAL HIGH (ref 70–99)
Glucose-Capillary: 126 mg/dL — ABNORMAL HIGH (ref 70–99)
Glucose-Capillary: 131 mg/dL — ABNORMAL HIGH (ref 70–99)
Glucose-Capillary: 132 mg/dL — ABNORMAL HIGH (ref 70–99)
Glucose-Capillary: 133 mg/dL — ABNORMAL HIGH (ref 70–99)
Glucose-Capillary: 136 mg/dL — ABNORMAL HIGH (ref 70–99)
Glucose-Capillary: 141 mg/dL — ABNORMAL HIGH (ref 70–99)
Glucose-Capillary: 141 mg/dL — ABNORMAL HIGH (ref 70–99)
Glucose-Capillary: 146 mg/dL — ABNORMAL HIGH (ref 70–99)
Glucose-Capillary: 90 mg/dL (ref 70–99)

## 2021-10-27 LAB — CBC
HCT: 29.4 % — ABNORMAL LOW (ref 39.0–52.0)
Hemoglobin: 9.5 g/dL — ABNORMAL LOW (ref 13.0–17.0)
MCH: 27.7 pg (ref 26.0–34.0)
MCHC: 32.3 g/dL (ref 30.0–36.0)
MCV: 85.7 fL (ref 80.0–100.0)
Platelets: 239 10*3/uL (ref 150–400)
RBC: 3.43 MIL/uL — ABNORMAL LOW (ref 4.22–5.81)
RDW: 19.9 % — ABNORMAL HIGH (ref 11.5–15.5)
WBC: 25.9 10*3/uL — ABNORMAL HIGH (ref 4.0–10.5)
nRBC: 0 % (ref 0.0–0.2)

## 2021-10-27 MED ORDER — SODIUM ZIRCONIUM CYCLOSILICATE 10 G PO PACK
10.0000 g | PACK | Freq: Every day | ORAL | Status: AC
  Administered 2021-10-27: 10 g
  Filled 2021-10-27: qty 1

## 2021-10-27 MED ORDER — OXYCODONE HCL 5 MG PO TABS
10.0000 mg | ORAL_TABLET | Freq: Four times a day (QID) | ORAL | Status: DC | PRN
  Administered 2021-10-27 – 2021-10-29 (×7): 10 mg
  Filled 2021-10-27 (×7): qty 2

## 2021-10-27 NOTE — Progress Notes (Signed)
Occupational Therapy Treatment ?Patient Details ?Name: Troy Sampson ?MRN: [id number] ?DOB: 1956-04-02 ?Today's Date: 10/27/2021 ? ? ?History of present illness Pt is a 66 y/o male who presented to the ED 2/22 with SOB. After arrival, pt became obtunded due to severe hypoxia and subsequently intubated emergently. Pt admitted with dx of acute hypoxic/hypercapnic respiratory failure 2nd to community acquired pneumonia.  Further testing revealed Hep C, suspected COPD, and potential underlying malignancy. Pt underwent tracheostomy 3/4. PMHx: duodenal ulcer, IDA, nephrolithiasis, ETOH ?  ?OT comments ? Pt in bed throughout session, not agreeable to OOB this session secondary to pain and fatigue.  Nursing made aware and meds brought.  Worked on bed mobility with overall max assist as well as BUE shoulder, elbow, and wrist exercises.  Will continue to follow.  Still recommending long tern acute care rehab.    ? ?Recommendations for follow up therapy are one component of a multi-disciplinary discharge planning process, led by the attending physician.  Recommendations may be updated based on patient status, additional functional criteria and insurance authorization. ?   ?Follow Up Recommendations ? OT at Long-term acute care hospital  ?  ?Assistance Recommended at Discharge Frequent or constant Supervision/Assistance  ?Patient can return home with the following ? Two people to help with walking and/or transfers;Two people to help with bathing/dressing/bathroom;Assistance with cooking/housework;Assistance with feeding;Direct supervision/assist for medications management;Direct supervision/assist for financial management;Assist for transportation;Help with stairs or ramp for entrance ?  ?Equipment Recommendations ? Other (comment) (TBD next venue of care)  ?  ?   ?Precautions / Restrictions Precautions ?Precautions: Fall;Other (comment) ?Precaution Comments: trach collar, NG tube, flexiseal ?Restrictions ?Weight Bearing  Restrictions: No  ? ? ?  ? ?Mobility Bed Mobility ?Overal bed mobility: Needs Assistance ?Bed Mobility: Rolling ?Rolling: Max assist ?  ?  ?  ?  ?General bed mobility comments: Pt rolled both directions with max assist. ?  ? ?Transfers ?  ?  ?  ?  ?  ?  ?  ?  ?  ?  ?  ?  ?   ? ?ADL either performed or assessed with clinical judgement  ? ?ADL Overall ADL's : Needs assistance/impaired ?  ?  ?  ?  ?  ?  ?Lower Body Bathing: Total assistance;Bed level ?Lower Body Bathing Details (indicate cue type and reason): rolling side to side ?  ?  ?  ?  ?  ?  ?Toileting- Clothing Manipulation and Hygiene: Bed level;Total assistance ?  ?  ?  ?  ?General ADL Comments: Pt in bed during session.  He declined OOB stating he was OOB yesterday and in too much pain today.  He did agree to work on UE exercises however (see exercise section for details).  Also worked on cleaning up front and back peri area with pt completing rolling side to side with max demonstrational cueing and light max assist. ?  ? ? ?   ?   ?   ? ?Cognition Arousal/Alertness: Awake/alert ?Behavior During Therapy: Flat affect ?Overall Cognitive Status: Difficult to assess (Secondary to trach) ?Area of Impairment: Following commands ?  ?  ?  ?  ?  ?  ?  ?  ?  ?Current Attention Level: Selective (Pt maintains sustained attention to bed mobility and UE exercises) ?  ?Following Commands: Follows one step commands with increased time ?Safety/Judgement: Decreased awareness of safety, Decreased awareness of deficits ?  ?Problem Solving: Decreased initiation (Slight decreased initiation noted, less than 4 seconds to some commands  given related to UE exercises.) ?  ?  ?  ?   ?Exercises General Exercises - Upper Extremity ?Shoulder Flexion: AAROM, Both, Supine, 10 reps (two sets each) ?Elbow Flexion: AROM, Both, 10 reps, Supine (greater strength on his left compared to the right.) ?Wrist Extension: AROM, Right, Left, 5 reps, Supine ?Digit Composite Flexion: AROM, Both, 10  reps ? ?  ?   ?   ? ? ?Pertinent Vitals/ Pain       Pain Assessment ?Pain Assessment: Faces ?Faces Pain Scale: Hurts even more ?Pain Location: abdomen ?Pain Descriptors / Indicators: Sore ?Pain Intervention(s): RN gave pain meds during session, Repositioned, Monitored during session ? ?   ?   ? ?Frequency ? Min 2X/week  ? ? ? ? ?  ?Progress Toward Goals ? ?OT Goals(current goals can now be found in the care plan section) ? Progress towards OT goals: Progressing toward goals ? ?Acute Rehab OT Goals ?OT Goal Formulation: With patient ?Potential to Achieve Goals: Fair  ?Plan Discharge plan remains appropriate   ? ?   ?AM-PAC OT "6 Clicks" Daily Activity     ?Outcome Measure ? ? Help from another person eating meals?: A Lot ?Help from another person taking care of personal grooming?: A Lot ?Help from another person toileting, which includes using toliet, bedpan, or urinal?: Total ?Help from another person bathing (including washing, rinsing, drying)?: Total ?Help from another person to put on and taking off regular upper body clothing?: A Lot ?Help from another person to put on and taking off regular lower body clothing?: Total ?6 Click Score: 9 ? ?  ?End of Session   ? ?OT Visit Diagnosis: Other abnormalities of gait and mobility (R26.89);Unsteadiness on feet (R26.81);Muscle weakness (generalized) (M62.81);Feeding difficulties (R63.3);Other symptoms and signs involving the nervous system (R29.898);Other symptoms and signs involving cognitive function;Pain ?Pain - part of body:  (abdomen) ?  ?Activity Tolerance Patient limited by fatigue;Patient limited by pain ?  ?Patient Left in bed;with call bell/phone within reach ?  ?Nurse Communication Other (comment) (Pt needing pain medicine) ?  ? ?   ? ?Time: 2376-2831 ?OT Time Calculation (min): 50 min ? ?Charges: OT General Charges ?$OT Visit: 1 Visit ?OT Treatments ?$Self Care/Home Management : 23-37 mins ?$Therapeutic Exercise: 8-22 mins ? ? ?Regis Hinton OTR/L ?10/27/2021,  12:00 PM ?

## 2021-10-27 NOTE — Progress Notes (Signed)
Pt is Alert, appears to be in no distress, but complaining of abdominal pain 9/10. Pt requesting IV dilaudid.  ?

## 2021-10-27 NOTE — Plan of Care (Signed)

## 2021-10-27 NOTE — Progress Notes (Signed)
RN administered  10 mg of oxycodone via peg tube for pt's c/o abdominal pain 10/10.  ?

## 2021-10-27 NOTE — Progress Notes (Signed)
Patient AL:PFXTK Neuser      DOB: 1956/02/07      WIO:[id number] ? ? ? ?  ?Palliative Medicine Team ? ? ? ?Subjective: Bedside symptom check. No family or visitors present at time of visit. Patient participating with OT at time of visit. ? ? ?Physical exam: Patient with ongoing requests for IV pain medication for abdominal pain.  ? ? ?Assessment and plan: Discussed with attending MD Ghimire and bedside RN, Devin plan for ongoing pain control after PEG placement. RN endorses no difficultly with flushing PEG, little residual amounts at checks, and good output in rectal flexitube. MD believes this is expected post operative pain. Bedside RN without needs or concerns at this time. Will continue to follow for changes or advances. ? ? ?Thank you for allowing the Palliative Medicine Team to assist in the care of this patient. ?  ?  ?Shelda Jakes, MSN, RN ?Palliative Medicine Team ?Team Phone: [phone]  ?This phone is monitored 7a-7p, please reach out to attending physician outside of these hours for urgent needs.   ?

## 2021-10-27 NOTE — Progress Notes (Signed)
Patient expressed to Nursing staff that he was upset about not being able to havre pain medicine more often than ordered scheduled PRN dose. Education provided to patient regarding potential and harmful side effects that can occur. Will continue to educate patient as needed to ensure patient fully understands importance of medication safety. Patient appears to be alert and orientated.  ?

## 2021-10-27 NOTE — Care Management Important Message (Signed)
Important Message ? ?Patient Details  ?Name: Troy Sampson ?MRN: [id number] ?Date of Birth: 03/09/1956 ? ? ?Medicare Important Message Given:  Yes ? ? ? ? ?Nisreen Guise ?10/27/2021, 12:42 PM ?

## 2021-10-27 NOTE — Progress Notes (Signed)
Pt alert, no signs of distress, pt is c/o abdominal pain 8/10, pt requesting dilaudid. RN educated the pt that dilaudid is order Q 4hr and was last given at 00:48 next dose is due at 04:48. Pt requesting oxycodone for pain.  ?

## 2021-10-27 NOTE — Progress Notes (Signed)
Pt is alert, complaining of severe abdominal pain, IV dilaudid given.  ?

## 2021-10-27 NOTE — Progress Notes (Signed)
?PROGRESS NOTE ? ? ? ?Troy Sampson  BSJ:[id number] DOB: May 10, 1956 DOA: 09/21/2021 ?PCP: Pcp, No  ? ? ?Brief Narrative:  ?66 year old gentleman with history of duodenal ulcer, hepatitis C, iron deficiency anemia, smoker who presented to [facility] with 1 week of shortness of breath on 09/21/2021 where he was found to be hypoxic with oxygen 80, tachypneic with respiratory 40s, tachycardia.  He was intubated in the emergency room and sent to ICU.  On admission, noted temperature 101.8, left lower pneumonia on chest x-ray. ?Significant events: ? ?2/22, admitted overnight to ICU.  Blood cultures 3/4 positive for Streptococcus pneumonia.  Streptococcus pneumonia antigen positive in urine.  TEE with ejection fraction 50 to 55%, ultimately developed ongoing fever, antibiotics broadened to cefepime and vancomycin and then penicillin G. ?3/1, vasoactive drip requirements improving, 1 unit of PRBC for hemoglobin 6.1.  Appropriately responded to 6.8.  Received 2 more units of PRBC and 3 units of platelets since admission. ?3/3, EGD showed grade D esophagitis with bleeding. ?3/4, tracheostomy, now on trach collar ?3/18, another 1 unit of PRBC ?3/27, PEG tube placement. ? ? ?Assessment & Plan: ?  ?Acute hypoxemic and hypercapnic respiratory failure secondary to left lower lobe pneumonia, streptococcal bacteremia, septic shock: ?-Prolonged intubation and currently with tracheostomy, currently on trach collar.  He has cuffed trach in place and followed by pulmonary.  Can possibly downgrade to cuffless trach.  Continue aggressive pulmonary hygiene, respiratory therapy. ?-Dysphagia secondary to above, PEG tube placement on 3/27 and currently tolerating PEG tube feeding. ? ?Tracheostomy care, oxygen goal more than 90%, continue Pulmicort, Brovana and Yupelri, as needed bronchodilators.  Speech therapy following for Passy-Muir valve placement only with supervision. ? ?Acute blood loss anemia from upper GI bleeding with  esophagitis, anemia of critical illness, anemia of chronic disease: ?Patient received total 4 units of PRBC, hemoglobin is stable since then.  On Protonix. ? ?Essential hypertension: Blood pressure on goal.  Currently on amlodipine and hydrochlorothiazide. ? 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  No other evidence of metastatic disease.  Will need outpatient follow-up.  Will refer to GI after clinical improvement. ? ?Leukocytosis: Persistent leukocytosis.  Cause unknown.  Without fever.  If continue to complain of diarrhea and abdominal pain, will check C. difficile.  Repeat levels tomorrow morning. ? ?Hyperkalemia: With no explanation.  1 dose of Lokelma 10 mg today.  Recheck tomorrow morning. ? ? ?DVT prophylaxis: enoxaparin (LOVENOX) injection 30 mg Start: 10/25/21 1245 ?SCDs Start: 09/22/21 0044 ? ? ?Code Status: Partial, okay for intubation and mechanical ventilation but no chest compression. ?Family Communication: None at the bedside. ?Disposition Plan: Status is: Inpatient ?Remains inpatient appropriate because: Tracheostomy and PEG tube care.  Will need long-term placement. ?  ? ? ?Consultants:  ?PCCM ? ?Procedures:  ?Multiple procedures as above. ? ?Antimicrobials:  ?Completed antibiotic therapy. ? ? ?Subjective: ?Patient seen and examined.  He was having some abdominal pain at the PEG tube insertion site.  Denies any nausea or vomiting.  He is NPO.  PEG tube feeding is infusing.  He was happy to be able to use Passy-Muir valve and have some conversation yesterday.  Has some cough.  Afebrile.  On trach collar. ? ?Objective: ?Vitals:  ? 10/27/21 0315 10/27/21 0335 10/27/21 7654 10/27/21 0816  ?BP: 124/79  133/84 137/89  ?Pulse: 93  91 87  ?Resp: 15  16 17   ?Temp: 98.3 ?F (36.8 ?C)     ?  TempSrc: Oral     ?SpO2: 96%  98% 98%  ?Weight:  48.1 kg    ?Height:      ? ? ?Intake/Output Summary (Last 24 hours) at 10/27/2021 1328 ?Last data  filed at 10/27/2021 0501 ?Gross per 24 hour  ?Intake 180 ml  ?Output 1000 ml  ?Net -820 ml  ? ?Filed Weights  ? 10/25/21 0500 10/26/21 0500 10/27/21 0335  ?Weight: 43.5 kg 43.5 kg 48.1 kg  ? ? ?Examination: ? ?General exam: Appears calm and comfortable  ?Frail and debilitated.  Chronically sick looking.  Not on any distress. ?Janina Mayo site is clean, on trach collar. ?Respiratory system: Conducted upper airway sounds.  Bilateral clear. ?Cardiovascular system: S1 & S2 heard, RRR.  No edema. ?Gastrointestinal system: Soft.  Mild tenderness along the PEG tube insertion site.  No residual guarding.  Bowel sound present. ?Central nervous system: Alert and oriented. No focal neurological deficits.  Can move all extremities. ? ? ? ?Data Reviewed: I have personally reviewed following labs and imaging studies ? ?CBC: ?Recent Labs  ?Lab 10/22/21 ?0500 10/23/21 ?5093 10/24/21 ?2671 10/26/21 ?0606 10/27/21 ?2458  ?WBC 22.1* 20.3* 22.4* 23.1* 25.9*  ?HGB 8.2* 7.6* 8.6* 9.9* 9.5*  ?HCT 25.8* 23.2* 27.4* 31.5* 29.4*  ?MCV 84.9 85.3 86.4 85.4 85.7  ?PLT 259 246 280 334 239  ? ?Basic Metabolic Panel: ?Recent Labs  ?Lab 10/21/21 ?0998 10/22/21 ?0500 10/24/21 ?3382 10/26/21 ?0606 10/27/21 ?5053  ?NA 140 135 133* 130* 130*  ?K 4.9 4.6 4.7 4.4 5.4*  ?CL 106 101 100 99 99  ?CO2 31 30 29 25 23   ?GLUCOSE 151* 94 92 99 137*  ?BUN 25* 27* 23 22 32*  ?CREATININE 0.33* 0.35* 0.32* 0.31* 0.39*  ?CALCIUM 8.4* 8.6* 8.6* 8.8* 8.6*  ?MG 1.9  --  1.6*  --   --   ?PHOS 3.6  --   --   --   --   ? ?GFR: ?Estimated Creatinine Clearance: 62.6 mL/min (A) (by C-G formula based on SCr of 0.39 mg/dL (L)). ?Liver Function Tests: ?Recent Labs  ?Lab 10/22/21 ?0500  ?AST 93*  ?ALT 87*  ?ALKPHOS 197*  ?BILITOT 0.7  ?PROT 6.9  ?ALBUMIN <1.5*  ? ?No results for input(s): LIPASE, AMYLASE in the last 168 hours. ?No results for input(s): AMMONIA in the last 168 hours. ?Coagulation Profile: ?No results for input(s): INR, PROTIME in the last 168 hours. ?Cardiac Enzymes: ?No  results for input(s): CKTOTAL, CKMB, CKMBINDEX, TROPONINI in the last 168 hours. ?BNP (last 3 results) ?No results for input(s): PROBNP in the last 8760 hours. ?HbA1C: ?No results for input(s): HGBA1C in the last 72 hours. ?CBG: ?Recent Labs  ?Lab 10/26/21 ?2010 10/27/21 ?0009 10/27/21 ?0315 10/27/21 ?0815 10/27/21 ?1129  ?GLUCAP 135* 141* 125* 111* 113*  ? ?Lipid Profile: ?No results for input(s): CHOL, HDL, LDLCALC, TRIG, CHOLHDL, LDLDIRECT in the last 72 hours. ?Thyroid Function Tests: ?No results for input(s): TSH, T4TOTAL, FREET4, T3FREE, THYROIDAB in the last 72 hours. ?Anemia Panel: ?No results for input(s): VITAMINB12, FOLATE, FERRITIN, TIBC, IRON, RETICCTPCT in the last 72 hours. ?Sepsis Labs: ?No results for input(s): PROCALCITON, LATICACIDVEN in the last 168 hours. ? ?Recent Results (from the past 240 hour(s))  ?Body fluid culture w Gram Stain     Status: None  ? Collection Time: 10/18/21 12:15 PM  ? Specimen: Synovial Fluid  ?Result Value Ref Range Status  ? Specimen Description FLUID  Final  ? Special Requests NONE  Final  ? Gram Stain  Final  ?  ABUNDANT WBC PRESENT, PREDOMINANTLY PMN ?NO ORGANISMS SEEN ?  ? Culture   Final  ?  NO GROWTH 3 DAYS ?Performed at [facility], [address] [place], [place] [address] ?  ? Report Status 10/21/2021 FINAL  Final  ?MRSA Next Gen by PCR, Nasal     Status: None  ? Collection Time: 10/22/21 11:27 AM  ? Specimen: Nasal Mucosa; Nasal Swab  ?Result Value Ref Range Status  ? MRSA by PCR Next Gen NOT DETECTED NOT DETECTED Final  ?  Comment: (NOTE) ?The GeneXpert MRSA Assay (FDA approved for NASAL specimens only), ?is one component of a comprehensive MRSA colonization surveillance ?program. It is not intended to diagnose MRSA infection nor to guide ?or monitor treatment for MRSA infections. ?Test performance is not FDA approved in patients less than 2 years ?old. ?Performed at [facility], [address] [place], [place] ?[id number] ?  ?Culture,  Respiratory w Gram Stain     Status: None  ? Collection Time: 10/22/21  2:20 PM  ? Specimen: Tracheal Aspirate; Respiratory  ?Result Value Ref Range Status  ? Specimen Description TRACHEAL ASPIRATE  Final  ? Special Requ

## 2021-10-28 DIAGNOSIS — J9601 Acute respiratory failure with hypoxia: Secondary | ICD-10-CM

## 2021-10-28 LAB — BASIC METABOLIC PANEL
Anion gap: 6 (ref 5–15)
BUN: 33 mg/dL — ABNORMAL HIGH (ref 8–23)
CO2: 28 mmol/L (ref 22–32)
Calcium: 8.7 mg/dL — ABNORMAL LOW (ref 8.9–10.3)
Chloride: 97 mmol/L — ABNORMAL LOW (ref 98–111)
Creatinine, Ser: <0.3 mg/dL — ABNORMAL LOW (ref 0.61–1.24)
Glucose, Bld: 123 mg/dL — ABNORMAL HIGH (ref 70–99)
Potassium: 4.3 mmol/L (ref 3.5–5.1)
Sodium: 131 mmol/L — ABNORMAL LOW (ref 135–145)

## 2021-10-28 LAB — CBC WITH DIFFERENTIAL/PLATELET
Abs Immature Granulocytes: 0.16 10*3/uL — ABNORMAL HIGH (ref 0.00–0.07)
Basophils Absolute: 0 10*3/uL (ref 0.0–0.1)
Basophils Relative: 0 %
Eosinophils Absolute: 0.1 10*3/uL (ref 0.0–0.5)
Eosinophils Relative: 0 %
HCT: 27.4 % — ABNORMAL LOW (ref 39.0–52.0)
Hemoglobin: 8.9 g/dL — ABNORMAL LOW (ref 13.0–17.0)
Immature Granulocytes: 1 %
Lymphocytes Relative: 3 %
Lymphs Abs: 0.7 10*3/uL (ref 0.7–4.0)
MCH: 28.3 pg (ref 26.0–34.0)
MCHC: 32.5 g/dL (ref 30.0–36.0)
MCV: 87 fL (ref 80.0–100.0)
Monocytes Absolute: 1.3 10*3/uL — ABNORMAL HIGH (ref 0.1–1.0)
Monocytes Relative: 6 %
Neutro Abs: 20.3 10*3/uL — ABNORMAL HIGH (ref 1.7–7.7)
Neutrophils Relative %: 90 %
Platelets: 305 10*3/uL (ref 150–400)
RBC: 3.15 MIL/uL — ABNORMAL LOW (ref 4.22–5.81)
RDW: 20 % — ABNORMAL HIGH (ref 11.5–15.5)
WBC: 22.6 10*3/uL — ABNORMAL HIGH (ref 4.0–10.5)
nRBC: 0 % (ref 0.0–0.2)

## 2021-10-28 LAB — GLUCOSE, CAPILLARY
Glucose-Capillary: 100 mg/dL — ABNORMAL HIGH (ref 70–99)
Glucose-Capillary: 112 mg/dL — ABNORMAL HIGH (ref 70–99)
Glucose-Capillary: 117 mg/dL — ABNORMAL HIGH (ref 70–99)
Glucose-Capillary: 129 mg/dL — ABNORMAL HIGH (ref 70–99)
Glucose-Capillary: 131 mg/dL — ABNORMAL HIGH (ref 70–99)
Glucose-Capillary: 132 mg/dL — ABNORMAL HIGH (ref 70–99)

## 2021-10-28 LAB — C DIFFICILE (CDIFF) QUICK SCRN (NO PCR REFLEX)
C Diff antigen: NEGATIVE
C Diff interpretation: NOT DETECTED
C Diff toxin: NEGATIVE

## 2021-10-28 LAB — MAGNESIUM: Magnesium: 1.6 mg/dL — ABNORMAL LOW (ref 1.7–2.4)

## 2021-10-28 LAB — PHOSPHORUS: Phosphorus: 2.9 mg/dL (ref 2.5–4.6)

## 2021-10-28 MED ORDER — MAGNESIUM SULFATE 2 GM/50ML IV SOLN
2.0000 g | Freq: Once | INTRAVENOUS | Status: AC
  Administered 2021-10-28: 2 g via INTRAVENOUS
  Filled 2021-10-28: qty 50

## 2021-10-28 NOTE — Progress Notes (Signed)
Pt tracheostomy tube was changed to a Shiley Flex 68mm Uncuffed with no complications. Good color change on Co2 detector and bilateral breath sounds were heard. Pt is currently stable sat's of 95%. RT will continue to monitor for any changes. ?

## 2021-10-28 NOTE — TOC Progression Note (Signed)
Transition of Care (TOC) - Progression Note  ? ? ?Patient Details  ?Name: Troy Sampson ?MRN: [id number] ?Date of Birth: [birth year] ? ?Transition of Care (TOC) CM/SW Contact  ?Cory Kitt Aris Lot, LCSW ?Phone Number: ?10/28/2021, 3:37 PM ? ?Clinical Narrative:    ? ?CSW called pt daughter to discuss disposition. Daughter states that she is waiting on a call back from the [place] to see if pt has any benefits there. She also states that she has been in contact with medicare and is waiting on instructions on what she needs to do to secure part B for pt. She states that because pt does not her arranged as a POA(or anyone at all) that this has been a barrier for her in navigating pt's benefits/insurance.  ? ?CSW explained that [facility] is willing to accept pt though that medicare part A will cover his stay there but will not cover the physicians who provide services to the pt. [facility] would still be willing to accept pt with just part A but pt's family would need to understand and be agreeable for pt to receive bills for services provided by the physicians when at [facility]. CSW explained that pt would be stable to go to [facility] and that [facility] would not be able not justify holding pt here and billing insurance for his current acute hospital stay. CSW explained that in this case, pt may end up getting billed for hospital stay since it cannot be justified medically. CSW explained that the hospital cannot hold pt and wait for pt to eventually get part B. Daughter expressed understanding of this but still wants to reach out to medicare to find out more. She states she is going to call medicare "right now" and call CSW "right back." Daughter seems to have more urgency regarding arranging part B though a decision regarding [facility]ACH will still be needed ASAP whether medicare part B is arranged or not. TOC will continue to follow.  ? ?Expected Discharge Plan: Long Term Acute Care (LTAC) ?  ? ?Expected Discharge Plan and Services ?Expected  Discharge Plan: Long Term Acute Care (LTAC) ?  ?  ?  ?  ?                ?  ?  ?  ?  ?  ?  ?  ?  ?  ?  ? ? ?Social Determinants of Health (SDOH) Interventions ?Alcohol Brief Interventions/Follow-up: Patient Refused ? ?Readmission Risk Interventions ?   ? View : No data to display.  ?  ?  ?  ? ? ?

## 2021-10-28 NOTE — Progress Notes (Signed)
Patient [id number] Holroyd      DOB: 06/01/1956      [id number] ? ? ? ?  ?Palliative Medicine Team ? ? ? ?Subjective: Bedside symptom check. No family or visitors present at this time. PT/OT entering room when this RN visited.  ? ? ?Physical exam: Patient comfortable appearing, resting in bed with eyes open. Patient able to maintain eye contact throughout conversation, mouthing words over trach collar to communicate. No physical or non-verbal signs of pain noted. Breathing even and non-labored, supported by trach and trach collar.  ? ? ?Assessment and plan: This RN introduced self and role within his care on the PMT. Patient without any concerns at this time. Bedside RN without any needs or concerns at this time. Did endorse that he calls out for pain medications frequently but never shows physical signs of pain when he does request them. Requiring less pain medication this morning compared to last night. RN endorses proper functioning of PEG and rectal flexitube at this time. Will continue to follow for changes or advances.  ? ? ?Thank you for allowing the Palliative Medicine Team to assist in the care of this patient. ?  ?  ?Damian Leavell, MSN, RN ?Palliative Medicine Team ?Team Phone: [phone]  ?This phone is monitored 7a-7p, please reach out to attending physician outside of these hours for urgent needs.   ?

## 2021-10-28 NOTE — Progress Notes (Signed)
SLP Cancellation Note ? ?Patient Details ?Name: Troy Sampson ?MRN: [id number] ?DOB: [birth year] ? ? ?Cancelled treatment:       Reason Eval/Treat Not Completed: Patient declined, no reason specified;Other (comment) (Patient declined as he is tired and wants to sleep. SLP will attempt to f/u later this PM.) ? ?Angela Nevin, MA, CCC-SLP ?Speech Therapy ? ?

## 2021-10-28 NOTE — Progress Notes (Signed)
SLP Cancellation Note ? ?Patient Details ?Name: Troy Sampson ?MRN: [id number] ?DOB: Sep 09, 1955 ? ? ?Cancelled treatment:       Reason Eval/Treat Not Completed: Patient declined, no reason specified. SLP returned in PM to attempt PMV tx however patient declined. Of note, he had trach downsized to a #6 cuffless today. SLP will attempt to f/u next date. ? ?Angela Nevin, MA, CCC-SLP ?Speech Therapy ? ?

## 2021-10-28 NOTE — Progress Notes (Signed)
? ?NAME:  Troy Sampson, MRN:  [id number], DOB:  11/03/1955, LOS: 36 ?ADMISSION DATE:  09/21/2021, CONSULTATION DATE:  09/22/2021 ?REFERRING MD:  [facility], CHIEF COMPLAINT:  Shortness of Breath  ? ?History of Present Illness:  ?66 y/o male with a PMHx of duodenal ulcer, IDA, nephrolithiasis and a current smoker who presented from [facility] with c/o SOB x 1 week. History obtained by his wife due to patient arrived obtunded, markedly hypoxic oxygen sats in the low 80s. Patient placed on BiPAP and given Ativan. He was fairly tachypnis with RR in 40s as well as tachycardia and was ultimately intubated for acute hypoxic respiratory failure and increased work of breathing. Noted to have a fever of 101.8. CXR show a LLL pneumonia. PCCM consulted for ICU admission.  ? 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 ?Significant Hospital Events: ?Including procedures, antibiotic start and stop dates in addition to other pertinent events   ?2/22: Admitted overnight to ICU. Blood cultures 3/4 positive for strep pneumo. Strep pneumo antigen positive, TTE with EF 50 to 55% no wall motion abnormalities ?2/26: New onset fevers. Antibiotics broadened to Cefepime and Vancomycin. Propofol switched to Precedex.  ?2/27: Bradycardia noted on Precedex with increased tachypnea.  ?2/28, antibiotics changed to penicillin G per infectious disease recommendation ?3/1: Vasoactive drip requirements improving, received 1 unit of blood for hemoglobin of 6.1 ?3/2 follow-up hemoglobin 6.8, received second unit of blood, has also received 3 units of platelets since admission.  Off norepinephrine  ?3/3 EGD> grade D esophagitis with bleeding ?3/4 tracheostomy ?3/7 ongoing tachypnea limiting SBT. Unable to SBT at all this morning. Overnight episode of bradycardia when being turned requiring atropine ?3/15 palliative consulted, made limited code with no CPR ?3/17 ongoing palliative care  discussions ?3/18 Transfused 1 unit PRBC for low hemoglobin with no active bleed ?3/24 IR consult for PEG placed by interventional radiology on 10/25/2021 ?10/28/2021 downsized from #8 to a #6 cuffless trach ? ?Interim History / Subjective:  ?Somewhat lethargic but otherwise unremarked ? ?Objective   ?Blood pressure (!) 149/76, pulse 92, temperature 98.3 ?F (36.8 ?C), temperature source Axillary, resp. rate 15, height 5\' 7"  (1.702 m), weight 47.2 kg, SpO2 94 %. ?   ?FiO2 (%):  [35 %-40 %] 35 %  ? ?Intake/Output Summary (Last 24 hours) at 10/28/2021 1028 ?Last data filed at 10/28/2021 0100 ?Gross per 24 hour  ?Intake 2880 ml  ?Output 400 ml  ?Net 2480 ml  ? ?Filed Weights  ? 10/26/21 0500 10/27/21 0335 10/28/21 0408  ?Weight: 43.5 kg 48.1 kg 47.2 kg  ? ?Physical exam: ? ?General:  frail male in no acute distress ?HEENT: MM pink/moist #8 cuffed tracheostomy clean dry and intact with good air movement ?Neuro: Awake and follows command ?CV: Heart sounds are regular ?PULM: Decreased breath sounds in the bases ?8 L trach collar ? ?GI: soft, bsx4 active PEG is in place ? ?Extremities: warm/dry, 1+ edema  ?Skin: no rashes or lesions ? ? ?Resolved Hospital Problem list   ?Septic shock, Lactic acidosis, Thrombocytopenia, Type 2 MI, Acute urinary retention, Aspiration pneumonia, Right knee pseudogout ? ?Assessment & Plan:  ?Acute hypoxic/hypercapnic respiratory failure. ?Failure to wean s/p tracheostomy. ?Presumed COPD. ?Tached 3/4 with #8 cuffed ?3/30 downsize to 6 cuffless ?Repeat swallow eval ? ? ? ?ICU.   ?- trach care ?- goal SpO2 > 92% ?- continue pulmicort, brovana, yupelri ?- prn albuterol ?- Passy Muir valve with SLP ? ?  ABLA from Upper GI bleeding with esophagitis. ?Anemia of critical illness, iron deficiency and chronic disease. ?Recent Labs  ?  10/27/21 ?0227 10/28/21 ?2952  ?HGB 9.5* 8.9*  ? ? ?Transfuse per protocol ? ?HTN, HLD. ?Goals BP normotensive ?Per primary ? ?Liver mass, Hepatitis C. ?Three solitary liver  masses suspicious for hepatocellular carcinoma. No other metastatic disease. Not a candidate for biopsy given nutrition status. ?Opt fu ? ?Severe protein calorie malnutrition, cachexia ?Per peg ? ?Best Practice (right click and "Reselect all SmartList Selections" daily)  ? ?Diet/type: tubefeeds ?DVT prophylaxis: lovenox ?GI prophylaxis: PPI, H2 blocker ?Lines: N/A ?Foley:  N/A ?Code Status:  Partial. No CPR ?Last date of multidisciplinary goals of care discussion: per primary ? ?Labs:  ? ? ?  Latest Ref Rng & Units 10/28/2021  ?  3:39 AM 10/27/2021  ?  2:27 AM 10/26/2021  ?  6:06 AM  ?CMP  ?Glucose 70 - 99 mg/dL 841   324   99    ?BUN 8 - 23 mg/dL 33   32   22    ?Creatinine 0.61 - 1.24 mg/dL <4.01   0.27   2.53    ?Sodium 135 - 145 mmol/L 131   130   130    ?Potassium 3.5 - 5.1 mmol/L 4.3   5.4   4.4    ?Chloride 98 - 111 mmol/L 97   99   99    ?CO2 22 - 32 mmol/L 28   23   25     ?Calcium 8.9 - 10.3 mg/dL 8.7   8.6   8.8    ? ? ? ?  Latest Ref Rng & Units 10/28/2021  ?  3:39 AM 10/27/2021  ?  2:27 AM 10/26/2021  ?  6:06 AM  ?CBC  ?WBC 4.0 - 10.5 K/uL 22.6   25.9   23.1    ?Hemoglobin 13.0 - 17.0 g/dL 8.9   9.5   9.9    ?Hematocrit 39.0 - 52.0 % 27.4   29.4   31.5    ?Platelets 150 - 400 K/uL 305   239   334    ? ? ?ABG ?   ?Component Value Date/Time  ? PHART 7.498 (H) 10/09/2021 0106  ? PCO2ART 43.4 10/09/2021 0106  ? PO2ART 64 (L) 10/09/2021 0106  ? HCO3 33.8 (H) 10/09/2021 0106  ? TCO2 35 (H) 10/09/2021 0106  ? ACIDBASEDEF 1.0 09/28/2021 0405  ? O2SAT 94 10/09/2021 0106  ? ? ?CBG (last 3)  ?Recent Labs  ?  10/28/21 ?0020 10/28/21 ?0330 10/28/21 ?0829  ?GLUCAP 100* 131* 112*  ? ? ?Signature:  ? ?10/30/21 Shanasia Ibrahim ACNP ?Acute Care Nurse Practitioner ?[facility] ?Please consult Amion ?10/28/2021, 10:28 AM ? ? ? ?

## 2021-10-28 NOTE — Progress Notes (Signed)
Physical Therapy Treatment ?Patient Details ?Name: Troy Sampson ?MRN: [id number] ?DOB: May 15, 1956 ?Today's Date: 10/28/2021 ? ? ?History of Present Illness Pt is a 66 y/o male who presented to the ED 2/22 with SOB. After arrival, pt became obtunded due to severe hypoxia and subsequently intubated emergently. Pt admitted with dx of acute hypoxic/hypercapnic respiratory failure 2nd to community acquired pneumonia.  Further testing revealed Hep C, suspected COPD, and potential underlying malignancy. Pt underwent tracheostomy 3/4. PMHx: duodenal ulcer, IDA, nephrolithiasis, ETOH ? ?  ?PT Comments  ? ? Patient really wanting to take a nap after a rough night, but agreed to work with PT. Did not want to be lifted to chair but agreed to sitting EOB. Patient is trying to assist with mobility, however is very weak and needs increased time to recover his breathing between tasks (even rolling from one side to another). Max RR 40 with sats decreasing to 86% while sitting EOB. Return to supine with RR 22 and sats 96% on trach collar.  ?   ?Recommendations for follow up therapy are one component of a multi-disciplinary discharge planning process, led by the attending physician.  Recommendations may be updated based on patient status, additional functional criteria and insurance authorization. ? ?Follow Up Recommendations ? Skilled nursing-short term rehab (<3 hours/day) ?  ?  ?Assistance Recommended at Discharge Frequent or constant Supervision/Assistance  ?Patient can return home with the following Two people to help with walking and/or transfers;Two people to help with bathing/dressing/bathroom;Assistance with cooking/housework;Assistance with feeding;Direct supervision/assist for medications management;Direct supervision/assist for financial management;Assist for transportation;Help with stairs or ramp for entrance;Other (comment) ?  ?Equipment Recommendations ? Hospital bed;BSC/3in1;Wheelchair (measurements PT);Wheelchair cushion  (measurements PT) (hoyer lift)  ?  ?Recommendations for Other Services   ? ? ?  ?Precautions / Restrictions Precautions ?Precautions: Fall;Other (comment) ?Precaution Comments: trach collar, flexiseal ?Restrictions ?Weight Bearing Restrictions: No  ?  ? ?Mobility ? Bed Mobility ?Overal bed mobility: Needs Assistance ?Bed Mobility: Rolling, Sidelying to Sit, Sit to Sidelying ?Rolling: Max assist ?Sidelying to sit: +2 for physical assistance, +2 for safety/equipment, HOB elevated, Max assist ?  ?  ?Sit to sidelying: Max assist, +2 for physical assistance, HOB elevated ?General bed mobility comments: Patient able to bend LEs with assist and reaching with appropriate UE to assist. +2 total to scoot to [facility] ?  ? ?Transfers ?  ?  ?  ?  ?  ?  ?  ?  ?  ?General transfer comment: pt requesting to not get in the chair as he had a rough night and was hoping to get a nap. Also states he was left up in the chair x 2.5 hours last time and was uncomfortable. ?  ? ?Ambulation/Gait ?  ?  ?  ?  ?  ?  ?  ?General Gait Details: unable ? ? ?Stairs ?  ?  ?  ?  ?  ? ? ?Wheelchair Mobility ?  ? ?Modified Rankin (Stroke Patients Only) ?  ? ? ?  ?Balance Overall balance assessment: Needs assistance ?Sitting-balance support: Bilateral upper extremity supported, Feet unsupported ?Sitting balance-Leahy Scale: Poor ?Sitting balance - Comments: Able to maintain balance with bil UE support with minguard asssist in forward flexed posture. ?Postural control: Posterior lean, Right lateral lean ?  ?  ?  ?  ?  ?  ?  ?  ?  ?  ?  ?  ?  ?  ?  ? ?  ?Cognition Arousal/Alertness: Awake/alert ?Behavior During Therapy: Flat affect ?  Overall Cognitive Status: Difficult to assess (Secondary to trach) ?Area of Impairment: Following commands ?  ?  ?  ?  ?  ?  ?  ?  ?  ?Current Attention Level: Select[facility] ?  ?Following Commands: Follows one step commands with increased time ?  ?  ?  ?General Comments: alert but flat affect; lipspeaking as unsure if PMV is ok to be  used during all therapies (did reach out to SLP for clarification) ?  ?  ? ?  ?Exercises   ? ?  ?General Comments General comments (skin integrity, edema, etc.): Sitting EOB pt using accessory muscles for respiration with sats decreasing to 86% on trach collar. RR 40. Pt returned to bed after ~3 minutes. ?  ?  ? ?Pertinent Vitals/Pain Pain Assessment ?Pain Assessment: Faces ?Faces Pain Scale: Hurts whole lot ?Pain Location: left lower leg ?Pain Descriptors / Indicators: Sore, Grimacing, Guarding ?Pain Intervention(s): Limited activity within patient's tolerance, Monitored during session  ? ? ?Home Living   ?  ?  ?  ?  ?  ?  ?  ?  ?  ?   ?  ?Prior Function    ?  ?  ?   ? ?PT Goals (current goals can now be found in the care plan section) Acute Rehab PT Goals ?Patient Stated Goal: unable to state ?Time For Goal Achievement: 11/01/21 ?Potential to Achieve Goals: Poor ?Progress towards PT goals: Progressing toward goals ? ?  ?Frequency ? ? ? Min 3X/week ? ? ? ?  ?PT Plan Discharge plan needs to be updated  ? ? ?Co-evaluation   ?  ?  ?  ?  ? ?  ?AM-PAC PT "6 Clicks" Mobility   ?Outcome Measure ? Help needed turning from your back to your side while in a flat bed without using bedrails?: Total ?Help needed moving from lying on your back to sitting on the side of a flat bed without using bedrails?: Total ?Help needed moving to and from a bed to a chair (including a wheelchair)?: Total ?Help needed standing up from a chair using your arms (e.g., wheelchair or bedside chair)?: Total ?Help needed to walk in hospital room?: Total ?Help needed climbing 3-5 steps with a railing? : Total ?6 Click Score: 6 ? ?  ?End of Session Equipment Utilized During Treatment: Oxygen (trach collar) ?Activity Tolerance: Patient limited by fatigue;Treatment limited secondary to medical complications (Comment) (RR 40 with sats down to 86%) ?Patient left: with call bell/phone within reach;in bed;with bed alarm set ?Nurse Communication: Mobility  status;Need for lift equipment ?PT Visit Diagnosis: Other abnormalities of gait and mobility (R26.89) ?  ? ? ?Time: 7322-0254 ?PT Time Calculation (min) (ACUTE ONLY): 23 min ? ?Charges:  $Therapeutic Activity: 23-37 mins          ?          ? ? ?Jerolyn Center, PT ?Acute Rehabilitation Services  ?Pager [phone] ?Office [phone] ? ? ? ?Troy Sampson ?10/28/2021, 12:17 PM ? ?

## 2021-10-28 NOTE — Progress Notes (Signed)
?PROGRESS NOTE ? ? ? ?Christie Copley  JQZ:[id number] DOB: 22-Jul-1956 DOA: 09/21/2021 ?PCP: Pcp, No  ? ? ?Brief Narrative:  ?66 year old gentleman with history of duodenal ulcer, hepatitis C, iron deficiency anemia, smoker who presented to med [facility] with 1 week of shortness of breath on 09/21/2021 where he was found to be hypoxic with oxygen 80, tachypneic with respiratory 40s, tachycardia.  He was intubated in the emergency room and sent to ICU.  On admission, noted temperature 101.8, left lower pneumonia on chest x-ray. ? ?Significant events: ?2/22, admitted overnight to ICU.  Blood cultures 3/4 positive for Streptococcus pneumonia.  Streptococcus pneumonia antigen positive in urine.  TEE with ejection fraction 50 to 55%, ultimately developed ongoing fever, antibiotics broadened to cefepime and vancomycin and then penicillin G. ?3/1, vasoactive drip requirements improving, 1 unit of PRBC for hemoglobin 6.1.  Appropriately responded to 6.8.  Received 2 more units of PRBC and 3 units of platelets since admission. ?3/3, EGD showed grade D esophagitis with bleeding. ?3/4, tracheostomy, now on trach collar ?3/18, another 1 unit of PRBC ?3/27, PEG tube placement. ? ? ?Assessment & Plan: ?  ?Acute hypoxemic and hypercapnic respiratory failure secondary to left lower lobe pneumonia, streptococcal bacteremia, septic shock: ?-Prolonged intubation and currently with tracheostomy, currently on trach collar.  He has cuffed trach in place and followed by pulmonary.  Can possibly downgrade to cuffless trach.  Continue aggressive pulmonary hygiene, respiratory therapy. ?-Dysphagia secondary to above, PEG tube placement on 3/27 and currently tolerating PEG tube feeding. ? ?Tracheostomy care, oxygen goal more than 90%, continue Pulmicort, Brovana and Yupelri, as needed bronchodilators.  Speech therapy following for Passy-Muir valve placement only with supervision. ? ?Acute blood loss anemia from upper GI bleeding with  esophagitis, anemia of critical illness, anemia of chronic disease: ?Patient received total 4 units of PRBC, hemoglobin is stable since then.  On Protonix. ? ?Essential hypertension: Blood pressure on goal.  Currently on amlodipine and hydrochlorothiazide. ? 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  No other evidence of metastatic disease.  Will need outpatient follow-up.  Will refer to GI after clinical improvement. ? ?Leukocytosis: Persistent leukocytosis.  Cause unknown.  Without fever.  Patient continues to complain of abdominal pain and has persistent leukocytosis.  No recent steroid use. ?We will check C. difficile with persistent diarrhea. ? ?Hyperkalemia: Improved with Lokelma.  We will continue to monitor. ? ?Hypomagnesemia: Replace magnesium today. ? ? ?DVT prophylaxis: enoxaparin (LOVENOX) injection 30 mg Start: 10/25/21 1245 ?SCDs Start: 09/22/21 0044 ? ? ?Code Status: Partial, okay for intubation and mechanical ventilation but no chest compression. ?Family Communication: None at the bedside. ?Disposition Plan: Status is: Inpatient ?Remains inpatient appropriate because: Tracheostomy and PEG tube care.  Will need long-term placement. ?  ? ? ?Consultants:  ?PCCM ? ?Procedures:  ?Multiple procedures as above. ? ?Antimicrobials:  ?Completed antibiotic therapy. ? ? ?Subjective: ? ?Seen and examined.  Denies any complaints.  He did complain about ongoing abdominal pain with the nursing staff last night and not having adequate IV pain medications.  Denies any nausea or vomiting.  Tolerating tube feeding.  He tells me diffuse pain around the PEG tube site.  Site looks clean and dry. ? ?Objective: ?Vitals:  ? 10/28/21 0831 10/28/21 6226 10/28/21 3335 10/28/21 0847  ?BP: (!) 149/76  (!) 149/76   ?Pulse: 93  92   ?Resp: 20  15   ?Temp:      ?  TempSrc:      ?SpO2: 95% 94% 94% 94%  ?Weight:      ?Height:      ? ? ?Intake/Output Summary (Last 24  hours) at 10/28/2021 0959 ?Last data filed at 10/28/2021 0100 ?Gross per 24 hour  ?Intake 2880 ml  ?Output 400 ml  ?Net 2480 ml  ? ?Filed Weights  ? 10/26/21 0500 10/27/21 0335 10/28/21 0408  ?Weight: 43.5 kg 48.1 kg 47.2 kg  ? ? ?Examination: ? ?General exam: Appears calm and comfortable  ?Frail and debilitated.  Chronically sick looking.  Not on any distress. ?Janina Mayo site is clean, on trach collar. ?Respiratory system: Conducted upper airway sounds.  Poor air entry at bases. ?Cardiovascular system: S1 & S2 heard, RRR.  No edema. ?Gastrointestinal system: Soft.  Mild tenderness along the PEG tube insertion site.  No residual guarding.  Bowel sound present. ?Central nervous system: Alert and oriented. No focal neurological deficits.  Can move all extremities. ? ? ? ?Data Reviewed: I have personally reviewed following labs and imaging studies ? ?CBC: ?Recent Labs  ?Lab 10/23/21 ?0419 10/24/21 ?0312 10/26/21 ?0606 10/27/21 ?6759 10/28/21 ?1638  ?WBC 20.3* 22.4* 23.1* 25.9* 22.6*  ?NEUTROABS  --   --   --   --  20.3*  ?HGB 7.6* 8.6* 9.9* 9.5* 8.9*  ?HCT 23.2* 27.4* 31.5* 29.4* 27.4*  ?MCV 85.3 86.4 85.4 85.7 87.0  ?PLT 246 280 334 239 305  ? ?Basic Metabolic Panel: ?Recent Labs  ?Lab 10/22/21 ?0500 10/24/21 ?4665 10/26/21 ?0606 10/27/21 ?9935 10/28/21 ?7017  ?NA 135 133* 130* 130* 131*  ?K 4.6 4.7 4.4 5.4* 4.3  ?CL 101 100 99 99 97*  ?CO2 30 29 25 23 28   ?GLUCOSE 94 92 99 137* 123*  ?BUN 27* 23 22 32* 33*  ?CREATININE 0.35* 0.32* 0.31* 0.39* <0.30*  ?CALCIUM 8.6* 8.6* 8.8* 8.6* 8.7*  ?MG  --  1.6*  --   --  1.6*  ?PHOS  --   --   --   --  2.9  ? ?GFR: ?CrCl cannot be calculated (This lab value cannot be used to calculate CrCl because it is not a number: <0.30). ?Liver Function Tests: ?Recent Labs  ?Lab 10/22/21 ?0500  ?AST 93*  ?ALT 87*  ?ALKPHOS 197*  ?BILITOT 0.7  ?PROT 6.9  ?ALBUMIN <1.5*  ? ?No results for input(s): LIPASE, AMYLASE in the last 168 hours. ?No results for input(s): AMMONIA in the last 168  hours. ?Coagulation Profile: ?No results for input(s): INR, PROTIME in the last 168 hours. ?Cardiac Enzymes: ?No results for input(s): CKTOTAL, CKMB, CKMBINDEX, TROPONINI in the last 168 hours. ?BNP (last 3 results) ?No results for input(s): PROBNP in the last 8760 hours. ?HbA1C: ?No results for input(s): HGBA1C in the last 72 hours. ?CBG: ?Recent Labs  ?Lab 10/27/21 ?1548 10/27/21 ?2056 10/28/21 ?0020 10/28/21 ?0330 10/28/21 ?0829  ?GLUCAP 113* 131* 100* 131* 112*  ? ?Lipid Profile: ?No results for input(s): CHOL, HDL, LDLCALC, TRIG, CHOLHDL, LDLDIRECT in the last 72 hours. ?Thyroid Function Tests: ?No results for input(s): TSH, T4TOTAL, FREET4, T3FREE, THYROIDAB in the last 72 hours. ?Anemia Panel: ?No results for input(s): VITAMINB12, FOLATE, FERRITIN, TIBC, IRON, RETICCTPCT in the last 72 hours. ?Sepsis Labs: ?No results for input(s): PROCALCITON, LATICACIDVEN in the last 168 hours. ? ?Recent Results (from the past 240 hour(s))  ?Body fluid culture w Gram Stain     Status: None  ? Collection Time: 10/18/21 12:15 PM  ? Specimen: Synovial Fluid  ?Result Value Ref  Range Status  ? Specimen Description FLUID  Final  ? Special Requests NONE  Final  ? Gram Stain   Final  ?  ABUNDANT WBC PRESENT, PREDOMINANTLY PMN ?NO ORGANISMS SEEN ?  ? Culture   Final  ?  NO GROWTH 3 DAYS ?Performed at [facility], [address] [place], [place] [address] ?  ? Report Status 10/21/2021 FINAL  Final  ?MRSA Next Gen by PCR, Nasal     Status: None  ? Collection Time: 10/22/21 11:27 AM  ? Specimen: Nasal Mucosa; Nasal Swab  ?Result Value Ref Range Status  ? MRSA by PCR Next Gen NOT DETECTED NOT DETECTED Final  ?  Comment: (NOTE) ?The GeneXpert MRSA Assay (FDA approved for NASAL specimens only), ?is one component of a comprehensive MRSA colonization surveillance ?program. It is not intended to diagnose MRSA infection nor to guide ?or monitor treatment for MRSA infections. ?Test performance is not FDA approved in patients less than  2 years ?old. ?Performed at [facility], [address] [place], [place] [address] ?  ?Culture, Respiratory w Gram Stain     Status: None  ? Collection Time: 10/22/21  2:20 PM  ? Specimen: Tracheal Aspirate; Respi

## 2021-10-29 ENCOUNTER — Inpatient Hospital Stay (HOSPITAL_COMMUNITY): Payer: Medicare Other

## 2021-10-29 LAB — CBC WITH DIFFERENTIAL/PLATELET
Abs Immature Granulocytes: 0.11 10*3/uL — ABNORMAL HIGH (ref 0.00–0.07)
Basophils Absolute: 0 10*3/uL (ref 0.0–0.1)
Basophils Relative: 0 %
Eosinophils Absolute: 0.2 10*3/uL (ref 0.0–0.5)
Eosinophils Relative: 1 %
HCT: 26.7 % — ABNORMAL LOW (ref 39.0–52.0)
Hemoglobin: 8.3 g/dL — ABNORMAL LOW (ref 13.0–17.0)
Immature Granulocytes: 1 %
Lymphocytes Relative: 4 %
Lymphs Abs: 0.8 10*3/uL (ref 0.7–4.0)
MCH: 27.6 pg (ref 26.0–34.0)
MCHC: 31.1 g/dL (ref 30.0–36.0)
MCV: 88.7 fL (ref 80.0–100.0)
Monocytes Absolute: 1.5 10*3/uL — ABNORMAL HIGH (ref 0.1–1.0)
Monocytes Relative: 7 %
Neutro Abs: 18 10*3/uL — ABNORMAL HIGH (ref 1.7–7.7)
Neutrophils Relative %: 87 %
Platelets: 288 10*3/uL (ref 150–400)
RBC: 3.01 MIL/uL — ABNORMAL LOW (ref 4.22–5.81)
RDW: 20.2 % — ABNORMAL HIGH (ref 11.5–15.5)
WBC: 20.5 10*3/uL — ABNORMAL HIGH (ref 4.0–10.5)
nRBC: 0 % (ref 0.0–0.2)

## 2021-10-29 LAB — BASIC METABOLIC PANEL
Anion gap: 5 (ref 5–15)
BUN: 32 mg/dL — ABNORMAL HIGH (ref 8–23)
CO2: 31 mmol/L (ref 22–32)
Calcium: 8.6 mg/dL — ABNORMAL LOW (ref 8.9–10.3)
Chloride: 97 mmol/L — ABNORMAL LOW (ref 98–111)
Creatinine, Ser: <0.3 mg/dL — ABNORMAL LOW (ref 0.61–1.24)
Glucose, Bld: 140 mg/dL — ABNORMAL HIGH (ref 70–99)
Potassium: 4.2 mmol/L (ref 3.5–5.1)
Sodium: 133 mmol/L — ABNORMAL LOW (ref 135–145)

## 2021-10-29 LAB — GLUCOSE, CAPILLARY
Glucose-Capillary: 118 mg/dL — ABNORMAL HIGH (ref 70–99)
Glucose-Capillary: 134 mg/dL — ABNORMAL HIGH (ref 70–99)
Glucose-Capillary: 136 mg/dL — ABNORMAL HIGH (ref 70–99)
Glucose-Capillary: 101 mg/dL — ABNORMAL HIGH (ref 70–99)
Glucose-Capillary: 114 mg/dL — ABNORMAL HIGH (ref 70–99)
Glucose-Capillary: 129 mg/dL — ABNORMAL HIGH (ref 70–99)

## 2021-10-29 MED ORDER — FOLIC ACID 1 MG PO TABS: 1.0000 mg | ORAL_TABLET | Freq: Every day | ORAL | Status: AC

## 2021-10-29 MED ORDER — THIAMINE HCL 100 MG PO TABS: 100.0000 mg | ORAL_TABLET | Freq: Every day | ORAL | Status: AC

## 2021-10-29 MED ORDER — ALBUTEROL SULFATE (2.5 MG/3ML) 0.083% IN NEBU
2.5000 mg | INHALATION_SOLUTION | RESPIRATORY_TRACT | 12 refills | Status: AC | PRN
Start: 2021-10-29 — End: ?

## 2021-10-29 MED ORDER — GUAIFENESIN 100 MG/5ML PO LIQD
10.0000 mL | Freq: Four times a day (QID) | ORAL | 0 refills | Status: AC | PRN
Start: 2021-10-29 — End: ?

## 2021-10-29 MED ORDER — ARFORMOTEROL TARTRATE 15 MCG/2ML IN NEBU: 15.0000 ug | INHALATION_SOLUTION | Freq: Two times a day (BID) | RESPIRATORY_TRACT | 0 refills | Status: AC

## 2021-10-29 MED ORDER — CHLORHEXIDINE GLUCONATE 0.12 % MT SOLN: 15.0000 mL | Freq: Two times a day (BID) | OROMUCOSAL | 0 refills | Status: AC

## 2021-10-29 MED ORDER — PROSOURCE TF PO LIQD: 45.0000 mL | Freq: Two times a day (BID) | ORAL | Status: AC

## 2021-10-29 MED ORDER — OXYCODONE HCL 5 MG PO TABS
10.0000 mg | ORAL_TABLET | ORAL | Status: DC | PRN
  Administered 2021-10-29 – 2021-10-30 (×3): 10 mg
  Filled 2021-10-29 (×5): qty 2

## 2021-10-29 MED ORDER — OXYCODONE HCL 10 MG PO TABS
10.0000 mg | ORAL_TABLET | ORAL | 0 refills | Status: AC | PRN
Start: 2021-10-29 — End: ?

## 2021-10-29 MED ORDER — ENOXAPARIN SODIUM 30 MG/0.3ML IJ SOSY: 30.0000 mg | PREFILLED_SYRINGE | INTRAMUSCULAR | Status: AC

## 2021-10-29 MED ORDER — VITAL 1.5 CAL PO LIQD: 1000.0000 mL | ORAL | Status: AC

## 2021-10-29 MED ORDER — INSULIN ASPART 100 UNIT/ML IJ SOLN: 0.0000 [IU] | INTRAMUSCULAR | 11 refills | Status: AC

## 2021-10-29 MED ORDER — ACETAMINOPHEN 500 MG PO TABS: 1000.0000 mg | ORAL_TABLET | Freq: Four times a day (QID) | ORAL | 0 refills | Status: AC | PRN

## 2021-10-29 MED ORDER — ATORVASTATIN CALCIUM 40 MG PO TABS: 40.0000 mg | ORAL_TABLET | Freq: Every day | ORAL | 0 refills | Status: AC

## 2021-10-29 MED ORDER — HYDROCHLOROTHIAZIDE 12.5 MG PO TABS: 6.2500 mg | ORAL_TABLET | Freq: Every day | ORAL | Status: AC

## 2021-10-29 MED ORDER — AMLODIPINE BESYLATE 10 MG PO TABS: 10.0000 mg | ORAL_TABLET | Freq: Every day | ORAL | 0 refills | Status: AC

## 2021-10-29 MED ORDER — NUTRISOURCE FIBER PO PACK: 1.0000 | PACK | Freq: Two times a day (BID) | ORAL | Status: AC

## 2021-10-29 MED ORDER — FAMOTIDINE 20 MG PO TABS: 20.0000 mg | ORAL_TABLET | Freq: Every day | ORAL | Status: AC

## 2021-10-29 MED ORDER — BUDESONIDE 0.25 MG/2ML IN SUSP: 0.2500 mg | Freq: Two times a day (BID) | RESPIRATORY_TRACT | 12 refills | Status: AC

## 2021-10-29 MED ORDER — ADULT MULTIVITAMIN W/MINERALS CH: 1.0000 | ORAL_TABLET | Freq: Every day | ORAL | Status: AC

## 2021-10-29 MED ORDER — PANTOPRAZOLE SODIUM 40 MG PO PACK: 40.0000 mg | PACK | Freq: Two times a day (BID) | ORAL | Status: AC

## 2021-10-29 MED ORDER — ORAL CARE MOUTH RINSE: 15.0000 mL | Freq: Two times a day (BID) | OROMUCOSAL | 0 refills | Status: AC

## 2021-10-29 NOTE — TOC Progression Note (Addendum)
Transition of Care (TOC) - Progression Note  ? ? ?Patient Details  ?Name: Troy Sampson ?MRN: [id number] ?Date of Birth: 05/03/1956 ? ?Transition of Care (TOC) CM/SW Contact  ? P , LCSW ?Phone Number: ?10/29/2021, 10:12 AM ? ?Clinical Narrative:    ? ?CSW called pt daughter. She provided update that she was able to submit the correct paper work to [employer] for medicare part B and that patient is still in his open enrollment period though she is unsure what the turn around time is for part B to be secured. CSW reiterated that [facility] could accept pt with part A and that pt would receive bills for physician services until part B is active. CSW explained that the hospital wouldn't be able to wait for part B if a safe d/c plan is identified and pt is medically stable to be d/c'd. She expressed understanding and is agreeable for pt to go to [facility] with just part A for now. She explains CSW would need to discuss with pt as well.  ? ?CSW met with pt and explained situation. He expresses preference for [facility] versus a different [facility]. He expressed understanding of the insurance/billing situation. He states he will need to talk with his daughter first before making any decision.  ? ?1100: CSW spoke with Jen with [facility]; she will review pt further and discuss with [facility] admissions for possible DC this afternoon or this weekend pending bed availability.  ? ?1430: Jen with [facility] spoke with pt's daughter who provided verbal consent to admit to [facility]. She will still be coming to discuss further with pt though will go ahead and move forward with this plan as pt did state he prefers [facility] and daughter has a clearer understanding of insurance situation(medicare part B in process). CSW will continue to assist with DC coordination.  ? ? ? ?Expected Discharge Plan: Long Term Acute Care (LTAC) ?  ? ?Expected Discharge Plan and Services ?Expected Discharge Plan: Long Term Acute Care (LTAC) ?  ?  ?  ?  ?                ?  ?   ?  ?  ?  ?  ?  ?  ?  ?  ? ? ?Social Determinants of Health (SDOH) Interventions ?Alcohol Brief Interventions/Follow-up: Patient Refused ? ?Readmission Risk Interventions ?   ? View : No data to display.  ?  ?  ?  ? ? ?

## 2021-10-29 NOTE — Progress Notes (Addendum)
Speech Language Pathology Treatment: [name] Speaking valve  ?Patient Details ?Name: Troy Sampson ?MRN: [id number] ?DOB: Sep 09, 1955 ?Today's Date: 10/29/2021 ?Time: 1638-4536 ?SLP Time Calculation (min) (ACUTE ONLY): 33 min ? ?Assessment / Plan / Recommendation ?Clinical Impression ? Pt today seen for skilled SLP for pt's verbal communication to allow improved participation in pt's care plan.  Pt willing to attempt valve - All vitals remained stable during approximately 30 minute trial.  Phonation is weak/dysphonic but with total assist/cues to make max effort he can obtain phonation with vowel sounds - less so with bilabial plosives.  Pt denies any changes with respiratory status during session, states breathing is just as easy with valve in place as without.  Pt admits voicing is stronger with use in the morning - thus he may benefit from primary interactions re: his care in the am to allow him maximal ease in communication.   ? ?Using max verbal cues, pt able to make max effort with phonation and spelled words that SLP was unable to understand.  Deconditioning/laryngeal adductors/diaphragmatic weakness appears to be source of compromised phonation ability but pt was motivated to answer questions with short phrase or single word answers during session. Encouraged pt to continue to strength voice and cough using teach back.  Using visual feed back (photo) SLP showed pt his trach with and without valve in place.   ? ?Recommend PMSV use throughout the day as tolerated given excellent tolerance. Posted precaution signs in pt's room and removed valve before leaving pt as he advised he wanted to take a nap.  Please assure pt's call bell within reach at ALL times.  ?    ?HPI HPI: 66 y/o male who presented from [facility] with c/o SOB x 1 week. Pt intubated for acute hypoxic respiratory failure and increased work of breathing. Noted to have a fever of 101.8. CXR show a LLL pneumonia. Intubated from 2/22 to 3/4  when trach placed. 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 Palliative care involved as prognosis for meaningful recovery is poor.  Pt is on trach collar at this time and PMSV trials have commenced. ?  ?   ?SLP Plan ? Continue with current plan of care ? ?  ?  ?Recommendations for follow up therapy are one component of a multi-disciplinary discharge planning process, led by the attending physician.  Recommendations may be updated based on patient status, additional functional criteria and insurance authorization. ?  ? ?Recommendations  ?   ?   ? Patient may use Passy-Muir Speech Valve: During all therapies with supervision;During all waking hours (remove during sleep) ?PMSV Supervision: Intermittent  ?   ? ? ? ? Follow Up Recommendations: SLP at Long-term acute care hospital ?Assistance recommended at discharge: Frequent or constant Supervision/Assistance ?SLP Visit Diagnosis: Aphonia (R49.1) ?Plan: Continue with current plan of care ? ? ? ? ?  ?  ?Rolena Infante, MS CCC SLP ?Acute Rehab Services ?Office [phone] ?Pager [phone] ? ? ?Chales Abrahams ? ?10/29/2021, 10:16 AM ?

## 2021-10-29 NOTE — TOC Transition Note (Signed)
Transition of Care (TOC) - CM/SW Discharge Note ? ? ?Patient Details  ?Name: Troy Sampson ?MRN: [id number] ?Date of Birth: 06-Aug-1955 ? ?Transition of Care (TOC) CM/SW Contact:  ?Nhyira Leano Aris Lot, LCSW ?Phone Number: ?10/29/2021, 2:45 PM ? ? ?Clinical Narrative:    ? ?Patient will DC to: [facility] ?Anticipated DC date: 10/29/21 ?Family notified: Ara Kussmaul (Daughter)  ?[phone] (Home Phone) ? ? ? ?Per MD patient ready for DC to [facility]. RN, patient, patient's family, and facility notified of DC. Discharge Summary sent to facility. Report will need to be called AFTER RN shift change to give [facility] time to have room ready for pt. [facility] is waiting on their own patient to discharge and is scheduled with [facility] at 6pm; then [facility] will have to clean and arrange room before this pt can admit. 2nd shift RN to call report to [facility] at [phone]; Receiving doctor is Luna Kitchens. If there are any issues at DC, please call Candise Bowens with [facility] at [phone].   ? ?CSW will sign off for now as social work intervention is no longer needed. Please consult [place] again if new needs arise.  ? ?Final next level of care: Long Term Acute Care (LTAC) ?Barriers to Discharge: No Barriers Identified ? ? ?Patient Goals and CMS Choice ?  ?  ?  ? ?Discharge Placement ?  ?           ?Patient chooses bed at:  [facility]) ?  ?Name of family member notified: Ara Kussmaul (Daughter)   [phone] (Home Phone) ?Patient and family notified of of transfer: 10/29/21 ? ?Discharge Plan and Services ?  ?  ?           ?  ?  ?  ?  ?  ?  ?  ?  ?  ?  ? ?Social Determinants of Health (SDOH) Interventions ?Alcohol Brief Interventions/Follow-up: Patient Refused ? ? ?Readmission Risk Interventions ?   ? View : No data to display.  ?  ?  ?  ? ? ? ? ? ?

## 2021-10-29 NOTE — Discharge Summary (Signed)
Physician Discharge Summary  ?Troy Sampson LFY:[id number] DOB: August 04, 1955 DOA: 09/21/2021 ? ?PCP: Pcp, No ? ?Admit date: 09/21/2021 ?Discharge date: 10/29/2021 ? ?Admitted From: home  ?Disposition:  [facility] hospital. ? ?Recommendations for Outpatient Follow-up:  ?To be determined. ?Schedule gastroenterology referral on discharge. ? ?Home Health:NA  ?Equipment/Devices:NA  ? ?Discharge Condition:fair   ?CODE STATUS: full code for transportation purposes. ?Diet recommendation: NPO. Tube feeding  ? ?Discharge Summary: ?66 year old gentleman with history of duodenal ulcer, hepatitis C, iron deficiency anemia, smoker who presented to [facility] with 1 week of shortness of breath on 09/21/2021 where he was found to be hypoxic with oxygen 80, tachypneic with respiratory 40s, tachycardia.  He was intubated in the emergency room and sent to ICU.  On admission, noted temperature 101.8, left lower pneumonia on chest x-ray. ?  ?Significant events: ?2/22, admitted overnight to ICU.  Blood cultures 3/4 positive for Streptococcus pneumonia.  Streptococcus pneumonia antigen positive in urine.  TEE with ejection fraction 50 to 55%, ultimately developed ongoing fever, antibiotics broadened to cefepime and vancomycin and then penicillin G. ?3/1, vasoactive drip requirements improving, 1 unit of PRBC for hemoglobin 6.1.  Appropriately responded to 6.8.  Received 2 more units of PRBC and 3 units of platelets since admission. ?3/3, EGD showed grade D esophagitis with bleeding. ?3/4, tracheostomy, now on trach collar ?3/18, another 1 unit of PRBC ?3/27, PEG tube placement. ?3/29, downsized to cuff less #6 trach ?  ?  ?Assessment & Plan of care: ?  ?Acute hypoxemic and hypercapnic respiratory failure secondary to left lower lobe pneumonia, streptococcal bacteremia, septic shock: ?-Prolonged intubation and currently with tracheostomy, currently on trach collar.  ?Cuffless trach 3/29. Continue aggressive pulmonary hygiene,  respiratory therapy. ?-Dysphagia secondary to above, PEG tube placement on 3/27 and currently tolerating PEG tube feeding. ?-Will need long-term acute care hospital for rehab and weaning. ?  ?Tracheostomy care, oxygen goal more than 90%, continue Pulmicort, Brovana and as needed bronchodilators.  Speech therapy following for Passy-Muir valve placement with supervision.  Clinically improving. ?  ?Acute blood loss anemia from upper GI bleeding with esophagitis, anemia of critical illness, anemia of chronic disease: ?Patient received total 4 units of PRBC, hemoglobin is stable since then.  On Protonix. ? ?Essential hypertension: Blood pressure on goal.  Currently on amlodipine and hydrochlorothiazide. ?  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  No other evidence of metastatic disease.  Will need outpatient follow-up.  please refer to GI after clinical improvement. ?  ?Leukocytosis: Persistent leukocytosis.  Cause unknown.  Without fever.   ?C diff was negative.  Diarrhea improved.   ?We will repeat CT scan of the chest if worsening leukocytosis, may have chronic organized empyema. ?  ?Hyperkalemia: Improved with Lokelma.  Will need ongoing monitoring. ?  ?Hypomagnesemia: Replaced.  Adequate. ? ?Patient with multiple acute medical issues.  Patient may need for ongoing physical therapy rehab, weaning off from tracheostomy, PEG tube feeding and acute critical illness management.  Stable to transfer to long-term acute care hospital. ? ? ? ?Discharge Diagnoses:  ?Principal Problem: ?  Pneumonia, pneumococcal (HCC) ?Active Problems: ?  Pneumococcal bacteremia ?  Acute respiratory failure with hypoxia (HCC) ?  Sepsis (HCC) ?  Pressure injury of skin ?  Acute metabolic encephalopathy ?  Alteration in electrolyte and fluid balance ?  Hepatitis C ?  Liver mass ?  Anemia ?  NSTEMI (non-ST elevated myocardial infarction) (HCC) ?  Thrombocytopenia (HCC) ?   Protein-calorie malnutrition, severe ?  Status post tracheostomy (HCC) ?  Upper GI bleed ? ? ? ?Discharge Instructions ? ?Discharge Instructions   ? ? Discharge wound care:   Complete by: As directed ?  ? Barrier dressing , protection padding  ? Increase activity slowly   Complete by: As directed ?  ? ?  ? ?Allergies as of 10/29/2021   ?No Known Allergies ?  ? ?  ?Medication List  ?  ? ?STOP taking these medications   ? ?IRON PO ?  ? ?  ? ?TAKE these medications   ? ?acetaminophen 500 MG tablet ?Commonly known as: TYLENOL ?Take 2 tablets (1,000 mg total) by mouth every 6 (six) hours as needed for moderate pain. ?  ?albuterol (2.5 MG/3ML) 0.083% nebulizer solution ?Commonly known as: PROVENTIL ?Take 3 mLs (2.5 mg total) by nebulization every 4 (four) hours as needed for wheezing or shortness of breath. ?  ?amLODipine 10 MG tablet ?Commonly known as: NORVASC ?Place 1 tablet (10 mg total) into feeding tube daily. ?Start taking on: October 30, 2021 ?  ?arformoterol 15 MCG/2ML Nebu ?Commonly known as: BROVANA ?Take 2 mLs (15 mcg total) by nebulization 2 (two) times daily. ?  ?atorvastatin 40 MG tablet ?Commonly known as: LIPITOR ?Place 1 tablet (40 mg total) into feeding tube daily. ?Start taking on: October 30, 2021 ?  ?budesonide 0.25 MG/2ML nebulizer solution ?Commonly known as: PULMICORT ?Take 2 mLs (0.25 mg total) by nebulization 2 (two) times daily. ?  ?chlorhexidine 0.12 % solution ?Commonly known as: PERIDEX ?15 mLs by Mouth Rinse route 2 (two) times daily. ?  ?enoxaparin 30 MG/0.3ML injection ?Commonly known as: LOVENOX ?Inject 0.3 mLs (30 mg total) into the skin daily. ?Start taking on: October 30, 2021 ?  ?famotidine 20 MG tablet ?Commonly known as: PEPCID ?Place 1 tablet (20 mg total) into feeding tube daily. ?Start taking on: October 30, 2021 ?  ?feeding supplement (PROSource TF) liquid ?Place 45 mLs into feeding tube 2 (two) times daily. ?  ?feeding supplement (VITAL 1.5 CAL) Liqd ?Place 1,000 mLs into feeding tube  continuous. ?  ?fiber Pack packet ?Place 1 packet into feeding tube 2 (two) times daily. ?  ?folic acid 1 MG tablet ?Commonly known as: FOLVITE ?Place 1 tablet (1 mg total) into feeding tube daily. ?Start taking on: October 30, 2021 ?  ?guaiFENesin 100 MG/5ML liquid ?Commonly known as: ROBITUSSIN ?Place 10 mLs into feeding tube every 6 (six) hours as needed for cough or to loosen phlegm. ?  ?hydrochlorothiazide 12.5 MG tablet ?Commonly known as: HYDRODIURIL ?Place 0.5 tablets (6.25 mg total) into feeding tube daily. ?Start taking on: October 30, 2021 ?  ?insulin aspart 100 UNIT/ML injection ?Commonly known as: novoLOG ?Inject 0-9 Units into the skin every 4 (four) hours. ?  ?mouth rinse Liqd solution ?15 mLs by Mouth Rinse route 2 times daily at 12 noon and 4 pm. ?  ?multivitamin with minerals Tabs tablet ?Place 1 tablet into feeding tube daily. ?Start taking on: October 30, 2021 ?  ?Oxycodone HCl 10 MG Tabs ?Place 1 tablet (10 mg total) into feeding tube every 4 (four) hours as needed for severe pain. ?  ?pantoprazole sodium 40 mg ?Commonly known as: PROTONIX ?Place 40 mg into feeding tube 2 (two) times daily. ?  ?sodium chloride 0.65 % Soln nasal spray ?Commonly known as: OCEAN ?Place 1 spray into both nostrils as needed for congestion. ?  ?thiamine 100 MG tablet ?Place 1 tablet (  100 mg total) into feeding tube daily. ?Start taking on: October 30, 2021 ?  ? ?  ? ?  ?  ? ? ?  ?Discharge Care Instructions  ?(From admission, onward)  ?  ? ? ?  ? ?  Start     Ordered  ? 10/29/21 0000  Discharge wound care:       ?Comments: Barrier dressing , protection padding  ? 10/29/21 1439  ? ?  ?  ? ?  ? ? Follow-up Information   ? ? Roanna Banning, MD Follow up.   ?Specialties: Interventional Radiology, Diagnostic Radiology, Radiology ?Why: Schedulers will call to arrange routine G-tube exchange in Interventional Radiology. ?Contact information: ?[address] ?[address] ?[place] [address] ?[phone] ? ? ?  ?  ? ?  ?  ? ?  ? ?No  Known Allergies ? ?Consultations: ?Critical care ?Interventional radiology ? ? ? ?Procedures/Studies: ?CT ABDOMEN WO CONTRAST ? ?Result Date: 10/21/2021 ?CLINICAL DATA:  Respiratory failure and assessment abdominal a

## 2021-10-29 NOTE — Progress Notes (Signed)
?PROGRESS NOTE ? ? ? ?Marsalis Giuliani  [id number] DOB: 1956-01-08 DOA: 09/21/2021 ?PCP: Pcp, No  ? ? ?Brief Narrative:  ?66 year old gentleman with history of duodenal ulcer, hepatitis C, iron deficiency anemia, smoker who presented to [place] with 1 week of shortness of breath on 09/21/2021 where he was found to be hypoxic with oxygen 80, tachypneic with respiratory 40s, tachycardia.  He was intubated in the emergency room and sent to ICU.  On admission, noted temperature 101.8, left lower pneumonia on chest x-ray. ? ?Significant events: ?2/22, admitted overnight to ICU.  Blood cultures 3/4 positive for Streptococcus pneumonia.  Streptococcus pneumonia antigen positive in urine.  TEE with ejection fraction 50 to 55%, ultimately developed ongoing fever, antibiotics broadened to cefepime and vancomycin and then penicillin G. ?3/1, vasoactive drip requirements improving, 1 unit of PRBC for hemoglobin 6.1.  Appropriately responded to 6.8.  Received 2 more units of PRBC and 3 units of platelets since admission. ?3/3, EGD showed grade D esophagitis with bleeding. ?3/4, tracheostomy, now on trach collar ?3/18, another 1 unit of PRBC ?3/27, PEG tube placement. ? ? ?Assessment & Plan: ?  ?Acute hypoxemic and hypercapnic respiratory failure secondary to left lower lobe pneumonia, streptococcal bacteremia, septic shock: ?-Prolonged intubation and currently with tracheostomy, currently on trach collar.  ?Cuffless trach 3/29. Continue aggressive pulmonary hygiene, respiratory therapy. ?-Dysphagia secondary to above, PEG tube placement on 3/27 and currently tolerating PEG tube feeding. ?-Will need long-term acute care hospital for rehab and weaning. ? ?Tracheostomy care, oxygen goal more than 90%, continue Pulmicort, Brovana and Yupelri, as needed bronchodilators.  Speech therapy following for Passy-Muir valve placement with supervision.  Clinically improving. ? ?Acute blood loss anemia from upper GI bleeding with  esophagitis, anemia of critical illness, anemia of chronic disease: ?Patient received total 4 units of PRBC, hemoglobin is stable since then.  On Protonix. ? ?Essential hypertension: Blood pressure on goal.  Currently on amlodipine and hydrochlorothiazide. ? 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  No other evidence of metastatic disease.  Will need outpatient follow-up.  Will refer to GI after clinical improvement. ? ?Leukocytosis: Persistent leukocytosis.  Cause unknown.  Without fever.   ?C diff was negative.  Diarrhea improved.   ?We will repeat CT scan of the chest if worsening leukocytosis, may have chronic organized empyema. ? ?Hyperkalemia: Improved with Lokelma.  We will continue to monitor. ? ?Hypomagnesemia: Replaced.  Adequate. ? ? ?DVT prophylaxis: enoxaparin (LOVENOX) injection 30 mg Start: 10/25/21 1245 ?SCDs Start: 09/22/21 0044 ? ? ?Code Status: Partial, okay for intubation and mechanical ventilation but no chest compression. ?Family Communication: None at the bedside. ?Disposition Plan: Status is: Inpatient ?Remains inpatient appropriate because: Tracheostomy and PEG tube care.  Will need long-term placement. ?  ? ? ?Consultants:  ?PCCM ? ?Procedures:  ?Multiple procedures as above. ? ?Antimicrobials:  ?Completed antibiotic therapy. ? ? ?Subjective: ? ?Patient seen and examined.  Overnight pain remains in the abdominal area especially at the PEG tube insertion site.  Using a small dose of IV Dilaudid.  We discussed about going up on oral oxycodone to discourage use of injectables.  He is on trach collar.  Denies any shortness of breath.  Able to voice his concerns.  He tells me that he was so weak he could not hold up his trunk to sit up. ? ?Objective: ?Vitals:  ? 10/29/21 0103 10/29/21 0345 10/29/21 0400 10/29/21 0800  ?BP:  Marland Kitchen)  142/72 (!) 148/74 (!) 147/73  ?Pulse: 91 93 94 92  ?Resp: 19 19 19 13   ?Temp:  98.7 ?F (37.1 ?C)   98.4 ?F (36.9 ?C)  ?TempSrc:  Oral  Oral  ?SpO2: 95% 95% 95% 97%  ?Weight:      ?Height:      ? ? ?Intake/Output Summary (Last 24 hours) at 10/29/2021 1056 ?Last data filed at 10/29/2021 0700 ?Gross per 24 hour  ?Intake 720 ml  ?Output 1750 ml  ?Net -1030 ml  ? ?Filed Weights  ? 10/26/21 0500 10/27/21 0335 10/28/21 0408  ?Weight: 43.5 kg 48.1 kg 47.2 kg  ? ? ?Examination: ? ?General exam: Appears calm and comfortable.  Thin and frail.  Debilitated.  Lurline Idol site is clean.  On minimal oxygen through trach collar. ?Respiratory system: Conducted upper airway sounds.  Poor air entry at bases. ?Cardiovascular system: S1 & S2 heard, RRR.  No edema. ?Gastrointestinal system: Soft.  Mild tenderness along the PEG tube insertion site.  No residual guarding.  Bowel sound present. ?Central nervous system: Alert and oriented. No focal neurological deficits.  Can move all extremities. ? ? ? ?Data Reviewed: I have personally reviewed following labs and imaging studies ? ?CBC: ?Recent Labs  ?Lab 10/24/21 ?0312 10/26/21 ?0606 10/27/21 ?0227 10/28/21 ?[id number] 10/29/21 ?0335  ?WBC 22.4* 23.1* 25.9* 22.6* 20.5*  ?NEUTROABS  --   --   --  20.3* 18.0*  ?HGB 8.6* 9.9* 9.5* 8.9* 8.3*  ?HCT 27.4* 31.5* 29.4* 27.4* 26.7*  ?MCV 86.4 85.4 85.7 87.0 88.7  ?PLT 280 334 239 305 288  ? ?Basic Metabolic Panel: ?Recent Labs  ?Lab 10/24/21 ?0312 10/26/21 ?0606 10/27/21 ?0227 10/28/21 ?[id number] 10/29/21 ?0335  ?NA 133* 130* 130* 131* 133*  ?K 4.7 4.4 5.4* 4.3 4.2  ?CL 100 99 99 97* 97*  ?CO2 29 25 23 28 31   ?GLUCOSE 92 99 137* 123* 140*  ?BUN 23 22 32* 33* 32*  ?CREATININE 0.32* 0.31* 0.39* <0.30* <0.30*  ?CALCIUM 8.6* 8.8* 8.6* 8.7* 8.6*  ?MG 1.6*  --   --  1.6*  --   ?PHOS  --   --   --  2.9  --   ? ?GFR: ?CrCl cannot be calculated (This lab value cannot be used to calculate CrCl because it is not a number: <0.30). ?Liver Function Tests: ?No results for input(s): AST, ALT, ALKPHOS, BILITOT, PROT, ALBUMIN in the last 168 hours. ? ?No results for input(s):  LIPASE, AMYLASE in the last 168 hours. ?No results for input(s): AMMONIA in the last 168 hours. ?Coagulation Profile: ?No results for input(s): INR, PROTIME in the last 168 hours. ?Cardiac Enzymes: ?No results for input(s): CKTOTAL, CKMB, CKMBINDEX, TROPONINI in the last 168 hours. ?BNP (last 3 results) ?No results for input(s): PROBNP in the last 8760 hours. ?HbA1C: ?No results for input(s): HGBA1C in the last 72 hours. ?CBG: ?Recent Labs  ?Lab 10/28/21 ?1637 10/28/21 ?2012 10/28/21 ?2344 10/29/21 ?0355 10/29/21 ?0801  ?GLUCAP 129* 132* 118* 134* 136*  ? ?Lipid Profile: ?No results for input(s): CHOL, HDL, LDLCALC, TRIG, CHOLHDL, LDLDIRECT in the last 72 hours. ?Thyroid Function Tests: ?No results for input(s): TSH, T4TOTAL, FREET4, T3FREE, THYROIDAB in the last 72 hours. ?Anemia Panel: ?No results for input(s): VITAMINB12, FOLATE, FERRITIN, TIBC, IRON, RETICCTPCT in the last 72 hours. ?Sepsis Labs: ?No results for input(s): PROCALCITON, LATICACIDVEN in the last 168 hours. ? ?Recent Results (from the past 240 hour(s))  ?MRSA Next Gen by PCR, Nasal     Status: None  ?  Collection Time: 10/22/21 11:27 AM  ? Specimen: Nasal Mucosa; Nasal Swab  ?Result Value Ref Range Status  ? MRSA by PCR Next Gen NOT DETECTED NOT DETECTED Final  ?  Comment: (NOTE) ?The GeneXpert MRSA Assay (FDA approved for NASAL specimens only), ?is one component of a comprehensive MRSA colonization surveillance ?program. It is not intended to diagnose MRSA infection nor to guide ?or monitor treatment for MRSA infections. ?Test performance is not FDA approved in patients less than 2 years ?old. ?Performed at [facility], [place] [address], [place], [place] ?[address] ?  ?Culture, Respiratory w Gram Stain     Status: None  ? Collection Time: 10/22/21  2:20 PM  ? Specimen: Tracheal Aspirate; Respiratory  ?Result Value Ref Range Status  ? Specimen Description TRACHEAL ASPIRATE  Final  ? Special Requests NONE  Final  ? Gram Stain   Final  ?  FEW  SQUAMOUS EPITHELIAL CELLS PRESENT ?MODERATE WBC PRESENT, PREDOMINANTLY PMN ?FEW GRAM POSITIVE COCCI ?MODERATE GRAM VARIABLE ROD ?RARE BUDDING YEAST SEEN ?  ? Culture   Final  ?  FEW Normal respiratory flora-no S

## 2021-10-30 ENCOUNTER — Inpatient Hospital Stay
Admit: 2021-10-30 | Discharge: 2021-12-03 | Disposition: A | Discharge status: Skilled Nursing Facility | Payer: Medicare Other | Attending: Internal Medicine | Admitting: Internal Medicine

## 2021-10-30 ENCOUNTER — Other Ambulatory Visit (HOSPITAL_COMMUNITY): Payer: Self-pay

## 2021-10-30 DIAGNOSIS — J9601 Acute respiratory failure with hypoxia: Secondary | ICD-10-CM | POA: Diagnosis present

## 2021-10-30 DIAGNOSIS — G9341 Metabolic encephalopathy: Secondary | ICD-10-CM | POA: Diagnosis present

## 2021-10-30 DIAGNOSIS — I214 Non-ST elevation (NSTEMI) myocardial infarction: Secondary | ICD-10-CM | POA: Diagnosis present

## 2021-10-30 DIAGNOSIS — Z930 Tracheostomy status: Secondary | ICD-10-CM

## 2021-10-30 DIAGNOSIS — J189 Pneumonia, unspecified organism: Secondary | ICD-10-CM | POA: Diagnosis present

## 2021-10-30 LAB — CBC WITH DIFFERENTIAL/PLATELET
Abs Immature Granulocytes: 0.11 10*3/uL — ABNORMAL HIGH (ref 0.00–0.07)
Basophils Absolute: 0 10*3/uL (ref 0.0–0.1)
Basophils Relative: 0 %
Eosinophils Absolute: 0.2 10*3/uL (ref 0.0–0.5)
Eosinophils Relative: 1 %
HCT: 26.9 % — ABNORMAL LOW (ref 39.0–52.0)
Hemoglobin: 8.5 g/dL — ABNORMAL LOW (ref 13.0–17.0)
Immature Granulocytes: 1 %
Lymphocytes Relative: 4 %
Lymphs Abs: 0.7 10*3/uL (ref 0.7–4.0)
MCH: 28 pg (ref 26.0–34.0)
MCHC: 31.6 g/dL (ref 30.0–36.0)
MCV: 88.5 fL (ref 80.0–100.0)
Monocytes Absolute: 1.5 10*3/uL — ABNORMAL HIGH (ref 0.1–1.0)
Monocytes Relative: 8 %
Neutro Abs: 16.5 10*3/uL — ABNORMAL HIGH (ref 1.7–7.7)
Neutrophils Relative %: 86 %
Platelets: 261 10*3/uL (ref 150–400)
RBC: 3.04 MIL/uL — ABNORMAL LOW (ref 4.22–5.81)
RDW: 20.1 % — ABNORMAL HIGH (ref 11.5–15.5)
WBC: 19 10*3/uL — ABNORMAL HIGH (ref 4.0–10.5)
nRBC: 0 % (ref 0.0–0.2)

## 2021-10-30 LAB — CBC
HCT: 26.3 % — ABNORMAL LOW (ref 39.0–52.0)
Hemoglobin: 8.1 g/dL — ABNORMAL LOW (ref 13.0–17.0)
MCH: 27.1 pg (ref 26.0–34.0)
MCHC: 30.8 g/dL (ref 30.0–36.0)
MCV: 88 fL (ref 80.0–100.0)
Platelets: 292 10*3/uL (ref 150–400)
RBC: 2.99 MIL/uL — ABNORMAL LOW (ref 4.22–5.81)
RDW: 20.3 % — ABNORMAL HIGH (ref 11.5–15.5)
WBC: 21.5 10*3/uL — ABNORMAL HIGH (ref 4.0–10.5)
nRBC: 0 % (ref 0.0–0.2)

## 2021-10-30 LAB — URINALYSIS, ROUTINE W REFLEX MICROSCOPIC
Bilirubin Urine: NEGATIVE
Glucose, UA: NEGATIVE mg/dL
Hgb urine dipstick: NEGATIVE
Ketones, ur: NEGATIVE mg/dL
Nitrite: POSITIVE — AB
Protein, ur: NEGATIVE mg/dL
Specific Gravity, Urine: 1.014 (ref 1.005–1.030)
pH: 7 (ref 5.0–8.0)

## 2021-10-30 LAB — COMPREHENSIVE METABOLIC PANEL
ALT: 68 U/L — ABNORMAL HIGH (ref 0–44)
AST: 78 U/L — ABNORMAL HIGH (ref 15–41)
Albumin: 1.9 g/dL — ABNORMAL LOW (ref 3.5–5.0)
Alkaline Phosphatase: 204 U/L — ABNORMAL HIGH (ref 38–126)
Anion gap: 4 — ABNORMAL LOW (ref 5–15)
BUN: 33 mg/dL — ABNORMAL HIGH (ref 8–23)
CO2: 31 mmol/L (ref 22–32)
Calcium: 8.8 mg/dL — ABNORMAL LOW (ref 8.9–10.3)
Chloride: 98 mmol/L (ref 98–111)
Creatinine, Ser: <0.3 mg/dL — ABNORMAL LOW (ref 0.61–1.24)
Glucose, Bld: 97 mg/dL (ref 70–99)
Potassium: 5 mmol/L (ref 3.5–5.1)
Sodium: 133 mmol/L — ABNORMAL LOW (ref 135–145)
Total Bilirubin: 0.4 mg/dL (ref 0.3–1.2)
Total Protein: 7.2 g/dL (ref 6.5–8.1)

## 2021-10-30 LAB — BASIC METABOLIC PANEL
Anion gap: 6 (ref 5–15)
BUN: 32 mg/dL — ABNORMAL HIGH (ref 8–23)
CO2: 30 mmol/L (ref 22–32)
Calcium: 8.8 mg/dL — ABNORMAL LOW (ref 8.9–10.3)
Chloride: 97 mmol/L — ABNORMAL LOW (ref 98–111)
Creatinine, Ser: <0.3 mg/dL — ABNORMAL LOW (ref 0.61–1.24)
Glucose, Bld: 112 mg/dL — ABNORMAL HIGH (ref 70–99)
Potassium: 4.6 mmol/L (ref 3.5–5.1)
Sodium: 133 mmol/L — ABNORMAL LOW (ref 135–145)

## 2021-10-30 LAB — PROTIME-INR
INR: 1 (ref 0.8–1.2)
Prothrombin Time: 13.2 seconds (ref 11.4–15.2)

## 2021-10-30 LAB — MAGNESIUM: Magnesium: 1.8 mg/dL (ref 1.7–2.4)

## 2021-10-30 LAB — GLUCOSE, CAPILLARY: Glucose-Capillary: 106 mg/dL — ABNORMAL HIGH (ref 70–99)

## 2021-10-30 MED ORDER — DIATRIZOATE MEGLUMINE & SODIUM 66-10 % PO SOLN
ORAL | Status: AC
  Filled 2021-10-30: qty 30

## 2021-10-30 NOTE — Consult Note (Signed)
Pulmonary Critical Care Medicine ?[facility] [place]  ?PULMONARY SERVICE ? ?Date of Service: 10/30/2021 ? ?PULMONARY CRITICAL CARE CONSULT ? ? ?Troy Sampson  ?HBZ:[id number]  ?DOB: 1956/05/18  ? ?DOA: 10/30/2021 ? ?Referring Physician: Luna Kitchens, MD ? ?HPI: Troy Sampson is a 66 y.o. male seen for follow up of Acute on Chronic Respiratory Failure.  Patient has multiple medical problems including hepatitis C duodenal ulcer anemia smoker alcohol abuse who came into the hospital with 1 week of shortness of breath on February 21.  At that time he was significantly hypoxic with saturations in the 80% range and respiratory rate of about 40 along with tachycardia.  Patient was intubated in the emergency department sent to the ICU.  He was found to be febrile septic had pneumonia on the chest film.  As far as his hospital course was concerned he was found to have Streptococcus pneumonia and was treated with antibiotics for this with eventually being on cefepime and vancomycin and then over to penicillin G.  Patient also is requiring pressors for initial blood pressure support which were eventually weaned.  He was not able to come off the ventilator and ended up having to have a tracheostomy which was done and patient subsequently was started on T collar trials.  At the time of current evaluation patient is off the ventilator on T collar has been on 28% FiO2 and using the PMV ? ?Review of Systems:  ?ROS performed and is unremarkable other than noted above. ? ?No past medical history on file. ? ?Past Surgical History:  ?Procedure Laterality Date  ? ESOPHAGOGASTRODUODENOSCOPY N/A 10/01/2021  ? Procedure: ESOPHAGOGASTRODUODENOSCOPY (EGD);  Surgeon: Jeani Hawking, MD;  Location: [facility] ENDOSCOPY;  Service: Gastroenterology;  Laterality: N/A;  Bedside  ? IR GASTROSTOMY TUBE MOD SED  10/25/2021  ? ? ?Social History:  ? ? reports that he has been smoking cigarettes. He does not have any smokeless tobacco history on file. He  reports current alcohol use. He reports that he does not use drugs. ? ?Family History: ?Non-Contributory to the present illness ? ?No Known Allergies ? ?Medications: ?Reviewed on Rounds ? ?Physical Exam: ? ?Vitals: Temperature is 99.2 pulse 89 respiratory 22 blood pressure is 126/69 saturations 97% ? ?Ventilator Settings on T collar FiO2 of 28% ? ?General: Comfortable at this time ?Eyes: Grossly normal lids, irises & conjunctiva ?ENT: grossly tongue is normal ?Neck: no obvious mass ?Cardiovascular: S1-S2 normal no gallop rub ?Respiratory: Scattered rhonchi very coarse breath sounds ?Abdomen: Soft and nontender ?Skin: no rash seen on limited exam ?Musculoskeletal: not rigid ?Psychiatric:unable to assess ?Neurologic: no seizure no involuntary movements   ?   ?   ?Labs on Admission:  ?Basic Metabolic Panel: ?Recent Labs  ?Lab 10/24/21 ?0312 10/26/21 ?0606 10/27/21 ?0227 10/28/21 ?1017 10/29/21 ?5102 10/30/21 ?0106 10/30/21 ?0422  ?NA 133*   < > 130* 131* 133* 133* 133*  ?K 4.7   < > 5.4* 4.3 4.2 4.6 5.0  ?CL 100   < > 99 97* 97* 97* 98  ?CO2 29   < > 23 28 31 30 31   ?GLUCOSE 92   < > 137* 123* 140* 112* 97  ?BUN 23   < > 32* 33* 32* 32* 33*  ?CREATININE 0.32*   < > 0.39* <0.30* <0.30* <0.30* <0.30*  ?CALCIUM 8.6*   < > 8.6* 8.7* 8.6* 8.8* 8.8*  ?MG 1.6*  --   --  1.6*  --   --  1.8  ?PHOS  --   --   --  2.9  --   --   --   ? < > = values in this interval not displayed.  ? ? ?No results for input(s): PHART, PCO2ART, PO2ART, HCO3, O2SAT in the last 168 hours. ? ?Liver Function Tests: ?Recent Labs  ?Lab 10/30/21 ?0422  ?AST 78*  ?ALT 68*  ?ALKPHOS 204*  ?BILITOT 0.4  ?PROT 7.2  ?ALBUMIN 1.9*  ? ?No results for input(s): LIPASE, AMYLASE in the last 168 hours. ?No results for input(s): AMMONIA in the last 168 hours. ? ?CBC: ?Recent Labs  ?Lab 10/27/21 ?0227 10/28/21 ?1478 10/29/21 ?2956 10/30/21 ?0106 10/30/21 ?0422  ?WBC 25.9* 22.6* 20.5* 19.0* 21.5*  ?NEUTROABS  --  20.3* 18.0* 16.5*  --   ?HGB 9.5* 8.9* 8.3* 8.5* 8.1*   ?HCT 29.4* 27.4* 26.7* 26.9* 26.3*  ?MCV 85.7 87.0 88.7 88.5 88.0  ?PLT 239 305 288 261 292  ? ? ?Cardiac Enzymes: ?No results for input(s): CKTOTAL, CKMB, CKMBINDEX, TROPONINI in the last 168 hours. ? ?BNP (last 3 results) ?Recent Labs  ?  09/22/21 ?0312  ?BNP 471.4*  ? ? ?ProBNP (last 3 results) ?No results for input(s): PROBNP in the last 8760 hours. ? ? ?Radiological Exams on Admission: ?DG ABDOMEN PEG TUBE LOCATION ? ?Result Date: 10/30/2021 ?CLINICAL DATA:  PEG tube placement. EXAM: ABDOMEN - 1 VIEW COMPARISON:  10/03/2021 FINDINGS: Percutaneous gastrostomy tube noted. X-ray obtained after a pair injection of contrast through the PEG tube. Gastric lumen is opacified as is the lumen of the duodenum consistent with intraluminal placement of the PEG tube tip. No evidence for free spill of contrast into the peritoneal cavity. IMPRESSION: PEG tube tip positioned within the stomach. No free spill of contrast into the peritoneal cavity. Electronically Signed   By: Kennith Center M.D.   On: 10/30/2021 05:16  ? ?DG CHEST PORT 1 VIEW ? ?Result Date: 10/29/2021 ?CLINICAL DATA:  Abnormal respiration EXAM: PORTABLE CHEST 1 VIEW COMPARISON:  Portable exam 0529 hours compared to 10/24/2021 FINDINGS: Tracheostomy tube projects over tracheal air column. LEFT arm PICC line tip projects over SVC. Normal heart size, mediastinal contours, and pulmonary vascularity. Patchy BILATERAL pulmonary infiltrates again identified consistent with multifocal pneumonia. Decreased RIGHT pleural effusion with persistent small LEFT pleural effusion. No pneumothorax or acute osseous findings. IMPRESSION: Persistent BILATERAL pulmonary infiltrates consistent with multifocal pneumonia. Small LEFT pleural effusion with decreased RIGHT pleural effusion since previous exam. Electronically Signed   By: Ulyses Southward M.D.   On: 10/29/2021 08:13   ? ?Assessment/Plan ?Active Problems: ?  Community acquired pneumonia of left lower lobe of lung ?  Acute  respiratory failure with hypoxia (HCC) ?  Acute metabolic encephalopathy ?  NSTEMI (non-ST elevated myocardial infarction) (HCC) ?  Status post tracheostomy (HCC) ? ? ?Acute on chronic respiratory failure with hypoxia patient is off the ventilator on T collar on 28% FiO2 using the PMV.  Plan is going to be to continue with the T collar weans and continue to titrate oxygen as tolerated. ?Metabolic encephalopathy slowly improving plan is going to be to continue to monitor closely. ?Pneumonia due to pneumococcus patient has been treated with antibiotics patient's last chest x-ray was showing persistent pulmonary infiltrates along with small left-sided effusion but improvement on the right side we will continue to follow-up on x-rays ?Non-STEMI supportive care no sign of active ischemia ?Tracheostomy status patient continues to do okay with the weaning continue with the PMV as ordered. ? ?I have personally seen and evaluated the patient, evaluated laboratory and  imaging results, formulated the assessment and plan and placed orders. ?The Patient requires high complexity decision making with multiple systems involvement.  ?Case was discussed on Rounds with the Respiratory Therapy Director and the Respiratory staff ?Time Spent 70minutes ? ?Yevonne PaxSaadat A Alwyn Cordner, MD FCCP ?Pulmonary Critical Care Medicine ?Sleep Medicine ? ?

## 2021-10-31 DIAGNOSIS — G9341 Metabolic encephalopathy: Secondary | ICD-10-CM | POA: Diagnosis not present

## 2021-10-31 DIAGNOSIS — I214 Non-ST elevation (NSTEMI) myocardial infarction: Secondary | ICD-10-CM | POA: Diagnosis not present

## 2021-10-31 DIAGNOSIS — J189 Pneumonia, unspecified organism: Secondary | ICD-10-CM | POA: Diagnosis not present

## 2021-10-31 DIAGNOSIS — J9601 Acute respiratory failure with hypoxia: Secondary | ICD-10-CM | POA: Diagnosis not present

## 2021-10-31 NOTE — Progress Notes (Signed)
Pulmonary Critical Care Medicine ?[facility] [place] ?  ?PULMONARY CRITICAL CARE SERVICE ? ?PROGRESS NOTE ? ? ? ? ?Troy Sampson  ?LFY:[id number]  ?DOB: 1955/09/12  ? ?DOA: 10/30/2021 ? ?Referring Physician: Luna Kitchens, MD ? ?HPI: Troy Sampson is a 66 y.o. male being followed for ventilator/airway/oxygen weaning Acute on Chronic Respiratory Failure.  Patient reportedly has minimal secretions has been on the T collar on 28% FiO2 ? ?Medications: ?Reviewed on Rounds ? ?Physical Exam: ? ?Vitals: Temperature is 97.1 pulse 81 respiratory rate is 23 blood pressure is 125/66 saturations 100% ? ?Ventilator Settings on T collar FiO2 28% ? ?General: Comfortable at this time ?Neck: supple ?Cardiovascular: no malignant arrhythmias ?Respiratory: Scattered rhonchi expansion is equal ?Skin: no rash seen on limited exam ?Musculoskeletal: No gross abnormality ?Psychiatric:unable to assess ?Neurologic:no involuntary movements   ?   ?   ?Lab Data:  ? ?Basic Metabolic Panel: ?Recent Labs  ?Lab 10/27/21 ?0227 10/28/21 ?8527 10/29/21 ?7824 10/30/21 ?0106 10/30/21 ?0422  ?NA 130* 131* 133* 133* 133*  ?K 5.4* 4.3 4.2 4.6 5.0  ?CL 99 97* 97* 97* 98  ?CO2 23 28 31 30 31   ?GLUCOSE 137* 123* 140* 112* 97  ?BUN 32* 33* 32* 32* 33*  ?CREATININE 0.39* <0.30* <0.30* <0.30* <0.30*  ?CALCIUM 8.6* 8.7* 8.6* 8.8* 8.8*  ?MG  --  1.6*  --   --  1.8  ?PHOS  --  2.9  --   --   --   ? ? ?ABG: ?No results for input(s): PHART, PCO2ART, PO2ART, HCO3, O2SAT in the last 168 hours. ? ?Liver Function Tests: ?Recent Labs  ?Lab 10/30/21 ?0422  ?AST 78*  ?ALT 68*  ?ALKPHOS 204*  ?BILITOT 0.4  ?PROT 7.2  ?ALBUMIN 1.9*  ? ?No results for input(s): LIPASE, AMYLASE in the last 168 hours. ?No results for input(s): AMMONIA in the last 168 hours. ? ?CBC: ?Recent Labs  ?Lab 10/27/21 ?0227 10/28/21 ?10/30/21 10/29/21 ?10/31/21 10/30/21 ?0106 10/30/21 ?0422  ?WBC 25.9* 22.6* 20.5* 19.0* 21.5*  ?NEUTROABS  --  20.3* 18.0* 16.5*  --   ?HGB 9.5* 8.9* 8.3* 8.5* 8.1*  ?HCT 29.4*  27.4* 26.7* 26.9* 26.3*  ?MCV 85.7 87.0 88.7 88.5 88.0  ?PLT 239 305 288 261 292  ? ? ?Cardiac Enzymes: ?No results for input(s): CKTOTAL, CKMB, CKMBINDEX, TROPONINI in the last 168 hours. ? ?BNP (last 3 results) ?Recent Labs  ?  09/22/21 ?0312  ?BNP 471.4*  ? ? ?ProBNP (last 3 results) ?No results for input(s): PROBNP in the last 8760 hours. ? ?Radiological Exams: ?DG ABDOMEN PEG TUBE LOCATION ? ?Result Date: 10/30/2021 ?CLINICAL DATA:  PEG tube placement. EXAM: ABDOMEN - 1 VIEW COMPARISON:  10/03/2021 FINDINGS: Percutaneous gastrostomy tube noted. X-ray obtained after a pair injection of contrast through the PEG tube. Gastric lumen is opacified as is the lumen of the duodenum consistent with intraluminal placement of the PEG tube tip. No evidence for free spill of contrast into the peritoneal cavity. IMPRESSION: PEG tube tip positioned within the stomach. No free spill of contrast into the peritoneal cavity. Electronically Signed   By: 12/03/2021 M.D.   On: 10/30/2021 05:16   ? ?Assessment/Plan ?Active Problems: ?  Community acquired pneumonia of left lower lobe of lung ?  Acute respiratory failure with hypoxia (HCC) ?  Acute metabolic encephalopathy ?  NSTEMI (non-ST elevated myocardial infarction) (HCC) ?  Status post tracheostomy (HCC) ? ? ?Acute on chronic respiratory failure with hypoxia we will continue with the T-piece patient  is on 28% FiO2 good saturations ?Community-acquired pneumonia with pneumococcal pneumonia-been diagnosed patient has been treated we will continue to follow along follow-up x-rays as warranted ?Acute metabolic encephalopathy slow to improve ?Non-STEMI no change we will continue to monitor ?Tracheostomy remains in place ? ? ?I have personally seen and evaluated the patient, evaluated laboratory and imaging results, formulated the assessment and plan and placed orders. ?The Patient requires high complexity decision making with multiple systems involvement.  ?Rounds were done with the  Respiratory Therapy Director and Staff therapists and discussed with nursing staff also. ? ?Yevonne Pax, MD FCCP ?Pulmonary Critical Care Medicine ?Sleep Medicine ? ?

## 2021-11-01 ENCOUNTER — Other Ambulatory Visit (HOSPITAL_COMMUNITY): Payer: Self-pay

## 2021-11-01 ENCOUNTER — Encounter (HOSPITAL_BASED_OUTPATIENT_CLINIC_OR_DEPARTMENT_OTHER): Payer: Self-pay

## 2021-11-01 DIAGNOSIS — G9341 Metabolic encephalopathy: Secondary | ICD-10-CM | POA: Diagnosis not present

## 2021-11-01 DIAGNOSIS — J189 Pneumonia, unspecified organism: Secondary | ICD-10-CM | POA: Diagnosis not present

## 2021-11-01 DIAGNOSIS — I214 Non-ST elevation (NSTEMI) myocardial infarction: Secondary | ICD-10-CM | POA: Diagnosis not present

## 2021-11-01 DIAGNOSIS — M79601 Pain in right arm: Secondary | ICD-10-CM

## 2021-11-01 DIAGNOSIS — J9601 Acute respiratory failure with hypoxia: Secondary | ICD-10-CM | POA: Diagnosis not present

## 2021-11-01 LAB — CBC
HCT: 25.7 % — ABNORMAL LOW (ref 39.0–52.0)
Hemoglobin: 7.9 g/dL — ABNORMAL LOW (ref 13.0–17.0)
MCH: 27.1 pg (ref 26.0–34.0)
MCHC: 30.7 g/dL (ref 30.0–36.0)
MCV: 88.3 fL (ref 80.0–100.0)
Platelets: 292 10*3/uL (ref 150–400)
RBC: 2.91 MIL/uL — ABNORMAL LOW (ref 4.22–5.81)
RDW: 20.6 % — ABNORMAL HIGH (ref 11.5–15.5)
WBC: 20.9 10*3/uL — ABNORMAL HIGH (ref 4.0–10.5)
nRBC: 0 % (ref 0.0–0.2)

## 2021-11-01 LAB — CULTURE, RESPIRATORY W GRAM STAIN: Culture: NORMAL

## 2021-11-01 LAB — MAGNESIUM: Magnesium: 1.8 mg/dL (ref 1.7–2.4)

## 2021-11-01 LAB — BASIC METABOLIC PANEL
Anion gap: 6 (ref 5–15)
BUN: 30 mg/dL — ABNORMAL HIGH (ref 8–23)
CO2: 29 mmol/L (ref 22–32)
Calcium: 8.9 mg/dL (ref 8.9–10.3)
Chloride: 100 mmol/L (ref 98–111)
Creatinine, Ser: 0.33 mg/dL — ABNORMAL LOW (ref 0.61–1.24)
GFR, Estimated: >60 mL/min (ref >60)
Glucose, Bld: 129 mg/dL — ABNORMAL HIGH (ref 70–99)
Potassium: 4.6 mmol/L (ref 3.5–5.1)
Sodium: 135 mmol/L (ref 135–145)

## 2021-11-01 LAB — URIC ACID: Uric Acid, Serum: 4.2 mg/dL (ref 3.7–8.6)

## 2021-11-01 NOTE — Progress Notes (Signed)
Pulmonary Critical Care Medicine ?[facility] [place] ?  ?PULMONARY CRITICAL CARE SERVICE ? ?PROGRESS NOTE ? ? ? ? ?Troy RamsLarry Earhart  ?ZOX:[id number]  ?DOB: 04/05/1956  ? ?DOA: 10/30/2021 ? ?Referring Physician: Luna KitchensStephanie Brown, MD ? ?HPI: Troy Sampson is a 66 y.o. male being followed for ventilator/airway/oxygen weaning Acute on Chronic Respiratory Failure.  Patient is currently on T collar has been on 28% FiO2 using the PMV ? ?Medications: ?Reviewed on Rounds ? ?Physical Exam: ? ?Vitals: Temperature is 97.1 pulse 117 respiratory rate is 30 blood pressure is 126/68 saturations 99% ? ?Ventilator Settings on T-piece FiO2 28% PMV ? ?General: Comfortable at this time ?Neck: supple ?Cardiovascular: no malignant arrhythmias ?Respiratory: Scattered rhonchi coarse breath sounds ?Skin: no rash seen on limited exam ?Musculoskeletal: No gross abnormality ?Psychiatric:unable to assess ?Neurologic:no involuntary movements   ?   ?   ?Lab Data:  ? ?Basic Metabolic Panel: ?Recent Labs  ?Lab 10/28/21 ?[id number] 10/29/21 ?0335 10/30/21 ?0106 10/30/21 ?0422 11/01/21 ?0301  ?NA 131* 133* 133* 133* 135  ?K 4.3 4.2 4.6 5.0 4.6  ?CL 97* 97* 97* 98 100  ?CO2 28 31 30 31 29   ?GLUCOSE 123* 140* 112* 97 129*  ?BUN 33* 32* 32* 33* 30*  ?CREATININE <0.30* <0.30* <0.30* <0.30* 0.33*  ?CALCIUM 8.7* 8.6* 8.8* 8.8* 8.9  ?MG 1.6*  --   --  1.8 1.8  ?PHOS 2.9  --   --   --   --   ? ? ?ABG: ?No results for input(s): PHART, PCO2ART, PO2ART, HCO3, O2SAT in the last 168 hours. ? ?Liver Function Tests: ?Recent Labs  ?Lab 10/30/21 ?0422  ?AST 78*  ?ALT 68*  ?ALKPHOS 204*  ?BILITOT 0.4  ?PROT 7.2  ?ALBUMIN 1.9*  ? ?No results for input(s): LIPASE, AMYLASE in the last 168 hours. ?No results for input(s): AMMONIA in the last 168 hours. ? ?CBC: ?Recent Labs  ?Lab 10/28/21 ?[id number] 10/29/21 ?0335 10/30/21 ?0106 10/30/21 ?0422 11/01/21 ?0301  ?WBC 22.6* 20.5* 19.0* 21.5* 20.9*  ?NEUTROABS 20.3* 18.0* 16.5*  --   --   ?HGB 8.9* 8.3* 8.5* 8.1* 7.9*  ?HCT 27.4* 26.7*  26.9* 26.3* 25.7*  ?MCV 87.0 88.7 88.5 88.0 88.3  ?PLT 305 288 261 292 292  ? ? ?Cardiac Enzymes: ?No results for input(s): CKTOTAL, CKMB, CKMBINDEX, TROPONINI in the last 168 hours. ? ?BNP (last 3 results) ?Recent Labs  ?  09/22/21 ?0312  ?BNP 471.4*  ? ? ?ProBNP (last 3 results) ?No results for input(s): PROBNP in the last 8760 hours. ? ?Radiological Exams: ?DG Forearm Right ? ?Result Date: 11/01/2021 ?CLINICAL DATA:  Right forearm pain. EXAM: RIGHT FOREARM - 2 VIEW COMPARISON:  None. FINDINGS: There is no evidence of fracture or other focal bone lesions. Soft tissues are unremarkable. IMPRESSION: Negative. Electronically Signed   By: Lupita RaiderJames  Green Jr M.D.   On: 11/01/2021 11:02  ? ?DG Humerus Right ? ?Result Date: 11/01/2021 ?CLINICAL DATA:  Right arm pain. EXAM: RIGHT HUMERUS - 2+ VIEW COMPARISON:  None. FINDINGS: There is no evidence of fracture or other focal bone lesions. Soft tissues are unremarkable. IMPRESSION: Negative. Electronically Signed   By: Lupita RaiderJames  Green Jr M.D.   On: 11/01/2021 11:03  ? ?VAS US UPPER EXTREMITY VENOUS DUPLEX ? ?Result Date: 11/01/2021 ?UPPER VENOUS STUDY  Patient Name:  Troy RamsLARRY Partington  Date of Exam:   11/01/2021 Medical Rec #: [id number]    Accession #:    [id number] Date of Birth: 04/05/1956    Patient Gender: M Patient  Age:   18 years Exam Location:  [facility] Procedure:      VAS [place] UPPER EXTREMITY VENOUS DUPLEX Referring Phys: Judeth Cornfield BROWN --------------------------------------------------------------------------------  Indications: Pain Limitations: Poor ultrasound/tissue interface, line, bandages and patient immobility, patient positioning. Comparison Study: 10/01/2021 - Right:                   No evidence of deep vein thrombosis in the upper extremity.                   However,                   unable to                   visualize the basilic vein and cephalic vein.                    Left:                   No evidence of deep vein thrombosis in the upper extremity.                    However,                   unable to                   visualize the basilic vein and cephalic vein. Performing Technologist: Chanda Busing RVT  Examination Guidelines: A complete evaluation includes B-mode imaging, spectral Doppler, color Doppler, and power Doppler as needed of all accessible portions of each vessel. Bilateral testing is considered an integral part of a complete examination. Limited examinations for reoccurring indications may be performed as noted.  Right Findings: +----------+------------+---------+-----------+----------+-------+ RIGHT     CompressiblePhasicitySpontaneousPropertiesSummary +----------+------------+---------+-----------+----------+-------+ IJV           Full       Yes       Yes                      +----------+------------+---------+-----------+----------+-------+ Subclavian    Full       Yes       Yes                      +----------+------------+---------+-----------+----------+-------+ Axillary      Full       Yes       Yes                      +----------+------------+---------+-----------+----------+-------+ Brachial      Full       Yes       Yes                      +----------+------------+---------+-----------+----------+-------+ Radial        Full                                          +----------+------------+---------+-----------+----------+-------+ Ulnar         Full                                          +----------+------------+---------+-----------+----------+-------+ Cephalic      Full                                          +----------+------------+---------+-----------+----------+-------+  Basilic       Full                                          +----------+------------+---------+-----------+----------+-------+  Left Findings: +----------+------------+---------+-----------+----------+-------+ LEFT      CompressiblePhasicitySpontaneousPropertiesSummary  +----------+------------+---------+-----------+----------+-------+ Subclavian    Full       Yes       Yes                      +----------+------------+---------+-----------+----------+-------+  Summary:  Right: No evidence of deep vein thrombosis in the upper extremity. No evidence of superficial vein thrombosis in the upper extremity.  Left: No evidence of thrombosis in the subclavian.  *See table(s) above for measurements and observations.  Diagnosing physician: Lemar Livings MD Electronically signed by Lemar Livings MD on 11/01/2021 at 12:48:20 PM.    Final    ? ?Assessment/Plan ?Active Problems: ?  Community acquired pneumonia of left lower lobe of lung ?  Acute respiratory failure with hypoxia (HCC) ?  Acute metabolic encephalopathy ?  NSTEMI (non-ST elevated myocardial infarction) (HCC) ?  Status post tracheostomy (HCC) ? ? ?Acute on chronic respiratory failure hypoxia plan is to continue with the weaning proceed to capping trials if patient is able to tolerate ?Community-acquired pneumonia has been treated with antibiotics we will continue to follow ?Metabolic encephalopathy no change noted overall we will continue with supportive care ?Non-STEMI patient is at baseline ?Tracheostomy advance to capping ? ? ?I have personally seen and evaluated the patient, evaluated laboratory and imaging results, formulated the assessment and plan and placed orders. ?The Patient requires high complexity decision making with multiple systems involvement.  ?Rounds were done with the Respiratory Therapy Director and Staff therapists and discussed with nursing staff also. ? ?Yevonne Pax, MD FCCP ?Pulmonary Critical Care Medicine ?Sleep Medicine ? ?

## 2021-11-01 NOTE — Progress Notes (Signed)
Right upper extremity venous duplex has been completed. ?Preliminary results can be found in CV Proc through chart review.  ? ?11/01/21 8:39 AM ?Troy Sampson RVT   ?

## 2021-11-02 DIAGNOSIS — G9341 Metabolic encephalopathy: Secondary | ICD-10-CM | POA: Diagnosis not present

## 2021-11-02 DIAGNOSIS — J189 Pneumonia, unspecified organism: Secondary | ICD-10-CM | POA: Diagnosis not present

## 2021-11-02 DIAGNOSIS — J9601 Acute respiratory failure with hypoxia: Secondary | ICD-10-CM | POA: Diagnosis not present

## 2021-11-02 DIAGNOSIS — I214 Non-ST elevation (NSTEMI) myocardial infarction: Secondary | ICD-10-CM | POA: Diagnosis not present

## 2021-11-02 NOTE — Progress Notes (Signed)
Pulmonary Critical Care Medicine ?[facility] [place] ?  ?PULMONARY CRITICAL CARE SERVICE ? ?PROGRESS NOTE ? ? ? ? ?Troy Sampson  ?ENI:[id number]  ?DOB: January 28, 1956  ? ?DOA: 10/30/2021 ? ?Referring Physician: Luna Kitchens, MD ? ?HPI: Troy Sampson is a 66 y.o. male being followed for ventilator/airway/oxygen weaning Acute on Chronic Respiratory Failure.  Patient is afebrile resting comfortably without distress patient did have a low-grade fever overnight though ? ?Medications: ?Reviewed on Rounds ? ?Physical Exam: ? ?Vitals: Temperature 99 pulse 93 respiratory 26 blood pressure 148/78 saturations 97% ? ?Ventilator Settings capping on room air ? ?General: Comfortable at this time ?Neck: supple ?Cardiovascular: no malignant arrhythmias ?Respiratory: No rhonchi no rales are noted ?Skin: no rash seen on limited exam ?Musculoskeletal: No gross abnormality ?Psychiatric:unable to assess ?Neurologic:no involuntary movements   ?   ?   ?Lab Data:  ? ?Basic Metabolic Panel: ?Recent Labs  ?Lab 10/28/21 ?6144 10/29/21 ?0335 10/30/21 ?0106 10/30/21 ?0422 11/01/21 ?0301  ?NA 131* 133* 133* 133* 135  ?K 4.3 4.2 4.6 5.0 4.6  ?CL 97* 97* 97* 98 100  ?CO2 28 31 30 31 29   ?GLUCOSE 123* 140* 112* 97 129*  ?BUN 33* 32* 32* 33* 30*  ?CREATININE <0.30* <0.30* <0.30* <0.30* 0.33*  ?CALCIUM 8.7* 8.6* 8.8* 8.8* 8.9  ?MG 1.6*  --   --  1.8 1.8  ?PHOS 2.9  --   --   --   --   ? ? ?ABG: ?No results for input(s): PHART, PCO2ART, PO2ART, HCO3, O2SAT in the last 168 hours. ? ?Liver Function Tests: ?Recent Labs  ?Lab 10/30/21 ?0422  ?AST 78*  ?ALT 68*  ?ALKPHOS 204*  ?BILITOT 0.4  ?PROT 7.2  ?ALBUMIN 1.9*  ? ?No results for input(s): LIPASE, AMYLASE in the last 168 hours. ?No results for input(s): AMMONIA in the last 168 hours. ? ?CBC: ?Recent Labs  ?Lab 10/28/21 ?10/30/21 10/29/21 ?0335 10/30/21 ?0106 10/30/21 ?0422 11/01/21 ?0301  ?WBC 22.6* 20.5* 19.0* 21.5* 20.9*  ?NEUTROABS 20.3* 18.0* 16.5*  --   --   ?HGB 8.9* 8.3* 8.5* 8.1* 7.9*  ?HCT 27.4*  26.7* 26.9* 26.3* 25.7*  ?MCV 87.0 88.7 88.5 88.0 88.3  ?PLT 305 288 261 292 292  ? ? ?Cardiac Enzymes: ?No results for input(s): CKTOTAL, CKMB, CKMBINDEX, TROPONINI in the last 168 hours. ? ?BNP (last 3 results) ?Recent Labs  ?  09/22/21 ?0312  ?BNP 471.4*  ? ? ?ProBNP (last 3 results) ?No results for input(s): PROBNP in the last 8760 hours. ? ?Radiological Exams: ?DG Forearm Right ? ?Result Date: 11/01/2021 ?CLINICAL DATA:  Right forearm pain. EXAM: RIGHT FOREARM - 2 VIEW COMPARISON:  None. FINDINGS: There is no evidence of fracture or other focal bone lesions. Soft tissues are unremarkable. IMPRESSION: Negative. Electronically Signed   By: 01/01/2022 M.D.   On: 11/01/2021 11:02  ? ?DG Humerus Right ? ?Result Date: 11/01/2021 ?CLINICAL DATA:  Right arm pain. EXAM: RIGHT HUMERUS - 2+ VIEW COMPARISON:  None. FINDINGS: There is no evidence of fracture or other focal bone lesions. Soft tissues are unremarkable. IMPRESSION: Negative. Electronically Signed   By: 01/01/2022 M.D.   On: 11/01/2021 11:03  ? ?VAS 01/01/2022 UPPER EXTREMITY VENOUS DUPLEX ? ?Result Date: 11/01/2021 ?UPPER VENOUS STUDY  Patient Name:  Troy Sampson  Date of Exam:   11/01/2021 Medical Rec #: 01/01/2022    Accession #:    [id number] Date of Birth: 10-30-1955    Patient Gender: M Patient Age:  65 years Exam Location:  [facility] Procedure:      VAS [place] UPPER EXTREMITY VENOUS DUPLEX Referring Phys: Judeth Cornfield BROWN --------------------------------------------------------------------------------  Indications: Pain Limitations: Poor ultrasound/tissue interface, line, bandages and patient immobility, patient positioning. Comparison Study: 10/01/2021 - Right:                   No evidence of deep vein thrombosis in the upper extremity.                   However,                   unable to                   visualize the basilic vein and cephalic vein.                    Left:                   No evidence of deep vein thrombosis in the upper extremity.                    However,                   unable to                   visualize the basilic vein and cephalic vein. Performing Technologist: Chanda Busing RVT  Examination Guidelines: A complete evaluation includes B-mode imaging, spectral Doppler, color Doppler, and power Doppler as needed of all accessible portions of each vessel. Bilateral testing is considered an integral part of a complete examination. Limited examinations for reoccurring indications may be performed as noted.  Right Findings: +----------+------------+---------+-----------+----------+-------+ RIGHT     CompressiblePhasicitySpontaneousPropertiesSummary +----------+------------+---------+-----------+----------+-------+ IJV           Full       Yes       Yes                      +----------+------------+---------+-----------+----------+-------+ Subclavian    Full       Yes       Yes                      +----------+------------+---------+-----------+----------+-------+ Axillary      Full       Yes       Yes                      +----------+------------+---------+-----------+----------+-------+ Brachial      Full       Yes       Yes                      +----------+------------+---------+-----------+----------+-------+ Radial        Full                                          +----------+------------+---------+-----------+----------+-------+ Ulnar         Full                                          +----------+------------+---------+-----------+----------+-------+ Cephalic      Full                                          +----------+------------+---------+-----------+----------+-------+  Basilic       Full                                          +----------+------------+---------+-----------+----------+-------+  Left Findings: +----------+------------+---------+-----------+----------+-------+ LEFT      CompressiblePhasicitySpontaneousPropertiesSummary  +----------+------------+---------+-----------+----------+-------+ Subclavian    Full       Yes       Yes                      +----------+------------+---------+-----------+----------+-------+  Summary:  Right: No evidence of deep vein thrombosis in the upper extremity. No evidence of superficial vein thrombosis in the upper extremity.  Left: No evidence of thrombosis in the subclavian.  *See table(s) above for measurements and observations.  Diagnosing physician: Lemar LivingsBrandon Cain MD Electronically signed by Lemar LivingsBrandon Cain MD on 11/01/2021 at 12:48:20 PM.    Final    ? ?Assessment/Plan ?Active Problems: ?  Community acquired pneumonia of left lower lobe of lung ?  Acute respiratory failure with hypoxia (HCC) ?  Acute metabolic encephalopathy ?  NSTEMI (non-ST elevated myocardial infarction) (HCC) ?  Status post tracheostomy (HCC) ? ? ?Acute on chronic respiratory failure hypoxia we will continue with the capping trials to be completing 24 hours ?Community-acquired pneumonia has been treated with antibiotics slowly improving ?Metabolic encephalopathy also slowly improving we will continue to follow along ?Non-STEMI no change overall ?Tracheostomy doing fine with capping trials ? ? ?I have personally seen and evaluated the patient, evaluated laboratory and imaging results, formulated the assessment and plan and placed orders. ?The Patient requires high complexity decision making with multiple systems involvement.  ?Rounds were done with the Respiratory Therapy Director and Staff therapists and discussed with nursing staff also. ? ?Yevonne PaxSaadat A Ceciley Buist, MD FCCP ?Pulmonary Critical Care Medicine ?Sleep Medicine ? ?

## 2021-11-03 ENCOUNTER — Other Ambulatory Visit (HOSPITAL_COMMUNITY): Payer: Self-pay

## 2021-11-03 DIAGNOSIS — G9341 Metabolic encephalopathy: Secondary | ICD-10-CM | POA: Diagnosis not present

## 2021-11-03 DIAGNOSIS — I214 Non-ST elevation (NSTEMI) myocardial infarction: Secondary | ICD-10-CM | POA: Diagnosis not present

## 2021-11-03 DIAGNOSIS — J189 Pneumonia, unspecified organism: Secondary | ICD-10-CM | POA: Diagnosis not present

## 2021-11-03 DIAGNOSIS — J9601 Acute respiratory failure with hypoxia: Secondary | ICD-10-CM | POA: Diagnosis not present

## 2021-11-03 LAB — URINE CULTURE: Culture: >=100000 — AB

## 2021-11-03 NOTE — Progress Notes (Signed)
Pulmonary Critical Care Medicine ?[facility] [place] ?  ?PULMONARY CRITICAL CARE SERVICE ? ?PROGRESS NOTE ? ? ? ? ?Troy Sampson  ?OBS:[id number]  ?DOB: 12/02/1955  ? ?DOA: 10/30/2021 ? ?Referring Physician: Luna Kitchens, MD ? ?HPI: Troy Sampson is a 66 y.o. male being followed for ventilator/airway/oxygen weaning Acute on Chronic Respiratory Failure.  Patient is afebrile resting comfortably has been capping going on 72 hours today however patient failed swallowing assessment apparently ? ?Medications: ?Reviewed on Rounds ? ?Physical Exam: ? ?Vitals: Temperature is 98.0 pulse 80 respiratory 20 blood pressure is 122/70 saturations 98% ? ?Ventilator Settings capping off the ventilator ? ?General: Comfortable at this time ?Neck: supple ?Cardiovascular: no malignant arrhythmias ?Respiratory: Scattered rhonchi very coarse breath sounds ?Skin: no rash seen on limited exam ?Musculoskeletal: No gross abnormality ?Psychiatric:unable to assess ?Neurologic:no involuntary movements   ?   ?   ?Lab Data:  ? ?Basic Metabolic Panel: ?Recent Labs  ?Lab 10/28/21 ?4765 10/29/21 ?0335 10/30/21 ?0106 10/30/21 ?0422 11/01/21 ?0301  ?NA 131* 133* 133* 133* 135  ?K 4.3 4.2 4.6 5.0 4.6  ?CL 97* 97* 97* 98 100  ?CO2 28 31 30 31 29   ?GLUCOSE 123* 140* 112* 97 129*  ?BUN 33* 32* 32* 33* 30*  ?CREATININE <0.30* <0.30* <0.30* <0.30* 0.33*  ?CALCIUM 8.7* 8.6* 8.8* 8.8* 8.9  ?MG 1.6*  --   --  1.8 1.8  ?PHOS 2.9  --   --   --   --   ? ? ?ABG: ?No results for input(s): PHART, PCO2ART, PO2ART, HCO3, O2SAT in the last 168 hours. ? ?Liver Function Tests: ?Recent Labs  ?Lab 10/30/21 ?0422  ?AST 78*  ?ALT 68*  ?ALKPHOS 204*  ?BILITOT 0.4  ?PROT 7.2  ?ALBUMIN 1.9*  ? ?No results for input(s): LIPASE, AMYLASE in the last 168 hours. ?No results for input(s): AMMONIA in the last 168 hours. ? ?CBC: ?Recent Labs  ?Lab 10/28/21 ?10/30/21 10/29/21 ?0335 10/30/21 ?0106 10/30/21 ?0422 11/01/21 ?0301  ?WBC 22.6* 20.5* 19.0* 21.5* 20.9*  ?NEUTROABS 20.3* 18.0*  16.5*  --   --   ?HGB 8.9* 8.3* 8.5* 8.1* 7.9*  ?HCT 27.4* 26.7* 26.9* 26.3* 25.7*  ?MCV 87.0 88.7 88.5 88.0 88.3  ?PLT 305 288 261 292 292  ? ? ?Cardiac Enzymes: ?No results for input(s): CKTOTAL, CKMB, CKMBINDEX, TROPONINI in the last 168 hours. ? ?BNP (last 3 results) ?Recent Labs  ?  09/22/21 ?0312  ?BNP 471.4*  ? ? ?ProBNP (last 3 results) ?No results for input(s): PROBNP in the last 8760 hours. ? ?Radiological Exams: ?No results found. ? ?Assessment/Plan ?Active Problems: ?  Community acquired pneumonia of left lower lobe of lung ?  Acute respiratory failure with hypoxia (HCC) ?  Acute metabolic encephalopathy ?  NSTEMI (non-ST elevated myocardial infarction) (HCC) ?  Status post tracheostomy (HCC) ? ? ?Acute on chronic respiratory failure with hypoxia plan is to hold off on decannulation for now will reassess again tomorrow ?Community-acquired pneumonia has been treated with antibiotics we will continue with supportive care ?Metabolic encephalopathy no change noted overall ?Tracheostomy remains in place ?Non-STEMI no change overall ? ? ?I have personally seen and evaluated the patient, evaluated laboratory and imaging results, formulated the assessment and plan and placed orders. ?The Patient requires high complexity decision making with multiple systems involvement.  ?Rounds were done with the Respiratory Therapy Director and Staff therapists and discussed with nursing staff also. ? ?09/24/21, MD FCCP ?Pulmonary Critical Care Medicine ?Sleep Medicine ? ?

## 2021-11-04 DIAGNOSIS — J9601 Acute respiratory failure with hypoxia: Secondary | ICD-10-CM | POA: Diagnosis not present

## 2021-11-04 DIAGNOSIS — I214 Non-ST elevation (NSTEMI) myocardial infarction: Secondary | ICD-10-CM | POA: Diagnosis not present

## 2021-11-04 DIAGNOSIS — G9341 Metabolic encephalopathy: Secondary | ICD-10-CM | POA: Diagnosis not present

## 2021-11-04 DIAGNOSIS — J189 Pneumonia, unspecified organism: Secondary | ICD-10-CM | POA: Diagnosis not present

## 2021-11-04 LAB — CULTURE, BLOOD (ROUTINE X 2)
Culture: NO GROWTH
Culture: NO GROWTH
Special Requests: ADEQUATE
Special Requests: ADEQUATE

## 2021-11-04 LAB — MAGNESIUM: Magnesium: 1.6 mg/dL — ABNORMAL LOW (ref 1.7–2.4)

## 2021-11-04 LAB — BASIC METABOLIC PANEL
Anion gap: 5 (ref 5–15)
BUN: 27 mg/dL — ABNORMAL HIGH (ref 8–23)
CO2: 28 mmol/L (ref 22–32)
Calcium: 8.9 mg/dL (ref 8.9–10.3)
Chloride: 106 mmol/L (ref 98–111)
Creatinine, Ser: <0.3 mg/dL — ABNORMAL LOW (ref 0.61–1.24)
Glucose, Bld: 116 mg/dL — ABNORMAL HIGH (ref 70–99)
Potassium: 4.6 mmol/L (ref 3.5–5.1)
Sodium: 139 mmol/L (ref 135–145)

## 2021-11-04 LAB — CBC
HCT: 24.4 % — ABNORMAL LOW (ref 39.0–52.0)
Hemoglobin: 7.3 g/dL — ABNORMAL LOW (ref 13.0–17.0)
MCH: 27.3 pg (ref 26.0–34.0)
MCHC: 29.9 g/dL — ABNORMAL LOW (ref 30.0–36.0)
MCV: 91.4 fL (ref 80.0–100.0)
Platelets: 294 10*3/uL (ref 150–400)
RBC: 2.67 MIL/uL — ABNORMAL LOW (ref 4.22–5.81)
RDW: 20.2 % — ABNORMAL HIGH (ref 11.5–15.5)
WBC: 13 10*3/uL — ABNORMAL HIGH (ref 4.0–10.5)
nRBC: 0 % (ref 0.0–0.2)

## 2021-11-04 LAB — OCCULT BLOOD X 1 CARD TO LAB, STOOL: Fecal Occult Bld: POSITIVE — AB

## 2021-11-04 NOTE — Progress Notes (Addendum)
Pulmonary Critical Care Medicine ?[facility] [place] ?  ?PULMONARY CRITICAL CARE SERVICE ? ?PROGRESS NOTE ? ? ? ? ?Troy Sampson  ?[id number]  ?DOB: 1955/10/30  ? ?DOA: 10/30/2021 ? ?Referring Physician: Luna Kitchens, MD ? ?HPI: Troy Sampson is a 66 y.o. male being followed for ventilator/airway/oxygen weaning Acute on Chronic Respiratory Failure.  Patient is seen sitting up in bed.  Currently capped and on room air.  He has now been capped for almost 72 hours.  Order to decannulate at 1400 placed.  No acute distress, no acute overnight events ? ?Medications: ?Reviewed on Rounds ? ?Physical Exam: ? ?Vitals: Temp 98.0, pulse 84, respirations 19, BP 122/68, SPO2 97% ? ?Ventilator Settings capped and on room air ? ?General: Comfortable at this time ?Neck: supple ?Cardiovascular: no malignant arrhythmias ?Respiratory: Bilaterally clear in all lobes ?Skin: no rash seen on limited exam ?Musculoskeletal: No gross abnormality ?Psychiatric:unable to assess ?Neurologic:no involuntary movements   ?   ?   ?Lab Data:  ? ?Basic Metabolic Panel: ?Recent Labs  ?Lab 10/29/21 ?0335 10/30/21 ?0106 10/30/21 ?0422 11/01/21 ?0301 11/04/21 ?0459  ?NA 133* 133* 133* 135 139  ?K 4.2 4.6 5.0 4.6 4.6  ?CL 97* 97* 98 100 106  ?CO2 31 30 31 29 28   ?GLUCOSE 140* 112* 97 129* 116*  ?BUN 32* 32* 33* 30* 27*  ?CREATININE <0.30* <0.30* <0.30* 0.33* <0.30*  ?CALCIUM 8.6* 8.8* 8.8* 8.9 8.9  ?MG  --   --  1.8 1.8 1.6*  ? ? ?ABG: ?No results for input(s): PHART, PCO2ART, PO2ART, HCO3, O2SAT in the last 168 hours. ? ?Liver Function Tests: ?Recent Labs  ?Lab 10/30/21 ?0422  ?AST 78*  ?ALT 68*  ?ALKPHOS 204*  ?BILITOT 0.4  ?PROT 7.2  ?ALBUMIN 1.9*  ? ?No results for input(s): LIPASE, AMYLASE in the last 168 hours. ?No results for input(s): AMMONIA in the last 168 hours. ? ?CBC: ?Recent Labs  ?Lab 10/29/21 ?0335 10/30/21 ?0106 10/30/21 ?0422 11/01/21 ?0301 11/04/21 ?0459  ?WBC 20.5* 19.0* 21.5* 20.9* 13.0*  ?NEUTROABS 18.0* 16.5*  --   --   --    ?HGB 8.3* 8.5* 8.1* 7.9* 7.3*  ?HCT 26.7* 26.9* 26.3* 25.7* 24.4*  ?MCV 88.7 88.5 88.0 88.3 91.4  ?PLT 288 261 292 292 294  ? ? ?Cardiac Enzymes: ?No results for input(s): CKTOTAL, CKMB, CKMBINDEX, TROPONINI in the last 168 hours. ? ?BNP (last 3 results) ?Recent Labs  ?  09/22/21 ?0312  ?BNP 471.4*  ? ? ?ProBNP (last 3 results) ?No results for input(s): PROBNP in the last 8760 hours. ? ?Radiological Exams: ?No results found. ? ?Assessment/Plan ?Active Problems: ?  Community acquired pneumonia of left lower lobe of lung ?  Acute respiratory failure with hypoxia (HCC) ?  Acute metabolic encephalopathy ?  NSTEMI (non-ST elevated myocardial infarction) (HCC) ?  Status post tracheostomy (HCC) ? ? ?Acute on chronic respiratory failure with hypoxia-order for decannulation has been placed for today at 1400.  Patient on room air and tolerating well. ?Community-acquired pneumonia-has been treated with antibiotics, continue with supportive care. ?Metabolic encephalopathy-overall no change has been noted ?Tracheostomy-remains in place.  Order for decannulation placed this morning ?Non-STEMI-overall no change, continue telemetry ? ? ?I have personally seen and evaluated the patient, evaluated laboratory and imaging results, formulated the assessment and plan and placed orders. ?The Patient requires high complexity decision making with multiple systems involvement.  ?Rounds were done with the Respiratory Therapy Director and Staff therapists and discussed with nursing staff also. ? ?Reika Callanan  Richardson Dopp, MD FCCP ?Pulmonary Critical Care Medicine ?Sleep Medicine  ?

## 2021-11-05 DIAGNOSIS — I214 Non-ST elevation (NSTEMI) myocardial infarction: Secondary | ICD-10-CM | POA: Diagnosis not present

## 2021-11-05 DIAGNOSIS — J189 Pneumonia, unspecified organism: Secondary | ICD-10-CM | POA: Diagnosis not present

## 2021-11-05 DIAGNOSIS — G9341 Metabolic encephalopathy: Secondary | ICD-10-CM | POA: Diagnosis not present

## 2021-11-05 DIAGNOSIS — J9601 Acute respiratory failure with hypoxia: Secondary | ICD-10-CM | POA: Diagnosis not present

## 2021-11-05 LAB — CBC
HCT: 25.3 % — ABNORMAL LOW (ref 39.0–52.0)
Hemoglobin: 8.1 g/dL — ABNORMAL LOW (ref 13.0–17.0)
MCH: 28.4 pg (ref 26.0–34.0)
MCHC: 32 g/dL (ref 30.0–36.0)
MCV: 88.8 fL (ref 80.0–100.0)
Platelets: 309 10*3/uL (ref 150–400)
RBC: 2.85 MIL/uL — ABNORMAL LOW (ref 4.22–5.81)
RDW: 20 % — ABNORMAL HIGH (ref 11.5–15.5)
WBC: 12.4 10*3/uL — ABNORMAL HIGH (ref 4.0–10.5)
nRBC: 0 % (ref 0.0–0.2)

## 2021-11-05 LAB — BASIC METABOLIC PANEL
Anion gap: 5 (ref 5–15)
BUN: 28 mg/dL — ABNORMAL HIGH (ref 8–23)
CO2: 28 mmol/L (ref 22–32)
Calcium: 8.9 mg/dL (ref 8.9–10.3)
Chloride: 103 mmol/L (ref 98–111)
Creatinine, Ser: <0.3 mg/dL — ABNORMAL LOW (ref 0.61–1.24)
Glucose, Bld: 107 mg/dL — ABNORMAL HIGH (ref 70–99)
Potassium: 4.8 mmol/L (ref 3.5–5.1)
Sodium: 136 mmol/L (ref 135–145)

## 2021-11-05 LAB — MAGNESIUM: Magnesium: 1.7 mg/dL (ref 1.7–2.4)

## 2021-11-05 LAB — OCCULT BLOOD X 1 CARD TO LAB, STOOL: Fecal Occult Bld: POSITIVE — AB

## 2021-11-05 NOTE — Progress Notes (Addendum)
Pulmonary Critical Care Medicine ?[facility] [place] ?  ?PULMONARY CRITICAL CARE SERVICE ? ?PROGRESS NOTE ? ? ? ? ?[name]  ?[id number]  ?DOB: [birth year]  ? ?DOA: 10/30/2021 ? ?Referring Physician: Luna Kitchens, MD ? ?HPI: Troy Sampson is a 66 y.o. male being followed for ventilator/airway/oxygen weaning Acute on Chronic Respiratory Failure.  Patient seen sitting up in bed.  He was decannulated yesterday.  Currently on room air.  No acute distress, no acute overnight events ? ?Medications: ?Reviewed on Rounds ? ?Physical Exam: ? ?Vitals: Temp 97.0, pulse 65, respirations 20, BP 110/67, SPO2 97% ? ?Ventilator Settings room air ? ?General: Comfortable at this time ?Neck: supple ?Cardiovascular: no malignant arrhythmias ?Respiratory: Left lung clear in all lobes, right lung diminished in all lobes ?Skin: no rash seen on limited exam ?Musculoskeletal: No gross abnormality ?Psychiatric:unable to assess ?Neurologic:no involuntary movements   ?   ?   ?Lab Data:  ? ?Basic Metabolic Panel: ?Recent Labs  ?Lab 10/30/21 ?0106 10/30/21 ?0422 11/01/21 ?0301 11/04/21 ?2297 11/05/21 ?9892  ?NA 133* 133* 135 139 136  ?K 4.6 5.0 4.6 4.6 4.8  ?CL 97* 98 100 106 103  ?CO2 30 31 29 28 28   ?GLUCOSE 112* 97 129* 116* 107*  ?BUN 32* 33* 30* 27* 28*  ?CREATININE <0.30* <0.30* 0.33* <0.30* <0.30*  ?CALCIUM 8.8* 8.8* 8.9 8.9 8.9  ?MG  --  1.8 1.8 1.6* 1.7  ? ? ?ABG: ?No results for input(s): PHART, PCO2ART, PO2ART, HCO3, O2SAT in the last 168 hours. ? ?Liver Function Tests: ?Recent Labs  ?Lab 10/30/21 ?0422  ?AST 78*  ?ALT 68*  ?ALKPHOS 204*  ?BILITOT 0.4  ?PROT 7.2  ?ALBUMIN 1.9*  ? ?No results for input(s): LIPASE, AMYLASE in the last 168 hours. ?No results for input(s): AMMONIA in the last 168 hours. ? ?CBC: ?Recent Labs  ?Lab 10/30/21 ?0106 10/30/21 ?0422 11/01/21 ?0301 11/04/21 ?01/04/22 11/05/21 ?01/05/22  ?WBC 19.0* 21.5* 20.9* 13.0* 12.4*  ?NEUTROABS 16.5*  --   --   --   --   ?HGB 8.5* 8.1* 7.9* 7.3* 8.1*  ?HCT 26.9* 26.3*  25.7* 24.4* 25.3*  ?MCV 88.5 88.0 88.3 91.4 88.8  ?PLT 261 292 292 294 309  ? ? ?Cardiac Enzymes: ?No results for input(s): CKTOTAL, CKMB, CKMBINDEX, TROPONINI in the last 168 hours. ? ?BNP (last 3 results) ?Recent Labs  ?  09/22/21 ?0312  ?BNP 471.4*  ? ? ?ProBNP (last 3 results) ?No results for input(s): PROBNP in the last 8760 hours. ? ?Radiological Exams: ?No results found. ? ?Assessment/Plan ?Active Problems: ?  Community acquired pneumonia of left lower lobe of lung ?  Acute respiratory failure with hypoxia (HCC) ?  Acute metabolic encephalopathy ?  NSTEMI (non-ST elevated myocardial infarction) (HCC) ?  Status post tracheostomy (HCC) ? ? ?Acute on chronic respiratory failure with hypoxia-patient was decannulated yesterday.  Currently on room air and tolerating well. ?Community-acquired pneumonia-has been treated with antibiotics, continue with supportive care. ?Metabolic encephalopathy-overall there has been no noticeable change. ?Tracheostomy-patient was decannulated yesterday, currently on room air and tolerating well.  Stoma bandage in place ?Non-STEMI-overall no change, continue with telemetry. ? ?At this time it is appropriate that pulmonology signed off.  We appreciate the consult.  Please feel free to reconsult for further needs or recommendations. ? ?I have personally seen and evaluated the patient, evaluated laboratory and imaging results, formulated the assessment and plan and placed orders. ?The Patient requires high complexity decision making with multiple systems involvement.  ?Rounds were  done with the Respiratory Therapy Director and Staff therapists and discussed with nursing staff also. ? ?Allyne Gee, MD FCCP ?Pulmonary Critical Care Medicine ?Sleep Medicine  ?

## 2021-11-06 LAB — MAGNESIUM
Magnesium: 1.7 mg/dL (ref 1.7–2.4)
Magnesium: 1.8 mg/dL (ref 1.7–2.4)

## 2021-11-07 LAB — CBC
HCT: 25.4 % — ABNORMAL LOW (ref 39.0–52.0)
Hemoglobin: 8 g/dL — ABNORMAL LOW (ref 13.0–17.0)
MCH: 28 pg (ref 26.0–34.0)
MCHC: 31.5 g/dL (ref 30.0–36.0)
MCV: 88.8 fL (ref 80.0–100.0)
Platelets: 313 10*3/uL (ref 150–400)
RBC: 2.86 MIL/uL — ABNORMAL LOW (ref 4.22–5.81)
RDW: 20.4 % — ABNORMAL HIGH (ref 11.5–15.5)
WBC: 12.9 10*3/uL — ABNORMAL HIGH (ref 4.0–10.5)
nRBC: 0 % (ref 0.0–0.2)

## 2021-11-13 LAB — CBC
HCT: 27.8 % — ABNORMAL LOW (ref 39.0–52.0)
Hemoglobin: 8.4 g/dL — ABNORMAL LOW (ref 13.0–17.0)
MCH: 28.1 pg (ref 26.0–34.0)
MCHC: 30.2 g/dL (ref 30.0–36.0)
MCV: 93 fL (ref 80.0–100.0)
Platelets: 357 10*3/uL (ref 150–400)
RBC: 2.99 MIL/uL — ABNORMAL LOW (ref 4.22–5.81)
RDW: 22.5 % — ABNORMAL HIGH (ref 11.5–15.5)
WBC: 11.8 10*3/uL — ABNORMAL HIGH (ref 4.0–10.5)
nRBC: 0 % (ref 0.0–0.2)

## 2021-11-13 LAB — BASIC METABOLIC PANEL
Anion gap: 7 (ref 5–15)
BUN: 31 mg/dL — ABNORMAL HIGH (ref 8–23)
CO2: 26 mmol/L (ref 22–32)
Calcium: 9.4 mg/dL (ref 8.9–10.3)
Chloride: 103 mmol/L (ref 98–111)
Creatinine, Ser: 0.38 mg/dL — ABNORMAL LOW (ref 0.61–1.24)
GFR, Estimated: >60 mL/min (ref >60)
Glucose, Bld: 110 mg/dL — ABNORMAL HIGH (ref 70–99)
Potassium: 4.8 mmol/L (ref 3.5–5.1)
Sodium: 136 mmol/L (ref 135–145)

## 2021-11-13 LAB — MAGNESIUM: Magnesium: 1.6 mg/dL — ABNORMAL LOW (ref 1.7–2.4)

## 2021-11-14 LAB — MAGNESIUM: Magnesium: 1.7 mg/dL (ref 1.7–2.4)

## 2021-11-15 LAB — MAGNESIUM: Magnesium: 1.8 mg/dL (ref 1.7–2.4)

## 2021-11-16 ENCOUNTER — Other Ambulatory Visit (HOSPITAL_COMMUNITY): Payer: Self-pay

## 2021-11-16 LAB — COMPREHENSIVE METABOLIC PANEL
ALT: 73 U/L — ABNORMAL HIGH (ref 0–44)
AST: 109 U/L — ABNORMAL HIGH (ref 15–41)
Albumin: 2.1 g/dL — ABNORMAL LOW (ref 3.5–5.0)
Alkaline Phosphatase: 201 U/L — ABNORMAL HIGH (ref 38–126)
Anion gap: 8 (ref 5–15)
BUN: 29 mg/dL — ABNORMAL HIGH (ref 8–23)
CO2: 27 mmol/L (ref 22–32)
Calcium: 8.7 mg/dL — ABNORMAL LOW (ref 8.9–10.3)
Chloride: 100 mmol/L (ref 98–111)
Creatinine, Ser: 0.43 mg/dL — ABNORMAL LOW (ref 0.61–1.24)
GFR, Estimated: >60 mL/min (ref >60)
Glucose, Bld: 112 mg/dL — ABNORMAL HIGH (ref 70–99)
Potassium: 4.1 mmol/L (ref 3.5–5.1)
Sodium: 135 mmol/L (ref 135–145)
Total Bilirubin: 0.7 mg/dL (ref 0.3–1.2)
Total Protein: 7.1 g/dL (ref 6.5–8.1)

## 2021-11-16 LAB — CBC
HCT: 25.1 % — ABNORMAL LOW (ref 39.0–52.0)
Hemoglobin: 7.6 g/dL — ABNORMAL LOW (ref 13.0–17.0)
MCH: 29.2 pg (ref 26.0–34.0)
MCHC: 30.3 g/dL (ref 30.0–36.0)
MCV: 96.5 fL (ref 80.0–100.0)
Platelets: 336 10*3/uL (ref 150–400)
RBC: 2.6 MIL/uL — ABNORMAL LOW (ref 4.22–5.81)
RDW: 22.9 % — ABNORMAL HIGH (ref 11.5–15.5)
WBC: 12.4 10*3/uL — ABNORMAL HIGH (ref 4.0–10.5)
nRBC: 0.2 % (ref 0.0–0.2)

## 2021-11-16 LAB — MAGNESIUM: Magnesium: 1.9 mg/dL (ref 1.7–2.4)

## 2021-11-20 LAB — COMPREHENSIVE METABOLIC PANEL
ALT: 87 U/L — ABNORMAL HIGH (ref 0–44)
AST: 134 U/L — ABNORMAL HIGH (ref 15–41)
Albumin: 2 g/dL — ABNORMAL LOW (ref 3.5–5.0)
Alkaline Phosphatase: 206 U/L — ABNORMAL HIGH (ref 38–126)
Anion gap: 4 — ABNORMAL LOW (ref 5–15)
BUN: 25 mg/dL — ABNORMAL HIGH (ref 8–23)
CO2: 28 mmol/L (ref 22–32)
Calcium: 9 mg/dL (ref 8.9–10.3)
Chloride: 104 mmol/L (ref 98–111)
Creatinine, Ser: 0.43 mg/dL — ABNORMAL LOW (ref 0.61–1.24)
GFR, Estimated: >60 mL/min (ref >60)
Glucose, Bld: 116 mg/dL — ABNORMAL HIGH (ref 70–99)
Potassium: 4.6 mmol/L (ref 3.5–5.1)
Sodium: 136 mmol/L (ref 135–145)
Total Bilirubin: 0.4 mg/dL (ref 0.3–1.2)
Total Protein: 7 g/dL (ref 6.5–8.1)

## 2021-11-20 LAB — CBC
HCT: 23.9 % — ABNORMAL LOW (ref 39.0–52.0)
Hemoglobin: 7.3 g/dL — ABNORMAL LOW (ref 13.0–17.0)
MCH: 30.5 pg (ref 26.0–34.0)
MCHC: 30.5 g/dL (ref 30.0–36.0)
MCV: 100 fL (ref 80.0–100.0)
Platelets: 311 10*3/uL (ref 150–400)
RBC: 2.39 MIL/uL — ABNORMAL LOW (ref 4.22–5.81)
RDW: 22.5 % — ABNORMAL HIGH (ref 11.5–15.5)
WBC: 10.6 10*3/uL — ABNORMAL HIGH (ref 4.0–10.5)
nRBC: 0.3 % — ABNORMAL HIGH (ref 0.0–0.2)

## 2021-11-24 ENCOUNTER — Other Ambulatory Visit (HOSPITAL_COMMUNITY): Payer: Self-pay

## 2021-11-25 LAB — BASIC METABOLIC PANEL
Anion gap: 7 (ref 5–15)
BUN: 22 mg/dL (ref 8–23)
CO2: 26 mmol/L (ref 22–32)
Calcium: 9.4 mg/dL (ref 8.9–10.3)
Chloride: 101 mmol/L (ref 98–111)
Creatinine, Ser: 0.32 mg/dL — ABNORMAL LOW (ref 0.61–1.24)
GFR, Estimated: >60 mL/min (ref >60)
Glucose, Bld: 88 mg/dL (ref 70–99)
Potassium: 4.1 mmol/L (ref 3.5–5.1)
Sodium: 134 mmol/L — ABNORMAL LOW (ref 135–145)

## 2021-11-25 LAB — CBC
HCT: 31.9 % — ABNORMAL LOW (ref 39.0–52.0)
Hemoglobin: 9.6 g/dL — ABNORMAL LOW (ref 13.0–17.0)
MCH: 29.5 pg (ref 26.0–34.0)
MCHC: 30.1 g/dL (ref 30.0–36.0)
MCV: 98.2 fL (ref 80.0–100.0)
Platelets: 278 10*3/uL (ref 150–400)
RBC: 3.25 MIL/uL — ABNORMAL LOW (ref 4.22–5.81)
RDW: 20.8 % — ABNORMAL HIGH (ref 11.5–15.5)
WBC: 7.1 10*3/uL (ref 4.0–10.5)
nRBC: 0 % (ref 0.0–0.2)

## 2021-11-25 LAB — MAGNESIUM: Magnesium: 1.6 mg/dL — ABNORMAL LOW (ref 1.7–2.4)

## 2021-11-26 LAB — MAGNESIUM: Magnesium: 1.6 mg/dL — ABNORMAL LOW (ref 1.7–2.4)

## 2021-11-27 LAB — MAGNESIUM: Magnesium: 1.7 mg/dL (ref 1.7–2.4)

## 2021-11-28 LAB — MAGNESIUM: Magnesium: 1.5 mg/dL — ABNORMAL LOW (ref 1.7–2.4)

## 2021-11-28 LAB — BASIC METABOLIC PANEL
Anion gap: 5 (ref 5–15)
BUN: 20 mg/dL (ref 8–23)
CO2: 27 mmol/L (ref 22–32)
Calcium: 9.1 mg/dL (ref 8.9–10.3)
Chloride: 99 mmol/L (ref 98–111)
Creatinine, Ser: 0.42 mg/dL — ABNORMAL LOW (ref 0.61–1.24)
GFR, Estimated: >60 mL/min (ref >60)
Glucose, Bld: 84 mg/dL (ref 70–99)
Potassium: 4.6 mmol/L (ref 3.5–5.1)
Sodium: 131 mmol/L — ABNORMAL LOW (ref 135–145)

## 2021-11-30 LAB — BASIC METABOLIC PANEL
Anion gap: 6 (ref 5–15)
BUN: 21 mg/dL (ref 8–23)
CO2: 26 mmol/L (ref 22–32)
Calcium: 9.1 mg/dL (ref 8.9–10.3)
Chloride: 97 mmol/L — ABNORMAL LOW (ref 98–111)
Creatinine, Ser: 0.39 mg/dL — ABNORMAL LOW (ref 0.61–1.24)
GFR, Estimated: >60 mL/min (ref >60)
Glucose, Bld: 88 mg/dL (ref 70–99)
Potassium: 4.3 mmol/L (ref 3.5–5.1)
Sodium: 129 mmol/L — ABNORMAL LOW (ref 135–145)

## 2021-11-30 LAB — MAGNESIUM
Magnesium: 1.5 mg/dL — ABNORMAL LOW (ref 1.7–2.4)
Magnesium: 2.2 mg/dL (ref 1.7–2.4)

## 2021-12-03 LAB — CBC
HCT: 36.5 % — ABNORMAL LOW (ref 39.0–52.0)
Hemoglobin: 11.6 g/dL — ABNORMAL LOW (ref 13.0–17.0)
MCH: 29.9 pg (ref 26.0–34.0)
MCHC: 31.8 g/dL (ref 30.0–36.0)
MCV: 94.1 fL (ref 80.0–100.0)
Platelets: 245 10*3/uL (ref 150–400)
RBC: 3.88 MIL/uL — ABNORMAL LOW (ref 4.22–5.81)
RDW: 17.2 % — ABNORMAL HIGH (ref 11.5–15.5)
WBC: 5.5 10*3/uL (ref 4.0–10.5)
nRBC: 0 % (ref 0.0–0.2)

## 2021-12-03 LAB — BASIC METABOLIC PANEL
Anion gap: 8 (ref 5–15)
BUN: 16 mg/dL (ref 8–23)
CO2: 27 mmol/L (ref 22–32)
Calcium: 9.8 mg/dL (ref 8.9–10.3)
Chloride: 95 mmol/L — ABNORMAL LOW (ref 98–111)
Creatinine, Ser: 0.37 mg/dL — ABNORMAL LOW (ref 0.61–1.24)
GFR, Estimated: >60 mL/min (ref >60)
Glucose, Bld: 71 mg/dL (ref 70–99)
Potassium: 4.5 mmol/L (ref 3.5–5.1)
Sodium: 130 mmol/L — ABNORMAL LOW (ref 135–145)

## 2021-12-03 LAB — MAGNESIUM: Magnesium: 1.5 mg/dL — ABNORMAL LOW (ref 1.7–2.4)

## 2021-12-03 LAB — C-REACTIVE PROTEIN: CRP: 0.5 mg/dL (ref <1.0)

## 2021-12-16 ENCOUNTER — Other Ambulatory Visit (HOSPITAL_COMMUNITY): Payer: Self-pay | Admitting: Nurse Practitioner

## 2021-12-16 DIAGNOSIS — Z431 Encounter for attention to gastrostomy: Secondary | ICD-10-CM

## 2021-12-28 ENCOUNTER — Other Ambulatory Visit: Payer: Self-pay | Admitting: Student

## 2021-12-29 ENCOUNTER — Ambulatory Visit (HOSPITAL_COMMUNITY)
Admission: RE | Admit: 2021-12-29 | Discharge: 2021-12-29 | Disposition: A | Discharge status: Home / Self Care | Payer: No Typology Code available for payment source | Source: Ambulatory Visit | Attending: Nurse Practitioner | Admitting: Nurse Practitioner

## 2021-12-29 DIAGNOSIS — Z431 Encounter for attention to gastrostomy: Secondary | ICD-10-CM | POA: Diagnosis present

## 2021-12-29 HISTORY — PX: IR GASTROSTOMY TUBE REMOVAL: IMG5492

## 2022-04-26 ENCOUNTER — Other Ambulatory Visit: Payer: Self-pay | Admitting: Radiology

## 2022-04-26 DIAGNOSIS — R1310 Dysphagia, unspecified: Secondary | ICD-10-CM

## 2022-04-27 ENCOUNTER — Inpatient Hospital Stay (HOSPITAL_COMMUNITY): Admit: 2022-04-27 | Payer: Self-pay

## 2023-03-04 IMAGING — DX DG CHEST 1V PORT
1 series · 1 of 1 positions shown · non-contrast
Comparison: None.

CLINICAL DATA: Shortness of breath

EXAM:
PORTABLE CHEST 1 VIEW

[chest ap]
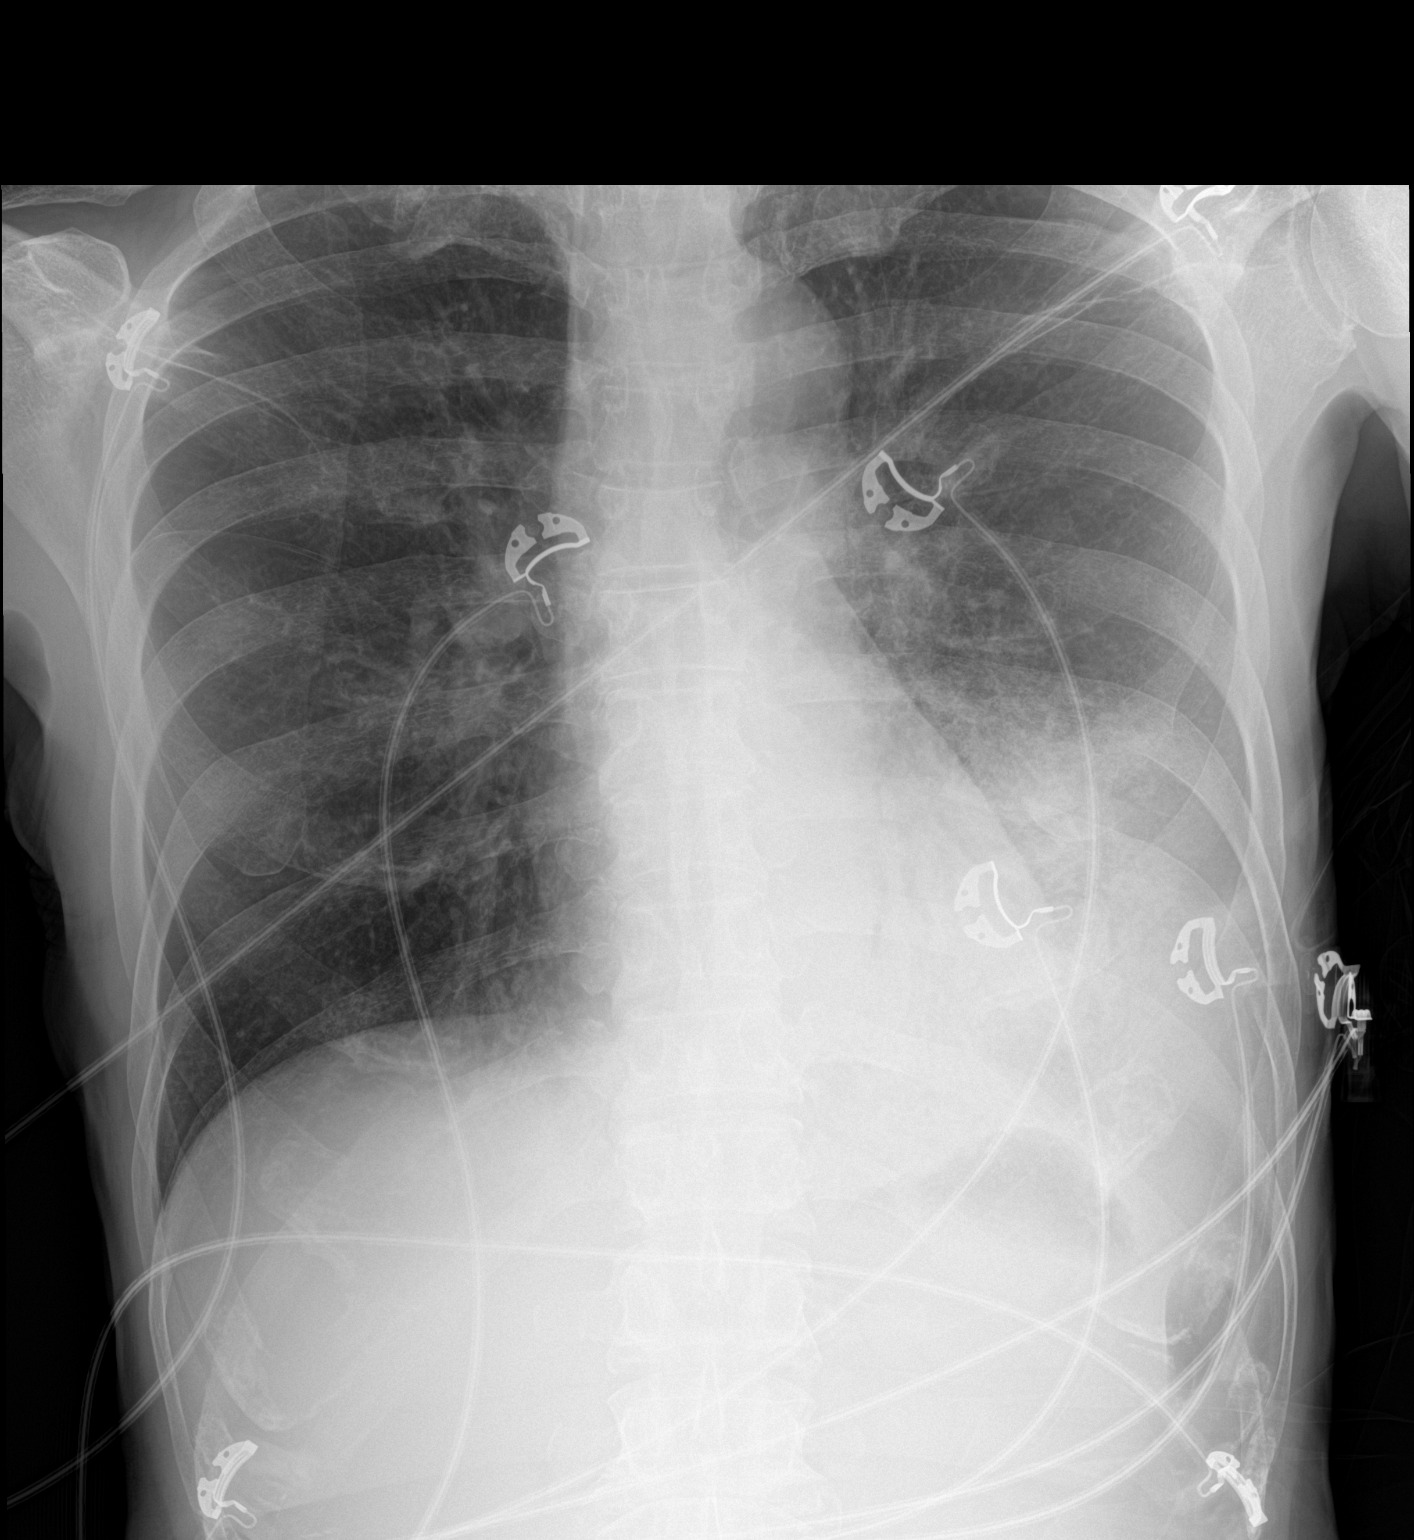

[1 of 1 positions shown; findings below may reference images not displayed]

FINDINGS: There is alveolar infiltrate in the left lower lung fields with air
bronchogram. Left diaphragm is obscured. There is blunting of left
lateral costophrenic angle. There is no pneumothorax.
IMPRESSION: There is large infiltrate in the left lower lung fields suggesting
pneumonia. Follow-up studies until complete clearing occurs should
be considered to rule out any obstructing lesions in the bronchus.
Left pleural effusion.

## 2023-03-15 IMAGING — DX DG ABDOMEN 1V
1 series · 1 of 1 positions shown · non-contrast
Comparison: 10/01/2021

CLINICAL DATA: Check gastric catheter placement

EXAM:
ABDOMEN - 1 VIEW

[abdomen]
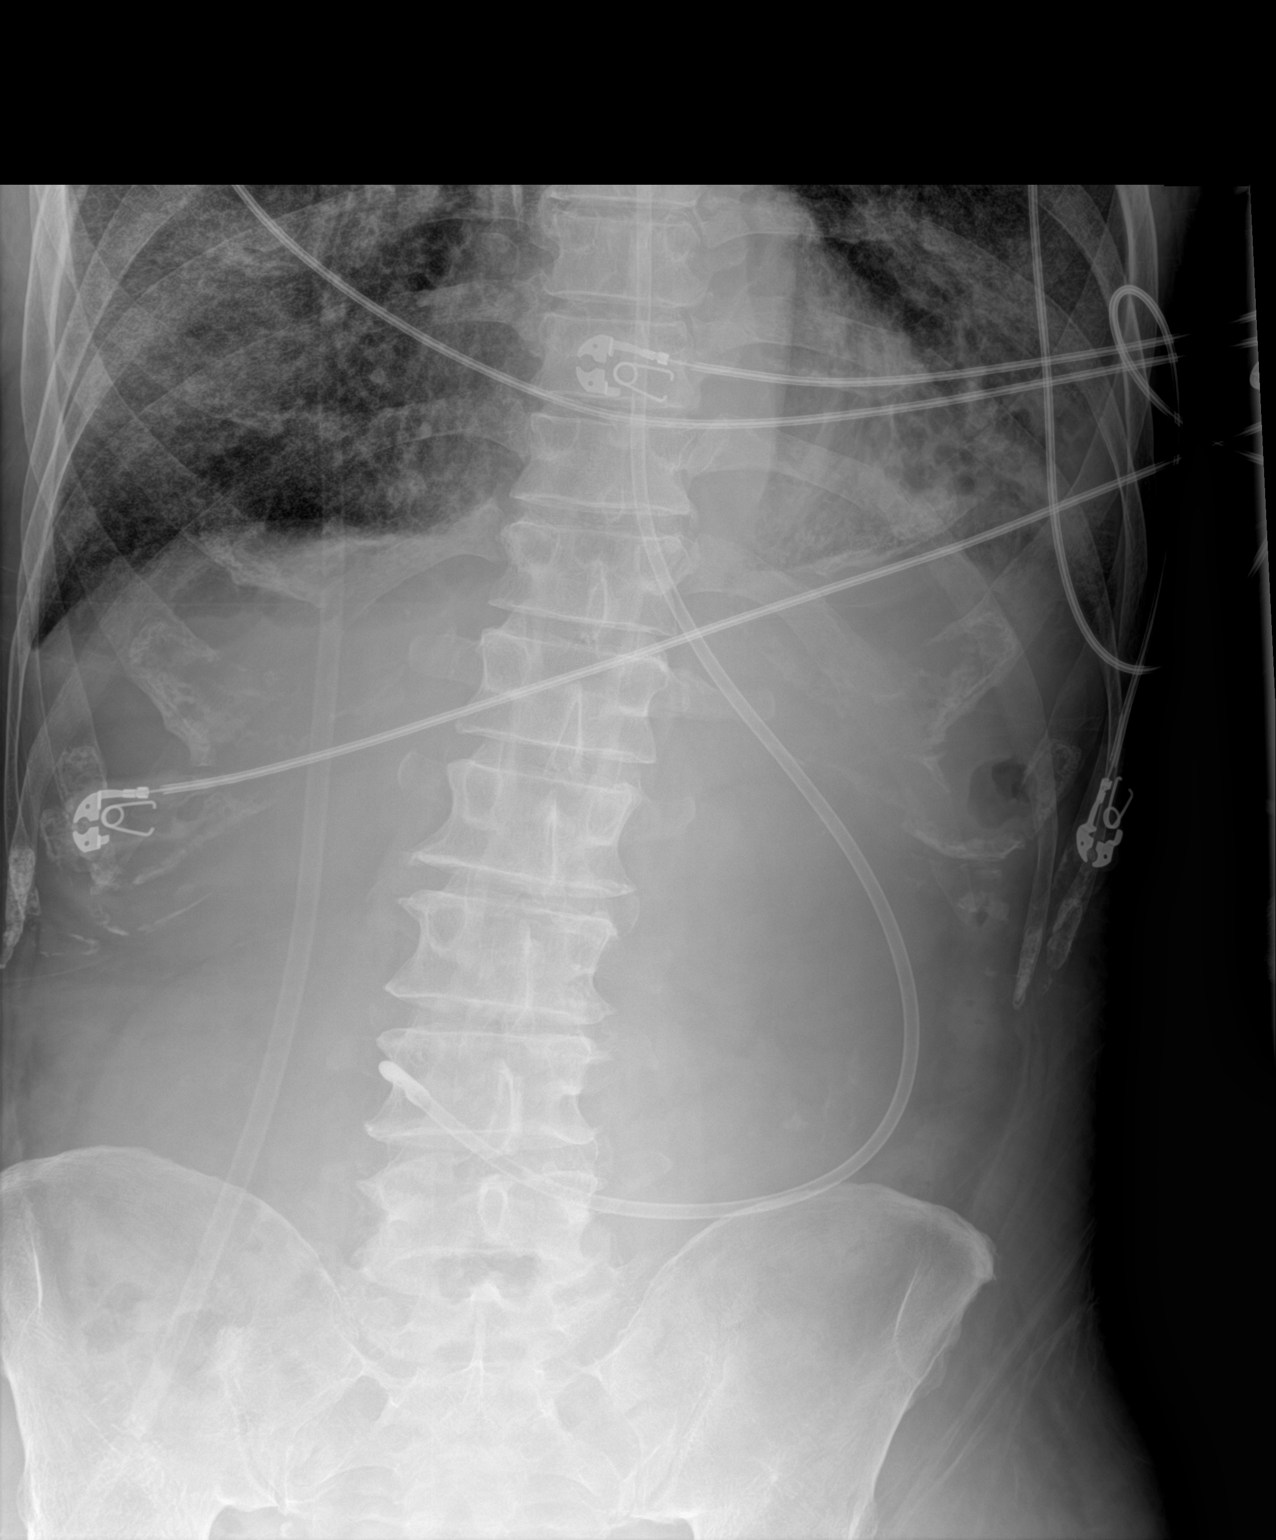

[1 of 1 positions shown; findings below may reference images not displayed]

FINDINGS: Feeding catheter is noted in the distal aspect of the stomach. No
free air is noted. Chronic changes are noted in the bases
bilaterally stable from previous exams. Previously noted some
catheter has been withdrawn.
IMPRESSION: Feeding catheter in stable position in the distal stomach.

## 2023-03-15 IMAGING — DX DG CHEST 1V PORT
1 series · 1 of 1 positions shown · non-contrast
Comparison: Previous studies including the examination done earlier
today.

CLINICAL DATA: Shortness of breath, pneumonia

EXAM:
PORTABLE CHEST 1 VIEW

[chest]
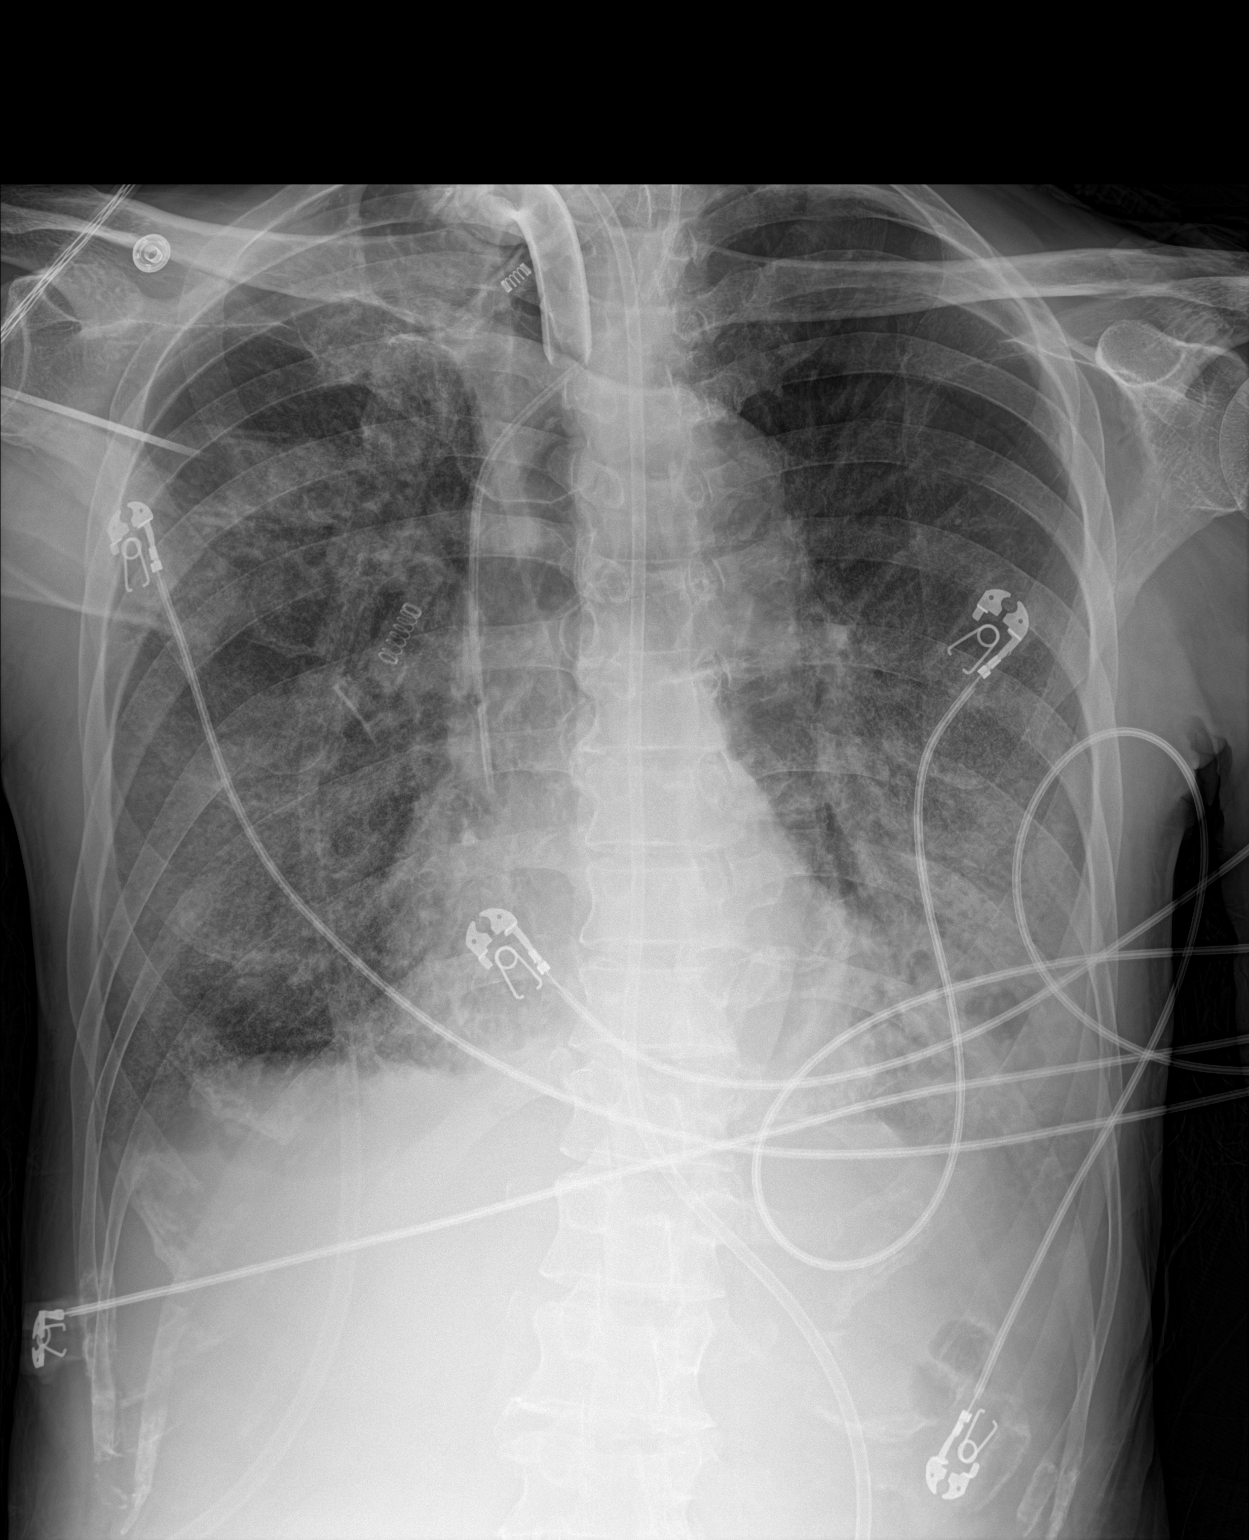

[1 of 1 positions shown; findings below may reference images not displayed]

FINDINGS: There is interval placement of tracheostomy with its tip
approximately 7.5 cm above the carina. Enteric tube is noted
traversing the esophagus. Tip of left IJ central venous catheter is
seen at the junction of superior vena cava and right atrium. Diffuse
interstitial infiltrates are seen in both lungs with interval
improvement. Left pleural effusion is present. There is no
pneumothorax.
IMPRESSION: There is interval placement of tracheostomy. There is interval
improvement in extensive infiltrates in both lungs suggesting
resolving pulmonary edema or resolving pneumonia. Left pleural
effusion has increased in size.

## 2023-03-16 IMAGING — DX DG ABD PORTABLE 1V
1 series · 1 of 1 positions shown · non-contrast
Comparison: 10/02/2021

CLINICAL DATA: NG tube placement

EXAM:
PORTABLE ABDOMEN - 1 VIEW

[abdomen kub]
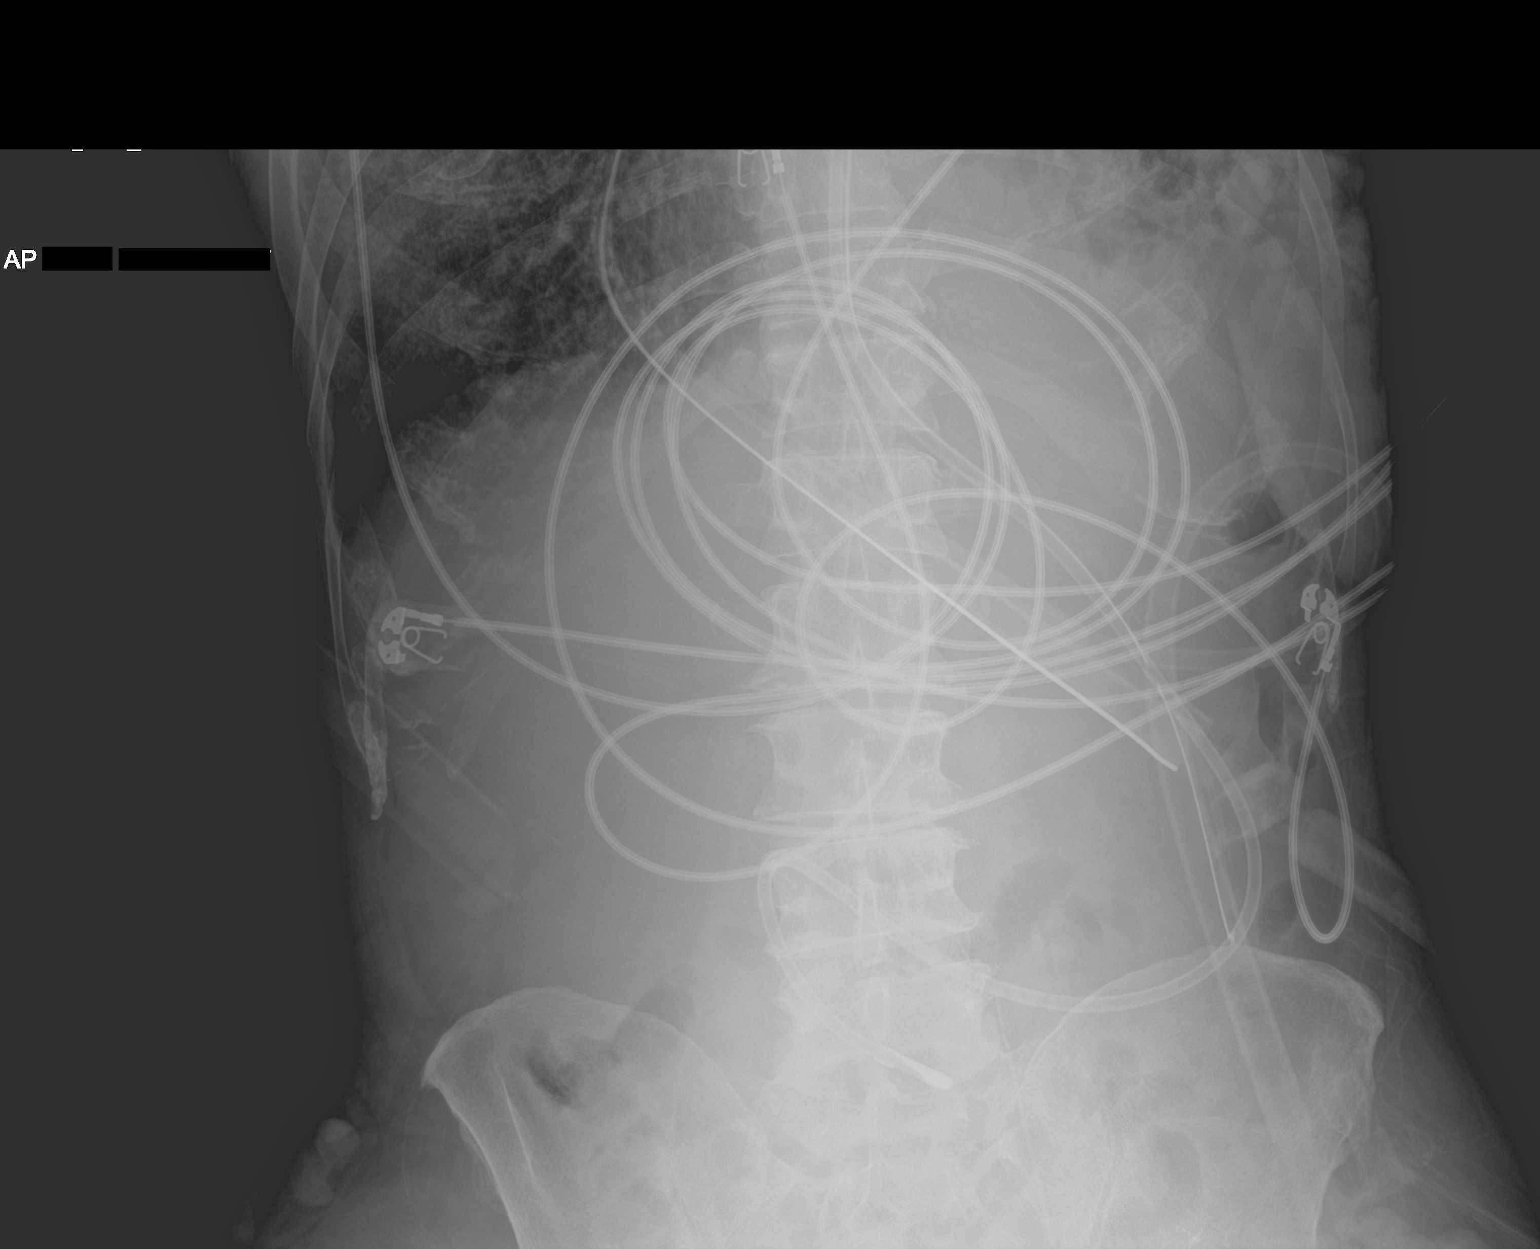

[1 of 1 positions shown; findings below may reference images not displayed]

FINDINGS: A nasal/orogastric tube passes below the diaphragm, tip in the mid
stomach in the left mid abdomen.

Enteric feeding tube also passes into the stomach, curling in the
right mid abdomen consistent with positioning of the tip in the
third portion of the duodenum, advanced since yesterday's exam.

Normal bowel gas pattern.

Persistent lung base opacities.
IMPRESSION: 1. New nasal/orogastric tube tip lies in the mid stomach.
2. Metallic tip of the enteric feeding tube now projects in the
expected location of the third portion of the duodenum.

## 2023-03-19 IMAGING — DX DG CHEST 1V PORT
1 series · 1 of 1 positions shown · non-contrast
Comparison: October 05, 2021.

CLINICAL DATA: Pneumonia.

EXAM:
PORTABLE CHEST 1 VIEW

[chest ap]
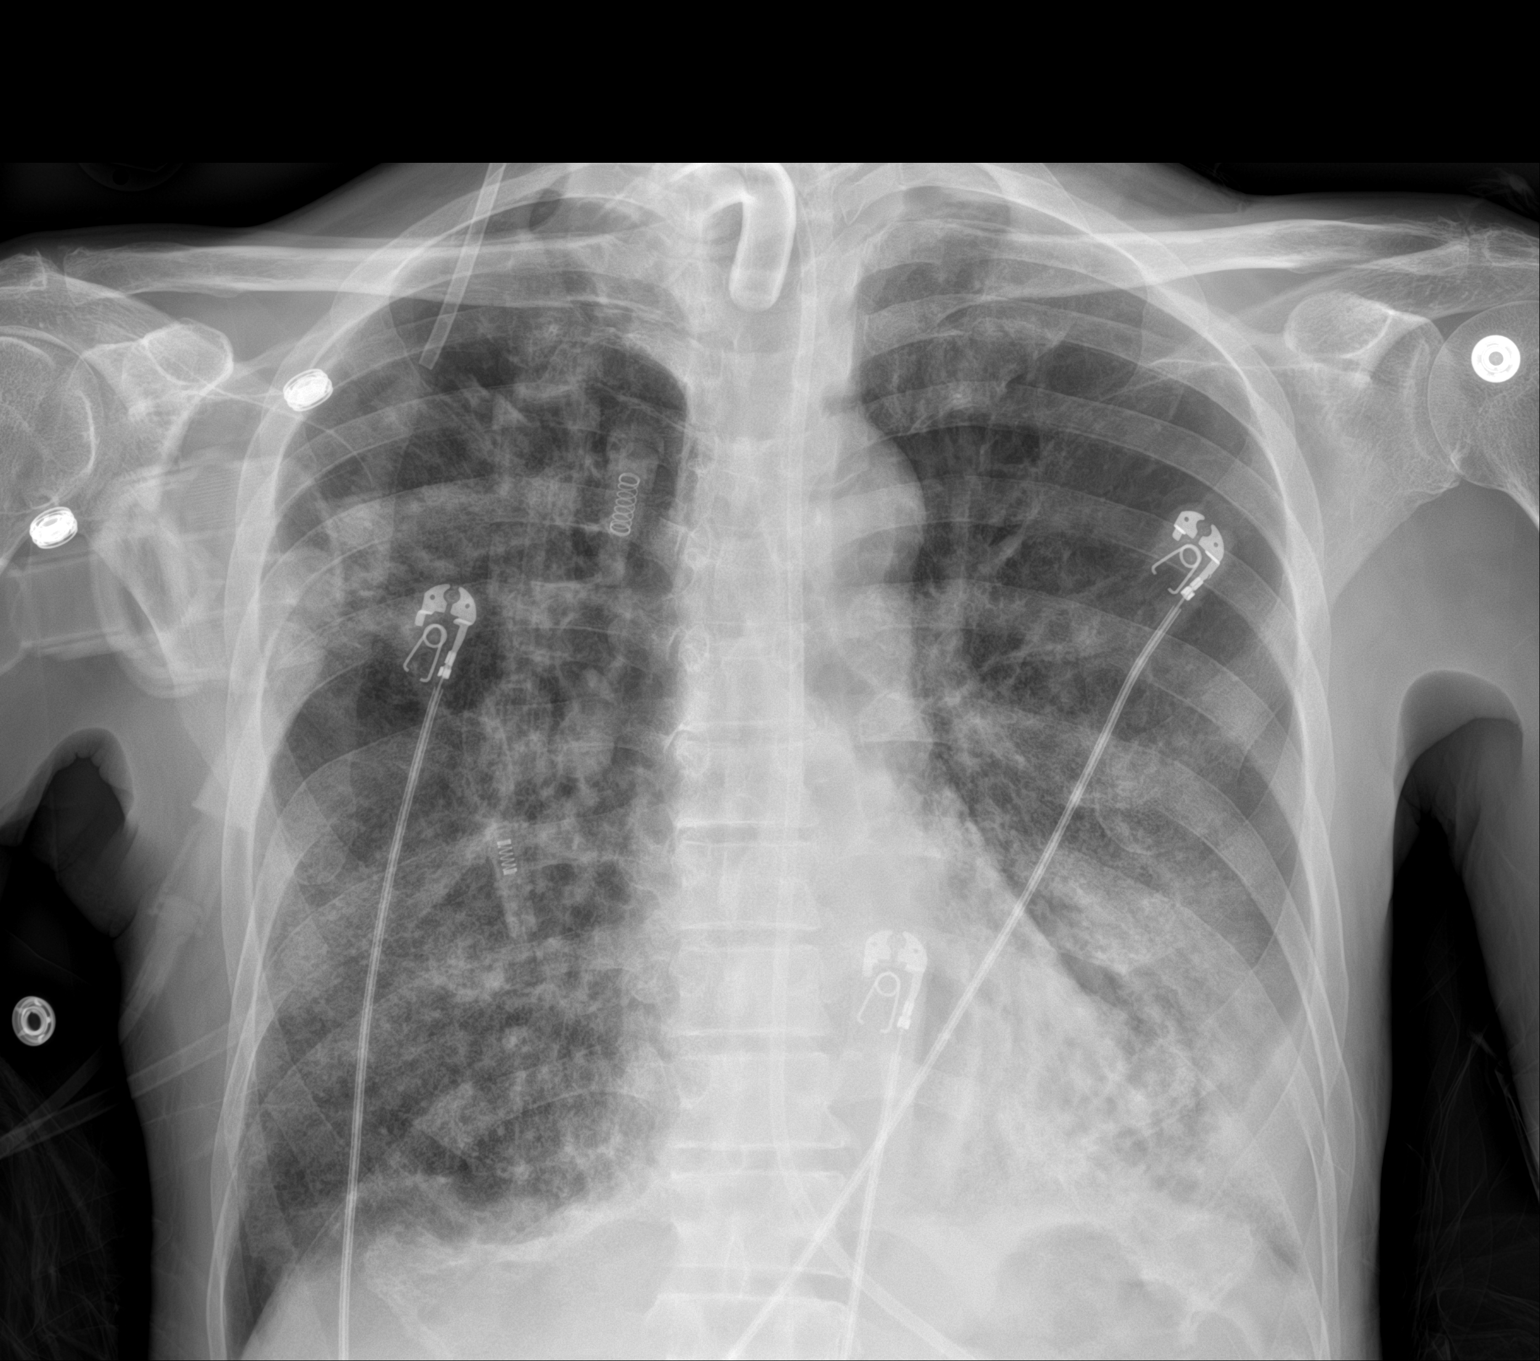

[1 of 1 positions shown; findings below may reference images not displayed]

FINDINGS: The heart size and mediastinal contours are within normal limits.
Tracheostomy and feeding tubes are unchanged. Stable bilateral lung
opacities are noted most prominently seen in the left lower lobe
concerning for multifocal pneumonia or possibly edema. The
visualized skeletal structures are unremarkable.
IMPRESSION: Stable support apparatus. Stable bilateral lung opacities as
described above.

## 2024-04-01 DEATH — deceased
